# Patient Record
Sex: Female | Born: 1960
Health system: Southern US, Community
[De-identification: ages and names within clinical notes are randomized; demographics above are authoritative.]

## PROBLEM LIST (undated history)

## (undated) DIAGNOSIS — F419 Anxiety disorder, unspecified: Secondary | ICD-10-CM

## (undated) DIAGNOSIS — N170 Acute kidney failure with tubular necrosis: Secondary | ICD-10-CM

## (undated) DIAGNOSIS — I10 Essential (primary) hypertension: Secondary | ICD-10-CM

## (undated) DIAGNOSIS — E7800 Pure hypercholesterolemia, unspecified: Secondary | ICD-10-CM

## (undated) DIAGNOSIS — R55 Syncope and collapse: Secondary | ICD-10-CM

## (undated) DIAGNOSIS — M797 Fibromyalgia: Secondary | ICD-10-CM

## (undated) DIAGNOSIS — M549 Dorsalgia, unspecified: Secondary | ICD-10-CM

## (undated) HISTORY — PX: HAND SURGERY: SHX662

## (undated) HISTORY — DX: Essential (primary) hypertension: I10

## (undated) HISTORY — PX: OOPHORECTOMY: SHX86

## (undated) HISTORY — DX: Acute kidney failure with tubular necrosis: N17.0

## (undated) HISTORY — DX: Fibromyalgia: M79.7

## (undated) HISTORY — DX: Syncope and collapse: R55

## (undated) HISTORY — DX: Pure hypercholesterolemia, unspecified: E78.00

## (undated) HISTORY — DX: Dorsalgia, unspecified: M54.9

---

## 1989-02-24 HISTORY — PX: TUBAL LIGATION: SHX77

## 1999-07-11 ENCOUNTER — Encounter: Payer: Self-pay | Admitting: Emergency Medicine

## 1999-07-11 ENCOUNTER — Emergency Department (HOSPITAL_COMMUNITY): Admission: EM | Admit: 1999-07-11 | Discharge: 1999-07-11 | Payer: Self-pay | Admitting: Emergency Medicine

## 1999-12-12 ENCOUNTER — Other Ambulatory Visit: Admission: RE | Admit: 1999-12-12 | Discharge: 1999-12-12 | Payer: Self-pay | Admitting: Unknown Physician Specialty

## 2003-04-07 ENCOUNTER — Encounter: Admission: RE | Admit: 2003-04-07 | Discharge: 2003-04-07 | Payer: Self-pay | Admitting: Internal Medicine

## 2003-05-22 ENCOUNTER — Emergency Department (HOSPITAL_COMMUNITY): Admission: EM | Admit: 2003-05-22 | Discharge: 2003-05-22 | Payer: Self-pay | Admitting: Family Medicine

## 2003-05-23 ENCOUNTER — Encounter: Admission: RE | Admit: 2003-05-23 | Discharge: 2003-05-23 | Payer: Self-pay | Admitting: Internal Medicine

## 2003-10-16 ENCOUNTER — Emergency Department (HOSPITAL_COMMUNITY): Admission: EM | Admit: 2003-10-16 | Discharge: 2003-10-16 | Payer: Self-pay | Admitting: Emergency Medicine

## 2003-11-18 ENCOUNTER — Emergency Department (HOSPITAL_COMMUNITY): Admission: EM | Admit: 2003-11-18 | Discharge: 2003-11-18 | Payer: Self-pay | Admitting: Emergency Medicine

## 2004-04-10 ENCOUNTER — Other Ambulatory Visit: Admission: RE | Admit: 2004-04-10 | Discharge: 2004-04-10 | Payer: Self-pay | Admitting: Internal Medicine

## 2004-05-26 ENCOUNTER — Emergency Department (HOSPITAL_COMMUNITY): Admission: EM | Admit: 2004-05-26 | Discharge: 2004-05-26 | Payer: Self-pay | Admitting: Family Medicine

## 2004-07-26 ENCOUNTER — Emergency Department (HOSPITAL_COMMUNITY): Admission: EM | Admit: 2004-07-26 | Discharge: 2004-07-26 | Payer: Self-pay | Admitting: Emergency Medicine

## 2004-08-23 ENCOUNTER — Encounter: Admission: RE | Admit: 2004-08-23 | Discharge: 2004-08-23 | Payer: Self-pay | Admitting: Internal Medicine

## 2005-02-25 ENCOUNTER — Emergency Department (HOSPITAL_COMMUNITY): Admission: EM | Admit: 2005-02-25 | Discharge: 2005-02-25 | Payer: Self-pay | Admitting: Family Medicine

## 2005-04-09 ENCOUNTER — Emergency Department (HOSPITAL_COMMUNITY): Admission: EM | Admit: 2005-04-09 | Discharge: 2005-04-09 | Payer: Self-pay | Admitting: Emergency Medicine

## 2005-08-21 ENCOUNTER — Encounter: Admission: RE | Admit: 2005-08-21 | Discharge: 2005-08-21 | Payer: Self-pay | Admitting: Internal Medicine

## 2005-09-04 ENCOUNTER — Encounter: Admission: RE | Admit: 2005-09-04 | Discharge: 2005-09-04 | Payer: Self-pay | Admitting: Internal Medicine

## 2005-09-21 ENCOUNTER — Emergency Department (HOSPITAL_COMMUNITY): Admission: EM | Admit: 2005-09-21 | Discharge: 2005-09-21 | Payer: Self-pay | Admitting: Emergency Medicine

## 2005-11-22 ENCOUNTER — Ambulatory Visit (HOSPITAL_COMMUNITY): Admission: RE | Admit: 2005-11-22 | Discharge: 2005-11-22 | Payer: Self-pay | Admitting: Neurology

## 2005-11-26 ENCOUNTER — Other Ambulatory Visit: Admission: RE | Admit: 2005-11-26 | Discharge: 2005-11-26 | Payer: Self-pay | Admitting: Gynecology

## 2005-12-06 ENCOUNTER — Ambulatory Visit (HOSPITAL_COMMUNITY): Admission: RE | Admit: 2005-12-06 | Discharge: 2005-12-06 | Payer: Self-pay | Admitting: Neurology

## 2005-12-25 HISTORY — PX: HYSTEROSCOPY: SHX211

## 2006-01-19 ENCOUNTER — Encounter (INDEPENDENT_AMBULATORY_CARE_PROVIDER_SITE_OTHER): Payer: Self-pay | Admitting: Specialist

## 2006-01-19 ENCOUNTER — Ambulatory Visit (HOSPITAL_BASED_OUTPATIENT_CLINIC_OR_DEPARTMENT_OTHER): Admission: RE | Admit: 2006-01-19 | Discharge: 2006-01-19 | Payer: Self-pay | Admitting: Gynecology

## 2006-04-10 ENCOUNTER — Emergency Department (HOSPITAL_COMMUNITY): Admission: EM | Admit: 2006-04-10 | Discharge: 2006-04-10 | Payer: Self-pay | Admitting: Emergency Medicine

## 2006-05-01 ENCOUNTER — Encounter (INDEPENDENT_AMBULATORY_CARE_PROVIDER_SITE_OTHER): Payer: Self-pay | Admitting: Specialist

## 2006-05-01 ENCOUNTER — Ambulatory Visit (HOSPITAL_COMMUNITY): Admission: RE | Admit: 2006-05-01 | Discharge: 2006-05-01 | Payer: Self-pay | Admitting: Gastroenterology

## 2006-09-07 ENCOUNTER — Encounter: Admission: RE | Admit: 2006-09-07 | Discharge: 2006-09-07 | Payer: Self-pay | Admitting: Internal Medicine

## 2006-11-08 ENCOUNTER — Emergency Department (HOSPITAL_COMMUNITY): Admission: EM | Admit: 2006-11-08 | Discharge: 2006-11-08 | Payer: Self-pay | Admitting: Emergency Medicine

## 2006-12-04 ENCOUNTER — Other Ambulatory Visit: Admission: RE | Admit: 2006-12-04 | Discharge: 2006-12-04 | Payer: Self-pay | Admitting: Gynecology

## 2007-02-22 ENCOUNTER — Ambulatory Visit (HOSPITAL_COMMUNITY): Admission: RE | Admit: 2007-02-22 | Discharge: 2007-02-23 | Payer: Self-pay | Admitting: Gynecology

## 2007-02-22 ENCOUNTER — Encounter: Payer: Self-pay | Admitting: Gynecology

## 2007-06-30 ENCOUNTER — Encounter: Admission: RE | Admit: 2007-06-30 | Discharge: 2007-06-30 | Payer: Self-pay | Admitting: Internal Medicine

## 2007-11-09 ENCOUNTER — Encounter: Admission: RE | Admit: 2007-11-09 | Discharge: 2007-11-09 | Payer: Self-pay | Admitting: Gynecology

## 2007-12-31 ENCOUNTER — Other Ambulatory Visit: Admission: RE | Admit: 2007-12-31 | Discharge: 2007-12-31 | Payer: Self-pay | Admitting: Gynecology

## 2007-12-31 ENCOUNTER — Ambulatory Visit: Payer: Self-pay | Admitting: Gynecology

## 2007-12-31 ENCOUNTER — Encounter: Payer: Self-pay | Admitting: Gynecology

## 2008-01-25 HISTORY — PX: VAGINAL HYSTERECTOMY: SUR661

## 2008-02-08 ENCOUNTER — Ambulatory Visit: Payer: Self-pay | Admitting: Gynecology

## 2008-05-04 ENCOUNTER — Encounter: Admission: RE | Admit: 2008-05-04 | Discharge: 2008-05-04 | Payer: Self-pay | Admitting: Internal Medicine

## 2008-05-25 ENCOUNTER — Ambulatory Visit (HOSPITAL_COMMUNITY): Admission: RE | Admit: 2008-05-25 | Discharge: 2008-05-25 | Payer: Self-pay | Admitting: Internal Medicine

## 2008-05-25 ENCOUNTER — Ambulatory Visit: Payer: Self-pay | Admitting: Sports Medicine

## 2008-05-25 DIAGNOSIS — M84376A Stress fracture, unspecified foot, initial encounter for fracture: Secondary | ICD-10-CM

## 2008-05-25 DIAGNOSIS — M7710 Lateral epicondylitis, unspecified elbow: Secondary | ICD-10-CM | POA: Insufficient documentation

## 2008-05-25 HISTORY — DX: Stress fracture, unspecified foot, initial encounter for fracture: M84.376A

## 2008-06-16 ENCOUNTER — Ambulatory Visit (HOSPITAL_COMMUNITY): Admission: RE | Admit: 2008-06-16 | Discharge: 2008-06-16 | Payer: Self-pay | Admitting: Internal Medicine

## 2008-07-13 ENCOUNTER — Ambulatory Visit: Payer: Self-pay | Admitting: Sports Medicine

## 2008-08-03 ENCOUNTER — Ambulatory Visit: Payer: Self-pay | Admitting: Sports Medicine

## 2008-08-31 ENCOUNTER — Ambulatory Visit: Payer: Self-pay | Admitting: Sports Medicine

## 2008-10-19 ENCOUNTER — Ambulatory Visit: Payer: Self-pay | Admitting: Sports Medicine

## 2008-11-10 ENCOUNTER — Encounter: Admission: RE | Admit: 2008-11-10 | Discharge: 2008-11-10 | Payer: Self-pay | Admitting: Gynecology

## 2009-01-03 ENCOUNTER — Ambulatory Visit: Payer: Self-pay | Admitting: Gynecology

## 2009-01-03 ENCOUNTER — Encounter: Payer: Self-pay | Admitting: Gynecology

## 2009-01-03 ENCOUNTER — Other Ambulatory Visit: Admission: RE | Admit: 2009-01-03 | Discharge: 2009-01-03 | Payer: Self-pay | Admitting: Gynecology

## 2009-01-04 ENCOUNTER — Ambulatory Visit: Payer: Self-pay | Admitting: Sports Medicine

## 2009-03-06 ENCOUNTER — Emergency Department (HOSPITAL_COMMUNITY): Admission: EM | Admit: 2009-03-06 | Discharge: 2009-03-06 | Payer: Self-pay | Admitting: Emergency Medicine

## 2009-06-25 ENCOUNTER — Encounter: Admission: RE | Admit: 2009-06-25 | Discharge: 2009-07-03 | Payer: Self-pay | Admitting: Internal Medicine

## 2009-11-05 ENCOUNTER — Encounter: Admission: RE | Admit: 2009-11-05 | Discharge: 2009-11-23 | Payer: Self-pay | Admitting: Internal Medicine

## 2009-11-12 ENCOUNTER — Encounter: Admission: RE | Admit: 2009-11-12 | Discharge: 2009-11-12 | Payer: Self-pay | Admitting: Gynecology

## 2010-01-14 ENCOUNTER — Ambulatory Visit: Payer: Self-pay | Admitting: Gynecology

## 2010-01-14 ENCOUNTER — Other Ambulatory Visit: Admission: RE | Admit: 2010-01-14 | Discharge: 2010-01-14 | Payer: Self-pay | Admitting: Gynecology

## 2010-01-16 ENCOUNTER — Ambulatory Visit (HOSPITAL_COMMUNITY): Admission: RE | Admit: 2010-01-16 | Discharge: 2010-01-16 | Payer: Self-pay | Admitting: Gastroenterology

## 2010-03-17 ENCOUNTER — Encounter: Payer: Self-pay | Admitting: Internal Medicine

## 2010-06-04 ENCOUNTER — Inpatient Hospital Stay (INDEPENDENT_AMBULATORY_CARE_PROVIDER_SITE_OTHER)
Admission: RE | Admit: 2010-06-04 | Discharge: 2010-06-04 | Disposition: A | Discharge status: Home / Self Care | Payer: 59 | Source: Ambulatory Visit | Attending: Family Medicine | Admitting: Family Medicine

## 2010-06-04 ENCOUNTER — Ambulatory Visit (INDEPENDENT_AMBULATORY_CARE_PROVIDER_SITE_OTHER): Payer: 59

## 2010-06-04 DIAGNOSIS — M549 Dorsalgia, unspecified: Secondary | ICD-10-CM

## 2010-06-04 DIAGNOSIS — IMO0001 Reserved for inherently not codable concepts without codable children: Secondary | ICD-10-CM

## 2010-06-04 DIAGNOSIS — M799 Soft tissue disorder, unspecified: Secondary | ICD-10-CM

## 2010-07-09 NOTE — Op Note (Signed)
[name], [name]              ACCOUNT NO.:  [id number]   MEDICAL RECORD NO.:  [id number]          PATIENT TYPE:  AMB   LOCATION:  SDC                           FACILITY:  [facility]   PHYSICIAN:  Timothy P. Fontaine, M.D.DATE OF BIRTH:  10/16/1960   DATE OF PROCEDURE:  02/22/2007  DATE OF DISCHARGE:                               OPERATIVE REPORT   PREOPERATIVE DIAGNOSES:  Menorrhagia, dysmenorrhea, leiomyoma and  endometrial polyps.   POSTOPERATIVE DIAGNOSES:  Menorrhagia, dysmenorrhea, leiomyoma and  endometrial polyps.   PROCEDURE:  Laparoscopic-assisted vaginal hysterectomy, bilateral  salpingo-oophorectomy.   SURGEON:  Timothy P. Fontaine, M.D.   ASSISTANTGaetano Hawthorne. Lily Peer, M.D.   ANESTHESIA:  General.   ESTIMATED BLOOD LOSS:  Less than 100 mL.   COMPLICATIONS:  None.   SPECIMEN:  Uterus, bilateral fallopian tubes, bilateral ovaries.   FINDINGS:  EUA external BUS vagina normal.  The cervix grossly normal.  Bimanual uterus enlarged, midline and mobile.  Adnexa without gross  masses.  Laparoscopic uterus enlarged, boggy.  Right and left fallopian  tubes normal length caliber fimbriated ends.  Right and left ovaries  grossly normal, free and mobile.  The pelvis without evidence of  endometriosis or pelvic adhesive disease.  Upper abdominal exam is  grossly normal.  The appendix was noted to be normal, free and mobile.  Liver is smooth.  The gallbladder not visualized.   PROCEDURE:  The patient was taken to the operating room, underwent  general anesthesia and was placed in the low dorsal lithotomy position.  The patient received abdominal perineal vaginal preparation with  Betadine solution per nursing personnel.  An indwelling Foley catheter  was placed in sterile technique.  EUA was performed.  The Hulka  tenaculum was placed through the cervix and the patient was draped in  the usual fashion.  A transverse infraumbilical incision was made.  The  10-mm OptiView  direct entry laparoscope was then placed under direct  visualization without difficulty and the abdomen was subsequently  insufflated without difficulty.  Right and left 5-mm suprapubic ports  were then placed under direct visualization, after transillumination for  the vessels without difficulty.  Examination of pelvic organs, upper  abdominal exam was carried out with findings noted above.  Using the  harmonic scalpel, the right infundibulopelvic ligament and vessels were  identified.  The pelvic sidewall anatomy reviewed.  The ureter was found  to be far from the operative field and the infundibulopelvic ligament  and vessels were then transected and after two clipping passes with the  harmonic scalpel.  The right adnexa was then freed from its attachments  with the harmonic scalpel, without difficulty to the level of the round  ligament.  The round ligament was then transected and the anterior  vesicouterine peritoneal fold was sharply developed to the midline.  A  similar procedure was carried out on the other side.  Attention was then  turned to the vaginal portion of the case.  The patient was placed in  the high dorsal lithotomy position.  The Hulka tenaculum was removed.  The cervix visualized with a  weighted speculum.  Cervix grasped with  tenaculum and the cervical mucosa was circumferentially injected using  lidocaine epinephrine mixture.  A total of 10 mL.  The cervical mucosa  was then circumferentially sharply incised and paracervical planes  sharply developed.  The posterior cul-de-sac was sharply entered.  A  long weighted speculum was placed.  The right and left uterosacral  ligaments were then identified, clamped, cut and ligated using 0 Vicryl  suture and tagged for future reference.  Uterus was progressively freed  from its attachments through development of the vesicouterine plane and  clamping, cutting and ligating of the paracervical parametrial tissues  using 0  Vicryl suture.  Right and left the uterine vessels were noted  during this process and were ligated using 0 Vicryl suture.  At this  point, the uterus was then delivered through the vagina and any  remaining parametrial tissues were clamped, cut and subsequently ligated  using 0 Vicryl suture and the uterus was freed from the patient, along  with the fallopian tube and ovary bilaterally.  The long weighted  speculum was replaced with a short weighted speculum.  The posterior  vaginal cuff grasped with an Allis clamp.  The intestines packed with a  tagged tail sponge.  The pelvis was irrigated.  Adequate hemostasis was  grossly visualized.  The posterior vaginal mucosa was then run from  uterosacral ligament to uterosacral ligament, using 0 Vicryl suture in a  running interlocking stitch.  The tail sponge was removed and the vagina  was closed anterior-posterior using 0 Vicryl suture in figure-of-eight  interrupted stitch.  The vagina was irrigated.  Hemostasis visualized.  The urine noted to be clear yellow free-flowing.  Attention was then  redirected to the laparoscopic portion.  The abdomen was reinsufflated.  The pelvis was copiously irrigated.  Several small oozing points, at the  peritoneal surfaces, at the cuff were bipolar cauterized.  Surgicel was  placed at the cuff to assure continued hemostasis.  The pressure was  slowly allowed to escape.  Adequate hemostasis was visualized at a low-  pressure situation.  Both suprapubic 5-mm ports were then removed.  Again, hemostasis was visualized and the infraumbilical port was then  backed out under direct visualization, showing adequate hemostasis and  no evidence of hernia formation.  A 0 Vicryl interrupted subcutaneous  fascial stitch was placed infraumbilically.  All skin incisions were  injected using 0.25% Marcaine and all skin incisions closed using 4-0  Vicryl in an interrupted cuticular stitch.  Sterile dressings were  applied.   The patient was placed in a supine position and awakened  without difficulty and taken to the recovery room in good condition,  having tolerated the procedure well.      Timothy P. Fontaine, M.D.  Electronically Signed     TPF/MEDQ  D:  02/22/2007  T:  02/22/2007  Job:  [id number]

## 2010-07-09 NOTE — H&P (Signed)
[name], [name]              ACCOUNT NO.:  [id number]   MEDICAL RECORD NO.:  [id number]          PATIENT TYPE:  AMB   LOCATION:  SDC                           FACILITY:  [facility]   PHYSICIAN:  Timothy P. Fontaine, M.D.DATE OF BIRTH:  02/11/1961   DATE OF ADMISSION:  DATE OF DISCHARGE:                              HISTORY & PHYSICAL   REASON FOR ADMISSION:  The patient is being admitted to [facility]  February 22, 2007 at 7:30 a.m. for surgery.   CHIEF COMPLAINT:  Increasing menorrhagia, dysmenorrhea and recurrent  endometrial polyps.   HISTORY OF PRESENT ILLNESS:  A 50 year old G6, P4, AB 2 female with a  history of increasing menorrhagia and dysmenorrhea.  She underwent  sonohystogram which confirmed leiomyoma as well as 2 endometrial polyps.  She is admitted at this time for LAVH/BSO.  Patient is being followed  for hypertension, hypercholesterolemia, fibromyalgia.  She has undergone  a preoperative evaluation and clearance by her primary Dr. Allyne Gee and  her Dr. Jacinto Halim at  [facility].   PAST MEDICAL HISTORY:  1. Hypercholesterolemia.  2. Hypertension.  3. Fibromyalgia.   PAST SURGICAL HISTORY:  1. Hysteroscopy.  2. D&C.  3. Tubal sterilization.   CURRENT MEDICATIONS:  1. Cymbalta.  2. Crestor.  3. Lisinopril.  Dosages per her medication reconciliation sheet.   ALLERGIES:  NO MEDICATIONS.   SOCIAL HISTORY:  Significant for 1 pack per day cigarette smoking x 20  years.   FAMILY HISTORY:  Noncontributory   REVIEW OF SYSTEMS:  Noncontributory.   PHYSICAL EXAMINATION:  VITAL SIGNS:  Stable.  Afebrile.  HEENT:  Normal.  LUNGS:  Clear.  CARDIAC:  Regular rate.  No rubs, murmurs or gallops.  ABDOMINAL:  Benign.  PELVIC:  External BUS, vagina normal.  Cervix normal.  Uterus normal  size, midline mobile, nontender.  Adnexa without masses or tenderness.   ASSESSMENT:  A 50 year old G6, P4, AB 2 female increasing menorrhagia,  dysmenorrhea,  recurrent endometrial polyps for LAVH/BSO.  The risks,  benefits, indications and alternatives for the surgery were reviewed  with the patient.  More conservative options were reviewed and she wants  to proceed with hysterectomy.  Ovarian conservation issue was discussed.  The patient understands by removing both ovaries will remove estrogen  hormone production.  The symptoms and potential for hypoestrogenism long-  term risks were reviewed with her.  The patient understands she may  become significantly symptomatic. The issues of ERT were discussed with  her.  The risks and benefits of WHI study, increased risk of stroke,  heart attack, DVT, as well as possible breast cancer issues associated  with ERT were all reviewed, understood and accepted, and she would start  ERT if symptoms warrant.  Options for keeping both ovaries were  discussed.  The risks in keeping both ovaries to include benign ovarian  disease in the future as well as ovarian cancer weighed against the  continued hormone production was discussed and again she wants to have  both ovaries removed.  Absolute irreversible sterility associated with  hysterectomy was also discussed, understood and accepted.  Sexuality  associated with hysterectomy was reviewed and the risk for persistent  orgasmic dysfunction or persistent dyspareunia following the procedure  was discussed, understood and accepted.   The intraoperative course was reviewed with her.  We will plan on an  LAVH and the instrumentation to include trocar placement, multiple port  sites, insufflation was reviewed.  She understands that any time during  the procedure that we may abandon the laparoscopic approach if  complications arise or if felt unsafe to proceed and that we may switch  to an abdominal hysterectomy.  She understands and accepts this.  The  risks of hysterectomy acutely were reviewed to include the general risks  of surgery including deep venous  thrombosis, pulmonary embolus, stroke,  heart attack in the perioperative period as well as the risks of the  surgery to include infection, prolonged antibiotics, abscess formation  requiring re-operation, abscess drainage, wound complications both  abscess formation requiring opening and draining of incisions, closure  by secondary intention as well as long-term incisional risks such as  hernia formation.  The risk of hemorrhage necessitating transfusion and  risks of transfusion were reviewed to include transfusion reaction,  hepatitis, HIV, mad cow disease and other unknown entities.  The risks  of inadvertent injury to internal organs including bowel, bladder,  ureters, vessels and nerves were reviewed with the patient either  immediately recognized or delay recognized necessitating major  exploratory reparative surgeries, future reparative surgeries, bowel  resection, ostomy formation was all discussed, understood and accepted.  The patient's questions were answered to her satisfaction.  She is ready  to proceed with surgery.      Timothy P. Fontaine, M.D.  Electronically Signed     TPF/MEDQ  D:  02/12/2007  T:  02/13/2007  Job:  [id number]

## 2010-07-09 NOTE — Discharge Summary (Signed)
[name], [name]              ACCOUNT NO.:  [id number]   MEDICAL RECORD NO.:  [id number]          PATIENT TYPE:  OIB   LOCATION:  9317                          FACILITY:  [facility]   PHYSICIAN:  Timothy P. Fontaine, M.D.DATE OF BIRTH:  07-03-1960   DATE OF ADMISSION:  02/22/2007  DATE OF DISCHARGE:  02/23/2007                               DISCHARGE SUMMARY   DISCHARGE DIAGNOSES:  Menorrhagia, dysmenorrhea, leiomyoma, endometrial  polyps.   PROCEDURE:  LAVH BSO February 22, 2007, Dr. Colin Broach.  Pathology  pending.   HOSPITAL COURSE:  A 50 year old female underwent uncomplicated LAVH BSO  February 22, 2007.  Findings at the time of surgery was an enlarged  uterus with normal-appearing adnexa.  The patient's postoperative course  was uncomplicated.  She was discharged on postoperative day #1,  ambulating well, tolerating a regular diet with a postoperative  hemoglobin of 11.2.  The patient received precautions, instructions and  follow-up and will be seen in the office 2 weeks following discharge.  She received a prescription for Tylox #25 one to two p.o. q.4-6h p.r.n.  pain.      Timothy P. Fontaine, M.D.  Electronically Signed     TPF/MEDQ  D:  02/23/2007  T:  02/23/2007  Job:  [id number]

## 2010-07-12 NOTE — Op Note (Signed)
[name], [name]              ACCOUNT NO.:  [id number]   MEDICAL RECORD NO.:  [id number]          PATIENT TYPE:  AMB   LOCATION:  NESC                         FACILITY:  [facility]   PHYSICIAN:  Timothy P. Fontaine, M.D.DATE OF BIRTH:  04/10/1960   DATE OF PROCEDURE:  01/19/2006  DATE OF DISCHARGE:                                 OPERATIVE REPORT   PREOPERATIVE DIAGNOSIS:  Menorrhagia with endometrial polyps.   POSTOPERATIVE DIAGNOSIS:  Menorrhagia with endometrial polyps.   PROCEDURE:  1. Hysteroscopy.  2. Dilatation and curettage.  3. Paracervical block, 1% lidocaine.   SURGEON:  Timothy P. Fontaine, MD.   ANESTHETIC:  General with 1% lidocaine paracervical block.   SPECIMEN:  Endometrial curetting.   ESTIMATED BLOOD LOSS:  Minimal.   SORBITOL DISCREPANCY:  Approximately 150 mL.   COMPLICATIONS:  None.   FINDINGS:  EUA:  External, BUS, vagina normal.  Cervix normal.  Uterus  normal size, midline and mobile.  Adnexa without masses.  Hysteroscopic:  Undulating anterior endometrial thickening, no distinct polyps.  Fundus,  anterior-posterior surfaces, lower uterine segment, endocervical canal,  right and left tubal ostia all visualized.   PROCEDURE:  The patient was taken to the operating room, underwent general  anesthesia, was placed in the low dorsal lithotomy position, received a  perineal and vaginal preparation with Betadine solution.  Bladder emptied  with in-and-out Foley catheterization.  EUA performed.  The patient draped  in the usual fashion.  Cervix was visualized with a speculum, anterior lip  grasped with single-tooth tenaculum.  Paracervical block using 1% lidocaine  was placed, a total of 8 mL.  The cervix was gently gradually dilated to  admit the operative hysteroscope and hysteroscopy was performed with  findings noted above.  A sharp curettage was then performed.  The specimen  sent  to pathology.  Re-hysteroscopy showed good distension, no evidence  of  perforation, and an empty endometrial cavity.  The instruments were removed.  Hemostasis visualized.  The patient placed in supine position, awakened  without difficulty, and taken to the recovery room in good condition, having  tolerated the procedure well.      Timothy P. Fontaine, M.D.  Electronically Signed     TPF/MEDQ  D:  01/19/2006  T:  01/19/2006  Job:  [id number]
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
## 2010-07-12 NOTE — H&P (Signed)
[name], [name]              ACCOUNT NO.:  [id number]   MEDICAL RECORD NO.:  [id number]          PATIENT TYPE:  AMB   LOCATION:  NESC                         FACILITY:  [facility]   PHYSICIAN:  Timothy P. Fontaine, M.D.DATE OF BIRTH:  22-Nov-1960   DATE OF ADMISSION:  01/19/2006  DATE OF DISCHARGE:                                HISTORY & PHYSICAL   CHIEF COMPLAINT:  Menorrhagia.   HISTORY OF PRESENT ILLNESS:  A 50 year old G6, P4 female with a history of  increasing menorrhagia status post tubal sterilization.  The patient  underwent sonohystogram which showed anterior endometrial polyp and is  admitted at this time for hysteroscopic evaluation and resection of her  polyps, D&C.   PAST MEDICAL HISTORY:  Uncomplicated.   PAST SURGICAL HISTORY:  Tubal sterilization.   REVIEW OF SYSTEMS:  Noncontributory.   FAMILY HISTORY:  Noncontributory.   SOCIAL HISTORY:  Noncontributory.   PHYSICAL EXAMINATION:  VITAL SIGNS:  Afebrile.  Vital signs stable.  HEENT: Normal.  LUNGS:  Clear.  CARDIAC:  Regular rate, no murmurs, rubs, or gallops.  ABDOMEN:  Benign.  PELVIC:  External BUS and vagina normal.  Cervix normal.  Uterus normal  size, midline mobile, nontender.  Adnexa without mass or tenderness.   ASSESSMENT:  A 50 year old G6, P4 female status post tubal sterilization,  increasing menorrhagia, periods lasting 7 days, underwent sonohystogram  which showed several small myomas intramural as well as a irregular  endometrial cavity with a large anterior polyp measuring 14 x 14 x 11 mm.  She is admitted at this time for hysteroscopy D&C with removal of the  polyps.  I reviewed with the patient what is involved with the procedure,  expected intraoperative/postoperative courses, use of the hysteroscope and  the resectoscope.  I reviewed the risks of infection, possible subsequent  need for antibiotics.  The risks of hemorrhage necessitating transfusion and  risks of transfusion.  I  reviewed the risks of uterine perforation, internal  organ damage including bowel, bladder, ureters, vessels and nerves  necessitating major exploratory reparative surgeries and future reparative  surgeries including ostomy formation.  Either intermediate recognition or  delay recognition postop was discussed, understood and accepted.  The issues  with distended medium, absorption, metabolic complications, coma and  seizures was all discussed.  The  patient's questions were answered to her satisfaction.  She understands that  there are no guarantees as far as her menorrhagia relief as well as her  dysmenorrhea relief and she understands and accepts this.  The patient's  questions were answered and she is ready to proceed with surgery.      Timothy P. Fontaine, M.D.  Electronically Signed     TPF/MEDQ  D:  01/13/2006  T:  01/13/2006  Job:  [id number]

## 2010-07-12 NOTE — Op Note (Signed)
NAMEKENESHA, [name]              ACCOUNT NO.:  [id number]   MEDICAL RECORD NO.:  [id number]          PATIENT TYPE:  AMB   LOCATION:  ENDO                         FACILITY:  [facility]   PHYSICIAN:  Anselmo Rod, M.D.  DATE OF BIRTH:  12/06/1960   DATE OF PROCEDURE:  05/01/2006  DATE OF DISCHARGE:                               OPERATIVE REPORT   PROCEDURE PERFORMED:  Esophagogastroduodenoscopy with multiple cold  biopsies.   ENDOSCOPIST:  Anselmo Rod, MD   INSTRUMENT USED:  Pentax video panendoscope.   INDICATIONS FOR PROCEDURE:  [age]-year-old African American female  undergoing EGD for epigastric pain and difficulty swallowing, nausea and  longstanding history of reflux, rule out esophagitis, peptic ulcer  disease, Barrett's esophagus, etc.   PREPROCEDURE PREPARATION:  Informed consent was procured from the  patient.  The patient was fasted for 8 hours prior to the procedure.  Risks and benefits of the procedure were discussed with the patient in  great detail.   PREPROCEDURE PHYSICAL:  VITAL SIGNS:  The patient had stable vital  signs.  NECK:  Supple.  CHEST:  Clear to auscultation.  CARDIAC:  S1 and S2 regular.  ABDOMEN:  Soft with normal bowel sounds.  Epigastric tenderness on  palpation, no guarding or rebound, or rigidity.   DESCRIPTION OF PROCEDURE:  The patient was placed in the left lateral  decubitus position and sedated with 100 mcg of fentanyl and 7 mg of  Versed given intravenously in slow incremental doses. Once the patient  was adequately sedated and maintained on low-flow oxygen and continuous  cardiac monitoring, the Pentax video panendoscope was advanced through  the mouthpiece, over the tongue and into the esophagus under direct  vision.  The entire esophagus was widely patent with no evidence of  ring, stricture, esophagitis, etc. A small patch of pinkish mucosa was  noted above the Z-line that may represent a Barrett's esophagus; 1  biopsy was  taken from this area.  Retroflexion in the high cardia  revealed no abnormalities.  There was evidence of moderate diffuse  gastritis.  Antral biopsies were done to rule out presence of H. pylori  by Pathology.  The proximal small bowel appeared normal.  There was no  outlet obstruction.  The patient tolerated the procedure well without  immediate complications.   IMPRESSION:  1. Small patch of pinkish mucosa above the Z-line, biopsied to rule      out Barrett's esophagus.  2. Moderate diffuse gastritis, biopsies done for Helicobacter pylori.  3. Normal proximal small bowel.   RECOMMENDATIONS:  1. Await pathology results.  2. Continue PPIs.  3. Treat with antibiotics if H. pylori present on biopsies.  4. Outpatient followup in the next 2 weeks for further      recommendations.      Anselmo Rod, M.D.  Electronically Signed     JNM/MEDQ  D:  05/01/2006  T:  05/02/2006  Job:  [id number]   cc:   Candyce Churn. Allyne Gee, M.D.

## 2010-11-11 ENCOUNTER — Other Ambulatory Visit: Payer: Self-pay | Admitting: Gynecology

## 2010-11-11 DIAGNOSIS — Z1231 Encounter for screening mammogram for malignant neoplasm of breast: Secondary | ICD-10-CM

## 2010-11-15 ENCOUNTER — Ambulatory Visit
Admission: RE | Admit: 2010-11-15 | Discharge: 2010-11-15 | Disposition: A | Discharge status: Home / Self Care | Payer: 59 | Source: Ambulatory Visit | Attending: Gynecology | Admitting: Gynecology

## 2010-11-15 ENCOUNTER — Ambulatory Visit: Payer: 59

## 2010-11-15 DIAGNOSIS — Z1231 Encounter for screening mammogram for malignant neoplasm of breast: Secondary | ICD-10-CM

## 2010-11-29 LAB — COMPREHENSIVE METABOLIC PANEL
AST: 25
Albumin: 4.1
Calcium: 9.5
Creatinine, Ser: 0.59
GFR calc Af Amer: >60
Glucose, Bld: 126 — ABNORMAL HIGH
Sodium: 137
Total Bilirubin: 0.7
Total Protein: 7.3

## 2010-11-29 LAB — CBC
MCHC: 33.5
MCV: 86.6
MCV: 87.1
RBC: 3.87
RBC: 4.86
RDW: 13.2
WBC: 12.8 — ABNORMAL HIGH
WBC: 5.6

## 2010-11-29 LAB — PREGNANCY, URINE: Preg Test, Ur: NEGATIVE

## 2011-01-15 ENCOUNTER — Encounter: Payer: Self-pay | Admitting: *Deleted

## 2011-01-15 DIAGNOSIS — M797 Fibromyalgia: Secondary | ICD-10-CM | POA: Insufficient documentation

## 2011-01-24 ENCOUNTER — Encounter: Payer: Self-pay | Admitting: Gynecology

## 2011-01-24 ENCOUNTER — Ambulatory Visit (INDEPENDENT_AMBULATORY_CARE_PROVIDER_SITE_OTHER): Payer: 59 | Admitting: Gynecology

## 2011-01-24 VITALS — BP 112/74 | Ht 61.0 in | Wt 164.0 lb

## 2011-01-24 DIAGNOSIS — B373 Candidiasis of vulva and vagina: Secondary | ICD-10-CM

## 2011-01-24 DIAGNOSIS — Z01419 Encounter for gynecological examination (general) (routine) without abnormal findings: Secondary | ICD-10-CM

## 2011-01-24 MED ORDER — FLUCONAZOLE 150 MG PO TABS: 150.0000 mg | ORAL_TABLET | Freq: Once | ORAL | Status: AC

## 2011-01-24 NOTE — Patient Instructions (Signed)
Follow up for colonoscopy as recommended. Follow up for annual GYN exam in 1 year

## 2011-01-24 NOTE — Progress Notes (Signed)
Addended byCammie Mcgee T on: 01/24/2011 03:02 PM   Modules accepted: Orders

## 2011-01-24 NOTE — Progress Notes (Signed)
[name] February 23, 1961 [id number]        50 y.o.  for annual exam.  Doing well. She thinks is a yeast infection with some itching. She is status post LAVH BSO in the past having some hot flashes which are tolerable to her.  Past medical history,surgical history, medications, allergies, family history and social history were all reviewed and documented in the EPIC chart. ROS:  Was performed and pertinent positives and negatives are included in the history.  Exam: chaperone present Filed Vitals:   01/24/11 1356  BP: 112/74   General appearance  Normal Skin grossly normal Head/Neck normal with no cervical or supraclavicular adenopathy thyroid normal Lungs  clear Cardiac RR, without RMG Abdominal  soft, nontender, without masses, organomegaly or hernia Breasts  examined lying and sitting without masses, retractions, discharge or axillary adenopathy. Pelvic  Ext/BUS/vagina  normal with white discharge  Adnexa  Without masses or tenderness    Anus and perineum  normal   Rectovaginal  normal sphincter tone without palpated masses or tenderness.    Assessment/Plan:  50 y.o. female for annual exam.    1. White discharge. KOH prep is positive for yeast. We'll treat with Diflucan 150x1 dose I gave her a total of #4 pills to have available as she has recurrences. She is being treated for diabetes. 2. Pap smear. She is no history of abnormal Pap smears in the past with several normal reports in her chart the last in 2011. I reviewed current screening guidelines and she is status post hysterectomy options of stopping altogether or doing less frequent screening was reviewed, she prefers less frequent screening and we'll plan on an every 3 year screening. Pap was not done this year.  3. Breast health. SBE monthly reviewed.  She had mammography in September and will continue with annual mammography. 4. Bone density. She had a DEXA that was normal in 2009 and we'll plan on a five-year repeat that.  Increase calcium vitamin D reviewed. 5. Colonoscopy. She had a colonoscopy last year apparently a polyp since this to have them done this year and a gastric call Dr. Loreta Ave who did her colonoscopy to arrange a follow up. 6. Smoking. I encouraged smoking she is going with the nicotine patch now and we discussed various strategies and she is going to attempt to stop smoking this coming year. 7. Influenza vaccine. She's received her vaccine through [facility] although it is not documented in Epic. 8. Health maintenance. No blood work was done today as is all done through her primary who follows her for her medical issues. Assuming she continues well from a gynecologic standpoint she will see [place] in a year sooner as needed.    Dara Lords MD, 2:28 PM 01/24/2011

## 2011-04-10 ENCOUNTER — Encounter: Payer: Self-pay | Admitting: Dietician

## 2011-04-10 ENCOUNTER — Encounter: Payer: 59 | Attending: Internal Medicine | Admitting: Dietician

## 2011-04-10 DIAGNOSIS — Z713 Dietary counseling and surveillance: Secondary | ICD-10-CM | POA: Insufficient documentation

## 2011-04-10 DIAGNOSIS — E119 Type 2 diabetes mellitus without complications: Secondary | ICD-10-CM | POA: Insufficient documentation

## 2011-04-10 NOTE — Progress Notes (Signed)
  Medical Nutrition Therapy:  Appt start time: 1600 end time:  1700.  Assessment:  Primary concerns today: Getting back on track with diabetes.self-management.  Relates  Stress to the loss of a loved one and day to day issues.  Notes that she often gets in the pattern of eating the same foods, doesn't have the energy to prepare foods on a regular basis and then eats the same thing.  Getting into this pattern promotes boredom.  Has seen an increase in A1C and has gained some weight.  Currently checking blood glucose levels 2-3 times per week.  Rarely does a fasting level.  MEDICATIONS: Diabetes medications Actos/Metformin 5/500 mg in the evening and Victoza 1.2 mcg daily per injection.  DIETARY INTAKE:  24-hr recall:  B (6:00-6:30 AM): Coffee, (decaf) with Splenda or Agava nectar.(using but not so sure).  No food until 8:45 Biscuit ([employer] bacon/egg/cheese or Sausage/egg)  OR oatmeal (package 2 regular and a flavored mixed)  Water.  Eats breakfast 5 days per week.  Weekends grabs a bite when can while busy.  Snk (mid- AM) :fruit (apple, cantalope, water  L (1:00-2:00 PM): Leftovers or fast food.  [place] and cheese sandwich (white bread, mayo), chips and a slim Jim.  1/2 of regular coke.  Snk (mid PM): =/- 1-2 cookies or pork skins. D (7:00-7:30 PM): BBQ ribs 2 ribs , potato salad 1/2 cup, beans (pinto) 1/2 cup and turnip greens  1/2 cup.  Koolaid singles.  Snk (bedtime PM): none Beverages: coffee, water, soda, koolaid  Recent physical activity: Walking tape 2 times per week.  Aiming for three times per week for 30 minutes. Might benefit with a water exercise.     Estimated energy needs:Ht:61 in  WT: 161.9 lb  BMI:30.7%  Adjusted Weight:123 lb (56 kg)  1400 calories for some weight loss 155-160 g carbohydrates 105-110 g protein 37-39 g fat  Progress Towards Goal(s):  No progress.   Nutritional Diagnosis:  [place]-2.1 Inpaired nutrition utilization As related to glucose.  As evidenced by  diagnosis of type 2 diabetes and A1C of 6.4%..    Intervention:  Nutrition Look to use the 45 gm of carb per meal.  Plan to have a protein source at each meal and snack.  With the use of fats, try to stay in the monounsaturated fats rather than the polys or saturated fats.  Use the exchanges plus your labels for counting the carbs.  The calorieking.com web site is another that is helpful.  Consider saving money and trying to do the lower carb selections prepared at home and toted to work.  Work at getting the concentrated soda out of the die.  When using a sugar substitute, if you use the Agave Necator, use with meals only and count the carb that is in your serving..  Handouts given during visit include:  Novo Nordisk Carbohydrate Carb Counting Guide  Yellow card with diet prescription  Snack list  Mock menu for the 45 gm carb per meal  Monitoring/Evaluation:  Dietary intake, exercise, blood glucose monitoring, and body weight in the next 3-4 months.  I would like to do an body fat mass calculation to track with the weight loss.

## 2011-04-10 NOTE — Patient Instructions (Addendum)
   Consider water for exercise for fibromyalgia.  [facility] [place] [employer] be a Theatre stage manager for a pool.  Look for [facility] discount at the [facility].   Try the 45 gm of carb per meal and 15 gm for snacks.  Use the snack list.  Change up don't get bored.

## 2011-07-02 ENCOUNTER — Ambulatory Visit (HOSPITAL_COMMUNITY)
Admission: RE | Admit: 2011-07-02 | Discharge: 2011-07-02 | Disposition: A | Discharge status: Home / Self Care | Payer: 59 | Source: Ambulatory Visit | Attending: Family Medicine | Admitting: Family Medicine

## 2011-07-02 ENCOUNTER — Other Ambulatory Visit: Payer: Self-pay | Admitting: Family Medicine

## 2011-07-02 DIAGNOSIS — R0602 Shortness of breath: Secondary | ICD-10-CM

## 2011-07-02 DIAGNOSIS — R062 Wheezing: Secondary | ICD-10-CM | POA: Insufficient documentation

## 2011-07-02 DIAGNOSIS — R059 Cough, unspecified: Secondary | ICD-10-CM | POA: Insufficient documentation

## 2011-07-02 DIAGNOSIS — R05 Cough: Secondary | ICD-10-CM | POA: Insufficient documentation

## 2011-07-02 DIAGNOSIS — Z87891 Personal history of nicotine dependence: Secondary | ICD-10-CM | POA: Insufficient documentation

## 2011-08-25 ENCOUNTER — Encounter: Payer: Self-pay | Admitting: Family Medicine

## 2011-08-25 ENCOUNTER — Ambulatory Visit (INDEPENDENT_AMBULATORY_CARE_PROVIDER_SITE_OTHER): Payer: 59 | Admitting: Family Medicine

## 2011-08-25 VITALS — BP 116/81 | HR 84 | Ht 60.0 in | Wt 164.0 lb

## 2011-08-25 DIAGNOSIS — M25519 Pain in unspecified shoulder: Secondary | ICD-10-CM

## 2011-08-25 MED ORDER — TRAMADOL HCL 50 MG PO TABS
50.0000 mg | ORAL_TABLET | Freq: Three times a day (TID) | ORAL | Status: DC | PRN
Start: 2011-08-25 — End: 2011-09-01

## 2011-08-26 DIAGNOSIS — M25519 Pain in unspecified shoulder: Secondary | ICD-10-CM | POA: Insufficient documentation

## 2011-08-27 NOTE — Progress Notes (Signed)
  Subjective:    Patient ID: [name], female    DOB: [birth year], 51 y.o.   MRN: [id number]  HPI  Right shoulder pain for 3 weeks. Date of injury approximately August 10, 2011. Was playing with her grandson and reached up suddenly to catch a ball of her right hand when she felt a sharp tearing-type pain. She had to stop playing with her grandson. Since then she has had continued right shoulder pain occasionally radiates up into her neck and down into her right arm. It is worse if she tries to move her arm above shoulder level. Also bothers her quite a bit at night. She's been trying ibuprofen and that does not seem to help much.  PERTINENT  PMH / PSH: Diabetes mellitus Hyperlipidemia  Review of Systems Denies numbness or tingling in her right arm or hand. She has had shooting pain as described in history of present illness. Denies fever, sweats, chills. Denies headache.    Objective:   Physical Exam  Vital signs reviewed. GENERAL: Well developed, well nourished, no acute distress SHOULDER: Right. Full range of motion but pain with elevation above shoulder level in lateral abduction and forward flexion. Internal rotation decreased on the right compared with the left. On the left she can get to T10 with her hand behind her back and on the right she can only get to L1. The biceps tendon is nontender. The shoulders are symmetrical. Scapular motion is symmetrical. A little difficult to evaluate strength that she has some much pain with lateral abduction, supraspinatus testing, forward flexion. Distally she is neurovascularly intact in the hand and arm on the right. SKIN: No ecchymoses, no rash.  ULTRASOUND:  Small articular surface tear of the supraspinatus. It is not a full thickness tear. The rest of the rotator cuff muscles appear intact. The bicep tendon is located in the bicipital groove which is somewhat shallow. There is no effusion or fluid seen around the bicep  tendon.   INJECTION: Patient was given informed consent, signed copy in the chart. Appropriate time out was taken. Area prepped and draped in usual sterile fashion. 1 cc of methylprednisolone 40 mg/ml plus  4 cc of 1% lidocaine without epinephrine was injected into the right shoulder using a(n) [posterior approach. The patient tolerated the procedure well. There were no complications. Post procedure instructions were given.     Assessment & Plan:  Supraspinatus tear csi today and aggressive HEP. F/u 3 weeks--would consider NTG patch if she is not seeing some improvement

## 2011-09-01 ENCOUNTER — Telehealth: Payer: Self-pay | Admitting: *Deleted

## 2011-09-01 MED ORDER — MELOXICAM 15 MG PO TABS: 7.5000 mg | ORAL_TABLET | Freq: Two times a day (BID) | ORAL | Status: AC

## 2011-09-01 NOTE — Telephone Encounter (Signed)
Tramadol gives her headache I have no labs so will give 1 m mobic and then she will need labs

## 2011-09-22 ENCOUNTER — Ambulatory Visit: Payer: 59 | Admitting: Family Medicine

## 2011-10-13 ENCOUNTER — Ambulatory Visit: Payer: 59 | Admitting: Family Medicine

## 2012-01-12 ENCOUNTER — Other Ambulatory Visit: Payer: Self-pay | Admitting: Gynecology

## 2012-01-12 DIAGNOSIS — Z1231 Encounter for screening mammogram for malignant neoplasm of breast: Secondary | ICD-10-CM

## 2012-01-15 ENCOUNTER — Ambulatory Visit
Admission: RE | Admit: 2012-01-15 | Discharge: 2012-01-15 | Disposition: A | Discharge status: Home / Self Care | Payer: 59 | Source: Ambulatory Visit | Attending: Gynecology | Admitting: Gynecology

## 2012-01-15 DIAGNOSIS — Z1231 Encounter for screening mammogram for malignant neoplasm of breast: Secondary | ICD-10-CM

## 2012-02-23 ENCOUNTER — Encounter: Payer: Self-pay | Admitting: Gynecology

## 2012-02-23 ENCOUNTER — Ambulatory Visit (INDEPENDENT_AMBULATORY_CARE_PROVIDER_SITE_OTHER): Payer: 59 | Admitting: Gynecology

## 2012-02-23 VITALS — BP 136/84 | Ht 60.0 in | Wt 158.0 lb

## 2012-02-23 DIAGNOSIS — Z01419 Encounter for gynecological examination (general) (routine) without abnormal findings: Secondary | ICD-10-CM

## 2012-02-23 DIAGNOSIS — Z13220 Encounter for screening for lipoid disorders: Secondary | ICD-10-CM

## 2012-02-23 LAB — CBC WITH DIFFERENTIAL/PLATELET
Basophils Absolute: 0 K/uL (ref 0.0–0.1)
Basophils Relative: 0 % (ref 0–1)
Eosinophils Absolute: 0 K/uL (ref 0.0–0.7)
Eosinophils Relative: 0 % (ref 0–5)
HCT: 42.5 % (ref 36.0–46.0)
Hemoglobin: 14.5 g/dL (ref 12.0–15.0)
Lymphocytes Relative: 21 % (ref 12–46)
Lymphs Abs: 1.5 K/uL (ref 0.7–4.0)
MCH: 29.6 pg (ref 26.0–34.0)
MCHC: 34.1 g/dL (ref 30.0–36.0)
MCV: 86.7 fL (ref 78.0–100.0)
Monocytes Absolute: 0.3 K/uL (ref 0.1–1.0)
Monocytes Relative: 4 % (ref 3–12)
Neutro Abs: 5.1 K/uL (ref 1.7–7.7)
Neutrophils Relative %: 75 % (ref 43–77)
Platelets: 376 K/uL (ref 150–400)
RBC: 4.9 MIL/uL (ref 3.87–5.11)
RDW: 14 % (ref 11.5–15.5)
WBC: 6.9 K/uL (ref 4.0–10.5)

## 2012-02-23 LAB — LIPID PANEL
HDL: 64 mg/dL (ref >39)
LDL Cholesterol: 71 mg/dL (ref 0–99)
Triglycerides: 223 mg/dL — ABNORMAL HIGH (ref <150)
VLDL: 45 mg/dL — ABNORMAL HIGH (ref 0–40)

## 2012-02-23 LAB — COMPREHENSIVE METABOLIC PANEL
Alkaline Phosphatase: 90 U/L (ref 39–117)
BUN: 6 mg/dL (ref 6–23)
Creat: 0.68 mg/dL (ref 0.50–1.10)
Glucose, Bld: 130 mg/dL — ABNORMAL HIGH (ref 70–99)
Total Bilirubin: 0.4 mg/dL (ref 0.3–1.2)

## 2012-02-23 LAB — TSH: TSH: 0.607 u[IU]/mL (ref 0.350–4.500)

## 2012-02-23 MED ORDER — FLUCONAZOLE 150 MG PO TABS: 150.0000 mg | ORAL_TABLET | Freq: Once | ORAL | Status: DC

## 2012-02-23 NOTE — Progress Notes (Signed)
[name] May 19, 1960 [id number]        50 y.o.  [id number] for annual exam.    Past medical history,surgical history, medications, allergies, family history and social history were all reviewed and documented in the EPIC chart. ROS:  Was performed and pertinent positives and negatives are included in the history.  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 Pelvic  Ext/BUS/vagina  normal   Adnexa  Without masses or tenderness    Anus and perineum  normal   Rectovaginal  normal sphincter tone without palpated masses or tenderness.    Assessment/Plan:  51 y.o. [id number] female for annual exam.   1. History of TVH BSO. Having some hot flashes and sweats. We've discussed in the past and rediscussed options today and she is comfortable with following and does not want to discuss HRT. 2. Recurrent yeast vaginitis, currently asymptomatic. Diflucan 150 mg number for provided for the year to use when necessary. 3. Mammography November 2013. Continue with annual mammography. 4. Pap smear 2011. No Pap smear done today. Status post hysterectomy for benign indications. Stop screening versus less frequent interval reviewed. Patient wants to continue to be screened and will plan next year at a 3 year interval. 5. DEXA 2009 normal. Plan repeat a 5 year interval. Increase calcium vitamin D reviewed. 6. Colonoscopy 2011. Follow up with their recommended interval. 7. Stop smoking discussed with strategies reviewed. 8. Marginal blood pressure 136/84. Patient will follow and if remains elevated will  follow up with her primary physician. 9. Health maintenance. Patient has to have her routine blood work and today she is scheduled to see her primary physician in several weeks and wants to have this available. CBC comprehensive metabolic panel lipid profile TSH vitamin D urinalysis ordered. Follow up one year, sooner as needed.    Dara Lords MD, 11:35 AM 02/23/2012

## 2012-02-23 NOTE — Patient Instructions (Signed)
Consider Stop Smoking.  Help is available at [facility]'s smoking cessation program @ www.[facility].com or 336-832-0838. OR 1-800-QUIT-NOW (1-800-784-8669) for free smoking cessation counseling.  Smokefree.gov (http://www.smokefree.gov) provides free, accurate, evidence-based information and professional assistance to help support the immediate and long-term needs of people trying to quit smoking.    Smoking Hazards Smoking cigarettes is extremely bad for your health. Tobacco smoke has over 200 known poisons in it. There are over 60 chemicals in tobacco smoke that cause cancer. Some of the chemicals found in cigarette smoke include:  Cyanide.  Benzene.  Formaldehyde.  Methanol (wood alcohol).  Acetylene (fuel used in welding torches).  Ammonia.  Cigarette smoke also contains the poisonous gases nitrogen oxide and carbon monoxide.  Cigarette smokers have an increased risk of many serious medical problems, including: Lung cancer.  Lung disease (such as pneumonia, bronchitis, and emphysema).  Heart attack and chest pain due to the heart not getting enough oxygen (angina).  Heart disease and peripheral blood vessel disease.  Hypertension.  Stroke.  Oral cancer (cancer of the lip, mouth, or voice box).  Bladder cancer.  Pancreatic cancer.  Cervical cancer.  Pregnancy complications, including premature birth.  Low birthweight babies.  Early menopause.  Lower estrogen level for women.  Infertility.  Facial wrinkles.  Blindness.  Increased risk of broken bones (fractures).  Senile dementia.  Stillbirths and smaller newborn babies, birth defects, and genetic damage to sperm.  Stomach ulcers and internal bleeding.  Children of smokers have an increased risk of the following, because of secondhand smoke exposure:  Sudden infant death syndrome (SIDS).  Respiratory infections.  Lung cancer.  Heart disease.  Ear infections.  Smoking causes approximately: 90% of all lung cancer  deaths in men.  80% of all lung cancer deaths in women.  90% of deaths from chronic obstructive lung disease.  Compared with nonsmokers, smoking increases the risk of: Coronary heart disease by 2 to 4 times.  Stroke by 2 to 4 times.  Men developing lung cancer by 23 times.  Women developing lung cancer by 13 times.  Dying from chronic obstructive lung diseases by 12 times.  Someone who smokes 2 packs a day loses about 8 years of his or her life. Even smoking lightly shortens your life expectancy by several years. You can greatly reduce the risk of medical problems for you and your family by stopping now. Smoking is the most preventable cause of death and disease in our society. Within days of quitting smoking, your circulation returns to normal, you decrease the risk of having a heart attack, and your lung capacity improves. There may be some increased phlegm in the first few days after quitting, and it may take months for your lungs to clear up completely. Quitting for 10 years cuts your lung cancer risk to almost that of a nonsmoker. WHY IS SMOKING ADDICTIVE? Nicotine is the chemical agent in tobacco that is capable of causing addiction or dependence.  When you smoke and inhale, nicotine is absorbed rapidly into the bloodstream through your lungs. Nicotine absorbed through the lungs is capable of creating a powerful addiction. Both inhaled and non-inhaled nicotine may be addictive.  Addiction studies of cigarettes and spit tobacco show that addiction to nicotine occurs mainly during the teen years, when young people begin using tobacco products.  WHAT ARE THE BENEFITS OF QUITTING?  There are many health benefits to quitting smoking.  Likelihood of developing cancer and heart disease decreases. Health improvements are seen almost immediately.    Blood pressure, pulse rate, and breathing patterns start returning to normal soon after quitting.  People who quit may see an improvement in their overall  quality of life.  Some people choose to quit all at once. Other options include nicotine replacement products, such as patches, gum, and nasal sprays. Do not use these products without first checking with your caregiver. QUITTING SMOKING It is not easy to quit smoking. Nicotine is addicting, and longtime habits are hard to change. To start, you can write down all your reasons for quitting, tell your family and friends you want to quit, and ask for their help. Throw your cigarettes away, chew gum or cinnamon sticks, keep your hands busy, and drink extra water or juice. Go for walks and practice deep breathing to relax. Think of all the money you are saving: around $1,000 a year, for the average pack-a-day smoker. Nicotine patches and gum have been shown to improve success at efforts to stop smoking. Zyban (bupropion) is an anti-depressant drug that can be prescribed to reduce nicotine withdrawal symptoms and to suppress the urge to smoke. Smoking is an addiction with both physical and psychological effects. Joining a stop-smoking support group can help you cope with the emotional issues. For more information and advice on programs to stop smoking, call your doctor, your local hospital, or these organizations: American [employer] Association - 1-800-LUNGUSA   Smoking Cessation  This document explains the best ways for you to quit smoking and new treatments to help. It lists new medicines that can double or triple your chances of quitting and quitting for good. It also considers ways to avoid relapses and concerns you may have about quitting, including weight gain. NICOTINE: A POWERFUL ADDICTION If you have tried to quit smoking, you know how hard it can be. It is hard because nicotine is a very addictive drug. For some people, it can be as addictive as heroin or cocaine. Usually, people make 2 or 3 tries, or more, before finally being able to quit. Each time you try to quit, you can learn about what helps and  what hurts. Quitting takes hard work and a lot of effort, but you can quit smoking. QUITTING SMOKING IS ONE OF THE MOST IMPORTANT THINGS YOU WILL EVER DO.  You will live longer, feel better, and live better.   The impact on your body of quitting smoking is felt almost immediately:   Within 20 minutes, blood pressure decreases. Pulse returns to its normal level.   After 8 hours, carbon monoxide levels in the blood return to normal. Oxygen level increases.   After 24 hours, chance of heart attack starts to decrease. Breath, hair, and body stop smelling like smoke.   After 48 hours, damaged nerve endings begin to recover. Sense of taste and smell improve.   After 72 hours, the body is virtually free of nicotine. Bronchial tubes relax and breathing becomes easier.   After 2 to 12 weeks, lungs can hold more air. Exercise becomes easier and circulation improves.   Quitting will reduce your risk of having a heart attack, stroke, cancer, or lung disease:   After 1 year, the risk of coronary heart disease is cut in half.   After 5 years, the risk of stroke falls to the same as a nonsmoker.   After 10 years, the risk of lung cancer is cut in half and the risk of other cancers decreases significantly.   After 15 years, the risk of coronary heart disease drops, usually   to the level of a nonsmoker.   If you are pregnant, quitting smoking will improve your chances of having a healthy baby.   The people you live with, especially your children, will be healthier.   You will have extra money to spend on things other than cigarettes.  FIVE KEYS TO QUITTING Studies have shown that these 5 steps will help you quit smoking and quit for good. You have the best chances of quitting if you use them together: 1. Get ready.  2. Get support and encouragement.  3. Learn new skills and behaviors.  4. Get medicine to reduce your nicotine addiction and use it correctly.  5. Be prepared for relapse or  difficult situations. Be determined to continue trying to quit, even if you do not succeed at first.  1. GET READY  Set a quit date.   Change your environment.   Get rid of ALL cigarettes, ashtrays, matches, and lighters in your home, car, and place of work.   Do not let people smoke in your home.   Review your past attempts to quit. Think about what worked and what did not.   Once you quit, do not smoke. NOT EVEN A PUFF!  2. GET SUPPORT AND ENCOURAGEMENT Studies have shown that you have a better chance of being successful if you have help. You can get support in many ways.  Tell your family, friends, and coworkers that you are going to quit and need their support. Ask them not to smoke around you.   Talk to your caregivers (doctor, dentist, nurse, pharmacist, psychologist, and/or smoking counselor).   Get individual, group, or telephone counseling and support. The more counseling you have, the better your chances are of quitting. Programs are available at local hospitals and health centers. Call your local health department for information about programs in your area.   Spiritual beliefs and practices may help some smokers quit.   Quit meters are small computer programs online or downloadable that keep track of quit statistics, such as amount of "quit-time," cigarettes not smoked, and money saved.   Many smokers find one or more of the many self-help books available useful in helping them quit and stay off tobacco.  3. LEARN NEW SKILLS AND BEHAVIORS  Try to distract yourself from urges to smoke. Talk to someone, go for a walk, or occupy your time with a task.   When you first try to quit, change your routine. Take a different route to work. Drink tea instead of coffee. Eat breakfast in a different place.   Do something to reduce your stress. Take a hot bath, exercise, or read a book.   Plan something enjoyable to do every day. Reward yourself for not smoking.   Explore  interactive web-based programs that specialize in helping you quit.  4. GET MEDICINE AND USE IT CORRECTLY Medicines can help you stop smoking and decrease the urge to smoke. Combining medicine with the above behavioral methods and support can quadruple your chances of successfully quitting smoking. The [place] Food and Drug Administration (FDA) has approved 7 medicines to help you quit smoking. These medicines fall into 3 categories.  Nicotine replacement therapy (delivers nicotine to your body without the negative effects and risks of smoking):   Nicotine gum: Available over-the-counter.   Nicotine lozenges: Available over-the-counter.   Nicotine inhaler: Available by prescription.   Nicotine nasal spray: Available by prescription.   Nicotine skin patches (transdermal): Available by prescription and over-the-counter.   Antidepressant medicine (  helps people abstain from smoking, but how this works is unknown):   Bupropion sustained-release (SR) tablets: Available by prescription.   Nicotinic receptor partial agonist (simulates the effect of nicotine in your brain):   Varenicline tartrate tablets: Available by prescription.   Ask your caregiver for advice about which medicines to use and how to use them. Carefully read the information on the package.   Everyone who is trying to quit may benefit from using a medicine. If you are pregnant or trying to become pregnant, nursing an infant, you are under age 18, or you smoke fewer than 10 cigarettes per day, talk to your caregiver before taking any nicotine replacement medicines.   You should stop using a nicotine replacement product and call your caregiver if you experience nausea, dizziness, weakness, vomiting, fast or irregular heartbeat, mouth problems with the lozenge or gum, or redness or swelling of the skin around the patch that does not go away.   Do not use any other product containing nicotine while using a nicotine replacement  product.   Talk to your caregiver before using these products if you have diabetes, heart disease, asthma, stomach ulcers, you had a recent heart attack, you have high blood pressure that is not controlled with medicine, a history of irregular heartbeat, or you have been prescribed medicine to help you quit smoking.  5. BE PREPARED FOR RELAPSE OR DIFFICULT SITUATIONS  Most relapses occur within the first 3 months after quitting. Do not be discouraged if you start smoking again. Remember, most people try several times before they finally quit.   You may have symptoms of withdrawal because your body is used to nicotine. You may crave cigarettes, be irritable, feel very hungry, cough often, get headaches, or have difficulty concentrating.   The withdrawal symptoms are only temporary. They are strongest when you first quit, but they will go away within 10 to 14 days.  Here are some difficult situations to watch for:  Alcohol. Avoid drinking alcohol. Drinking lowers your chances of successfully quitting.   Caffeine. Try to reduce the amount of caffeine you consume. It also lowers your chances of successfully quitting.   Other smokers. Being around smoking can make you want to smoke. Avoid smokers.   Weight gain. Many smokers will gain weight when they quit, usually less than 10 pounds. Eat a healthy diet and stay active. Do not let weight gain distract you from your main goal, quitting smoking. Some medicines that help you quit smoking may also help delay weight gain. You can always lose the weight gained after you quit.   Bad mood or depression. There are a lot of ways to improve your mood other than smoking.  If you are having problems with any of these situations, talk to your caregiver. SPECIAL SITUATIONS AND CONDITIONS Studies suggest that everyone can quit smoking. Your situation or condition can give you a special reason to quit.  Pregnant women/new mothers: By quitting, you protect your  baby's health and your own.   Hospitalized patients: By quitting, you reduce health problems and help healing.   Heart attack patients: By quitting, you reduce your risk of a second heart attack.   Lung, head, and neck cancer patients: By quitting, you reduce your chance of a second cancer.   Parents of children and adolescents: By quitting, you protect your children from illnesses caused by secondhand smoke.  QUESTIONS TO THINK ABOUT Think about the following questions before you try to stop smoking. You may   want to talk about your answers with your caregiver.  Why do you want to quit?   If you tried to quit in the past, what helped and what did not?   What will be the most difficult situations for you after you quit? How will you plan to handle them?   Who can help you through the tough times? Your family? Friends? Caregiver?   What pleasures do you get from smoking? What ways can you still get pleasure if you quit?  Here are some questions to ask your caregiver:  How can you help me to be successful at quitting?   What medicine do you think would be best for me and how should I take it?   What should I do if I need more help?   What is smoking withdrawal like? How can I get information on withdrawal?  Quitting takes hard work and a lot of effort, but you can quit smoking.  

## 2012-02-24 ENCOUNTER — Encounter: Payer: Self-pay | Admitting: Gynecology

## 2012-02-24 LAB — URINALYSIS W MICROSCOPIC + REFLEX CULTURE
Bacteria, UA: NONE SEEN
Bilirubin Urine: NEGATIVE
Casts: NONE SEEN
Hgb urine dipstick: NEGATIVE
Ketones, ur: NEGATIVE mg/dL
Nitrite: NEGATIVE
Urobilinogen, UA: 0.2 mg/dL (ref 0.0–1.0)

## 2012-03-16 ENCOUNTER — Ambulatory Visit
Admission: RE | Admit: 2012-03-16 | Discharge: 2012-03-16 | Disposition: A | Discharge status: Home / Self Care | Payer: 59 | Source: Ambulatory Visit | Attending: Internal Medicine | Admitting: Internal Medicine

## 2012-03-16 ENCOUNTER — Other Ambulatory Visit: Payer: Self-pay | Admitting: Internal Medicine

## 2012-03-16 DIAGNOSIS — R05 Cough: Secondary | ICD-10-CM

## 2012-03-16 DIAGNOSIS — F17200 Nicotine dependence, unspecified, uncomplicated: Secondary | ICD-10-CM

## 2012-03-16 DIAGNOSIS — R0602 Shortness of breath: Secondary | ICD-10-CM

## 2012-08-05 ENCOUNTER — Emergency Department (HOSPITAL_COMMUNITY)
Admission: EM | Admit: 2012-08-05 | Discharge: 2012-08-05 | Disposition: A | Discharge status: Home / Self Care | Payer: 59 | Source: Home / Self Care | Attending: Emergency Medicine | Admitting: Emergency Medicine

## 2012-08-05 ENCOUNTER — Emergency Department (INDEPENDENT_AMBULATORY_CARE_PROVIDER_SITE_OTHER): Payer: 59

## 2012-08-05 ENCOUNTER — Encounter (HOSPITAL_COMMUNITY): Payer: Self-pay | Admitting: Emergency Medicine

## 2012-08-05 DIAGNOSIS — J209 Acute bronchitis, unspecified: Secondary | ICD-10-CM

## 2012-08-05 MED ORDER — ALBUTEROL SULFATE HFA 108 (90 BASE) MCG/ACT IN AERS: 1.0000 | INHALATION_SPRAY | Freq: Four times a day (QID) | RESPIRATORY_TRACT | Status: DC | PRN

## 2012-08-05 MED ORDER — BENZONATATE 200 MG PO CAPS: 200.0000 mg | ORAL_CAPSULE | Freq: Three times a day (TID) | ORAL | Status: DC | PRN

## 2012-08-05 MED ORDER — IBUPROFEN 800 MG PO TABS
800.0000 mg | ORAL_TABLET | Freq: Once | ORAL | Status: AC
  Administered 2012-08-05: 800 mg via ORAL

## 2012-08-05 MED ORDER — HYDROCODONE-ACETAMINOPHEN 5-325 MG PO TABS: ORAL_TABLET | ORAL | Status: DC

## 2012-08-05 MED ORDER — IBUPROFEN 800 MG PO TABS
ORAL_TABLET | ORAL | Status: AC
  Filled 2012-08-05: qty 1

## 2012-08-05 MED ORDER — BECLOMETHASONE DIPROPIONATE 80 MCG/ACT IN AERS: 2.0000 | INHALATION_SPRAY | Freq: Two times a day (BID) | RESPIRATORY_TRACT | Status: DC

## 2012-08-05 MED ORDER — AMOXICILLIN-POT CLAVULANATE 875-125 MG PO TABS: 1.0000 | ORAL_TABLET | Freq: Two times a day (BID) | ORAL | Status: DC

## 2012-08-05 NOTE — ED Provider Notes (Signed)
Chief Complaint:   Chief Complaint  Patient presents with  . Chest Pain    denies sob. hx bronchitis. night sweats and nausea.   . Back Pain    History of Present Illness:   [name] is a 52 year old female smoker who presents with a two-day history of cough productive of thick yellow sputum, headache, right ear pain, pain in the upper back with coughing and movement, left chest pain with coughing, nausea, nasal congestion, yellowish rhinorrhea, and postnasal drainage. Denies any fever, chills, vomiting, abdominal pain, wheezing, or shortness of breath. She denies any sick contacts. She has not tried any medications at home for symptomatic relief.  Review of Systems:  Other than noted above, the patient denies any of the following symptoms: Systemic:  No fevers, chills, sweats, weight loss or gain, fatigue, or tiredness. Eye:  No redness or discharge. ENT:  No ear pain, drainage, headache, nasal congestion, drainage, sinus pressure, difficulty swallowing, or sore throat. Neck:  No neck pain or swollen glands. Lungs:  No cough, sputum production, hemoptysis, wheezing, chest tightness, shortness of breath or chest pain. GI:  No abdominal pain, nausea, vomiting or diarrhea.  PMFSH:  Past medical history, family history, social history, meds, and allergies were reviewed. She has diabetes, hypertension, and hyperlipidemia. She takes Cymbalta, Victoza, Flexeril, Benicar, Crestor, Actos, and metformin.  Physical Exam:   Vital signs:  BP 134/73  Pulse 80  Temp(Src) 98.2 F (36.8 C) (Oral)  Resp 16  SpO2 100% General:  Alert and oriented.  In no distress.  Skin warm and dry. Eye:  No conjunctival injection or drainage. Lids were normal. ENT:  TMs and canals were normal, without erythema or inflammation.  Nasal mucosa was clear and uncongested, without drainage.  Mucous membranes were moist.  Pharynx was clear with no exudate or drainage.  There were no oral ulcerations or lesions. Neck:   Supple, no adenopathy, tenderness or mass. Lungs:  No respiratory distress.  Lungs were clear to auscultation, without wheezes, rales or rhonchi.  Breath sounds were clear and equal bilaterally.  Heart:  Regular rhythm, without gallops, murmers or rubs. Skin:  Clear, warm, and dry, without rash or lesions.  Radiology:  Dg Chest 2 View  08/05/2012   *RADIOLOGY REPORT*  Clinical Data:  Productive cough and left-sided chest pain.  CHEST - 2 VIEW  Comparison: 03/16/2012  Findings: The heart size and mediastinal contours are within normal limits.  Both lungs are clear.  The visualized skeletal structures are unremarkable.  IMPRESSION: No active disease.   Original Report Authenticated By: Irish Lack, M.D.    Course in Urgent Care Center:   Given ibuprofen 800 mg for pain.  Assessment:  The encounter diagnosis was Acute bronchitis.  Since she is a cigarette smoker, may have some degree of COPD and this will therefore be treated as an acute exacerbation of COPD.  Plan:   1.  The following meds were prescribed:   Discharge Medication List as of 08/05/2012  8:46 PM    START taking these medications   Details  albuterol (PROVENTIL HFA;VENTOLIN HFA) 108 (90 BASE) MCG/ACT inhaler Inhale 1-2 puffs into the lungs every 6 (six) hours as needed for wheezing., Starting 08/05/2012, Until Discontinued, Normal    amoxicillin-clavulanate (AUGMENTIN) 875-125 MG per tablet Take 1 tablet by mouth 2 (two) times daily., Starting 08/05/2012, Until Discontinued, Normal    beclomethasone (QVAR) 80 MCG/ACT inhaler Inhale 2 puffs into the lungs 2 (two) times daily., Starting 08/05/2012, Until  Discontinued, Normal    benzonatate (TESSALON) 200 MG capsule Take 1 capsule (200 mg total) by mouth 3 (three) times daily as needed for cough., Starting 08/05/2012, Until Discontinued, Normal    HYDROcodone-acetaminophen (NORCO/VICODIN) 5-325 MG per tablet 1 to 2 tabs every 4 to 6 hours as needed for pain., Print       2.   The patient was instructed in symptomatic care and handouts were given. 3.  The patient was told to return if becoming worse in any way, if no better in 3 or 4 days, and given some red flag symptoms such as fever, increasing pain, or difficulty breathing that would indicate earlier return. 4.  Follow up here as needed.      Reuben Likes, MD 08/05/12 2103

## 2012-08-05 NOTE — ED Notes (Signed)
Pt c/o chest and back pain since yesterday. Denies sob and pain in left arm. Pt denies fever but states that she is having night sweats. Mild nausea.  Denies vomiting and diarrhea. Pt has tried otc meds with mild relief in symptoms.

## 2012-08-05 NOTE — ED Notes (Signed)
Nonproductive persistent cough.

## 2012-08-10 ENCOUNTER — Encounter (HOSPITAL_COMMUNITY): Payer: Self-pay | Admitting: Emergency Medicine

## 2012-08-10 ENCOUNTER — Emergency Department (INDEPENDENT_AMBULATORY_CARE_PROVIDER_SITE_OTHER)
Admission: EM | Admit: 2012-08-10 | Discharge: 2012-08-10 | Disposition: A | Discharge status: Home / Self Care | Payer: 59 | Source: Home / Self Care

## 2012-08-10 DIAGNOSIS — Z789 Other specified health status: Secondary | ICD-10-CM

## 2012-08-10 DIAGNOSIS — B373 Candidiasis of vulva and vagina: Secondary | ICD-10-CM

## 2012-08-10 DIAGNOSIS — J40 Bronchitis, not specified as acute or chronic: Secondary | ICD-10-CM

## 2012-08-10 DIAGNOSIS — R062 Wheezing: Secondary | ICD-10-CM

## 2012-08-10 DIAGNOSIS — F17200 Nicotine dependence, unspecified, uncomplicated: Secondary | ICD-10-CM

## 2012-08-10 DIAGNOSIS — Z720 Tobacco use: Secondary | ICD-10-CM

## 2012-08-10 MED ORDER — AZITHROMYCIN 250 MG PO TABS: 250.0000 mg | ORAL_TABLET | Freq: Every day | ORAL | Status: DC

## 2012-08-10 MED ORDER — FLUCONAZOLE 150 MG PO TABS: ORAL_TABLET | ORAL | Status: DC

## 2012-08-10 NOTE — ED Notes (Signed)
Pt is c/o of a nonproductive persistent cough since 6/12. Pt was prescribed amoxicillin antibiotic. States that it is causing severe stomach pain and also caused a yeast infection.  Pt denies any other symptoms.

## 2012-08-10 NOTE — ED Provider Notes (Signed)
History     CSN: [id number]  Arrival date & time 08/10/12  1753   None     Chief Complaint  Patient presents with  . Cough    pt seen on 6/12 for same problem. allergic reaction to antibiotics/and yeast infection.     (Consider location/radiation/quality/duration/timing/severity/associated sxs/prior treatment) HPI Comments: As above, the patient states she is feeling better however unable to complete her antibiotic. States she is breathing better, using her albuterol inhaler and having fewer wheezes. Since taking 2 days of Augmentin she has had abdominal pain and then developed symptoms of the yeast infection. She called to ask about the symptoms and was advised to return.   Past Medical History  Diagnosis Date  . Hypertension   . Diabetes mellitus   . High cholesterol   . Fibromyalgia     Past Surgical History  Procedure Laterality Date  . Hysteroscopy  11/07    D&C  . Tubal ligation  1991  . Vaginal hysterectomy  01/2008    [facility] BSO    Family History  Problem Relation Age of Onset  . Hypertension Mother   . Heart disease Mother   . Diabetes Father   . Heart disease Maternal Grandmother   . Heart disease Maternal Grandfather   . Cancer Maternal Grandfather     LUNG     History  Substance Use Topics  . Smoking status: Current Every Day Smoker -- 0.30 packs/day    Types: Cigarettes  . Smokeless tobacco: Never Used  . Alcohol Use: Yes     Comment: rare    OB History   Grav Para Term Preterm Abortions TAB SAB Ect Mult Living   6 4 4  2  2   4       Review of Systems  Constitutional: Negative.   HENT: Negative.   Respiratory:       Rare cough, occasional mild shortness of breath. The symptoms today.  Cardiovascular: Negative.   Genitourinary: Negative.   Neurological: Negative.     Allergies  Review of patient's allergies indicates no known allergies.  Home Medications   Current Outpatient Rx  Name  Route  Sig  Dispense  Refill  . aspirin 81  MG chewable tablet   Oral   Chew 81 mg by mouth daily.         . benzonatate (TESSALON) 200 MG capsule   Oral   Take 1 capsule (200 mg total) by mouth 3 (three) times daily as needed for cough.   30 capsule   0   . DULoxetine (CYMBALTA) 60 MG capsule   Oral   Take 60 mg by mouth daily.           . metFORMIN (GLUCOPHAGE) 500 MG tablet   Oral   Take 500 mg by mouth daily.          Marland Kitchen olmesartan (BENICAR) 5 MG tablet   Oral   Take 5 mg by mouth daily.           . pioglitazone-metformin (ACTOPLUS MET) 15-500 MG per tablet   Oral   Take 1 tablet by mouth daily.         . rosuvastatin (CRESTOR) 10 MG tablet   Oral   Take 10 mg by mouth daily.           Marland Kitchen zolpidem (AMBIEN) 10 MG tablet   Oral   Take 10 mg by mouth at bedtime as needed. Takes 1/2 tab for sleep as  needed         . albuterol (PROVENTIL HFA;VENTOLIN HFA) 108 (90 BASE) MCG/ACT inhaler   Inhalation   Inhale 1-2 puffs into the lungs every 6 (six) hours as needed for wheezing.   1 Inhaler   0   . azithromycin (ZITHROMAX) 250 MG tablet   Oral   Take 1 tablet (250 mg total) by mouth daily. 2 tabs po on day 1, 1 tab po on days 2-5   6 tablet   0   . beclomethasone (QVAR) 80 MCG/ACT inhaler   Inhalation   Inhale 2 puffs into the lungs 2 (two) times daily.   1 Inhaler   0   . Calcium Carbonate-Vitamin D (CALCIUM + D PO)   Oral   Take by mouth.           . cyclobenzaprine (FLEXERIL) 10 MG tablet   Oral   Take 10 mg by mouth 3 (three) times daily as needed. Takes 1/2 of tablet as needed for muscular discomfort.         . fluconazole (DIFLUCAN) 150 MG tablet   Oral   Take 1 tablet (150 mg total) by mouth once. As needed for yeast   4 tablet   0   . fluconazole (DIFLUCAN) 150 MG tablet      1 tab po x 1. May repeat in 72 hours if no improvement   2 tablet   0   . HYDROcodone-acetaminophen (NORCO/VICODIN) 5-325 MG per tablet      1 to 2 tabs every 4 to 6 hours as needed for pain.    20 tablet   0   . Liraglutide (VICTOZA [place])   Subcutaneous   Inject into the skin.           . meloxicam (MOBIC) 15 MG tablet   Oral   Take 0.5 tablets (7.5 mg total) by mouth 2 (two) times daily.   60 tablet   0     BP 129/86  Pulse 79  Temp(Src) 97.9 F (36.6 C) (Oral)  Resp 16  SpO2 98%  Physical Exam  Nursing note and vitals reviewed. Constitutional: She is oriented to person, place, and time. She appears well-developed and well-nourished. No distress.  HENT:  Mouth/Throat: Oropharynx is clear and moist.  Eyes: EOM are normal.  Neck: Normal range of motion. Neck supple.  Cardiovascular: Normal rate and regular rhythm.   Pulmonary/Chest: Effort normal. She has wheezes.  Distant, diffuse expiratory wheezes. Good air movement.  Musculoskeletal: She exhibits no edema.  Lymphadenopathy:    She has no cervical adenopathy.  Neurological: She is alert and oriented to person, place, and time. She exhibits normal muscle tone.  Skin: Skin is warm and dry.  Psychiatric: She has a normal mood and affect.    ED Course  Procedures (including critical care time)  Labs Reviewed - No data to display No results found.   1. Antibiotic drug intolerance   2. Bronchitis   3. Wheezing   4. Candida vaginitis   5. Tobacco abuse disorder       MDM  Patient stop her antibiotics 3 days ago. We will start Z-Pak. Diflucan one tablet now may repeat in 72 hours if symptoms persist. Drink plenty of fluids. Continue to use her inhaler as needed. Followup with your primary care doctor next week if symptoms persist or get worse. For any worsening of breathing , Cough, fever or shortness of breath and return or go to the emergent  dept. In general feels much better, looks better. D/C in stable and improved condition since 4 days ago.        Hayden Rasmussen, NP 08/10/12 1917  Hayden Rasmussen, NP 08/10/12 1919

## 2012-08-10 NOTE — ED Provider Notes (Signed)
Medical screening examination/treatment/procedure(s) were performed by resident physician or non-physician practitioner and as supervising physician I was immediately available for consultation/collaboration.   Barkley Bruns MD.   Linna Hoff, MD 08/10/12 2005

## 2012-10-28 ENCOUNTER — Other Ambulatory Visit: Payer: Self-pay

## 2012-10-28 DIAGNOSIS — Z1231 Encounter for screening mammogram for malignant neoplasm of breast: Secondary | ICD-10-CM

## 2012-11-29 ENCOUNTER — Ambulatory Visit: Payer: 59

## 2013-01-06 ENCOUNTER — Other Ambulatory Visit: Payer: Self-pay | Admitting: Gastroenterology

## 2013-01-06 DIAGNOSIS — R110 Nausea: Secondary | ICD-10-CM

## 2013-01-17 ENCOUNTER — Ambulatory Visit: Payer: 59

## 2013-01-28 ENCOUNTER — Emergency Department (INDEPENDENT_AMBULATORY_CARE_PROVIDER_SITE_OTHER)
Admission: EM | Admit: 2013-01-28 | Discharge: 2013-01-28 | Disposition: A | Discharge status: Home / Self Care | Payer: 59 | Source: Home / Self Care

## 2013-01-28 ENCOUNTER — Encounter (HOSPITAL_COMMUNITY): Payer: Self-pay | Admitting: Emergency Medicine

## 2013-01-28 ENCOUNTER — Ambulatory Visit (HOSPITAL_COMMUNITY)
Admission: RE | Admit: 2013-01-28 | Discharge: 2013-01-28 | Disposition: A | Discharge status: Home / Self Care | Payer: 59 | Source: Ambulatory Visit | Attending: Gastroenterology | Admitting: Gastroenterology

## 2013-01-28 DIAGNOSIS — R05 Cough: Secondary | ICD-10-CM

## 2013-01-28 DIAGNOSIS — F17200 Nicotine dependence, unspecified, uncomplicated: Secondary | ICD-10-CM

## 2013-01-28 DIAGNOSIS — R110 Nausea: Secondary | ICD-10-CM

## 2013-01-28 DIAGNOSIS — R0982 Postnasal drip: Secondary | ICD-10-CM

## 2013-01-28 DIAGNOSIS — J398 Other specified diseases of upper respiratory tract: Secondary | ICD-10-CM

## 2013-01-28 DIAGNOSIS — J9809 Other diseases of bronchus, not elsewhere classified: Secondary | ICD-10-CM

## 2013-01-28 NOTE — ED Notes (Signed)
C/o productive cough on/off with thick creamy sputum.  Chest tightness.  Denies fever and sob.  No otc meds taken for symptoms.  On set two weeks ago.

## 2013-01-28 NOTE — ED Provider Notes (Signed)
Medical screening examination/treatment/procedure(s) were performed by resident physician or non-physician practitioner and as supervising physician I was immediately available for consultation/collaboration.   Kassy Mcenroe DOUGLAS MD.   Kalyna Paolella D Umi Mainor, MD 01/28/13 1707 

## 2013-01-28 NOTE — ED Provider Notes (Signed)
CSN: [id number]     Arrival date & time 01/28/13  0906 History   First MD Initiated Contact with Patient 01/28/13 [phone]     Chief Complaint  Patient presents with  . Cough   (Consider location/radiation/quality/duration/timing/severity/associated sxs/prior Treatment) HPI Comments: 52 y o F with cough for a week. In AM awakens with PND in throat causing a cough.  She is a daily smoker  No fever or dyspnea. She has not used her Albuterol in over a week    Past Medical History  Diagnosis Date  . Hypertension   . Diabetes mellitus   . High cholesterol   . Fibromyalgia    Past Surgical History  Procedure Laterality Date  . Hysteroscopy  11/07    D&C  . Tubal ligation  1991  . Vaginal hysterectomy  01/2008    [facility] BSO   Family History  Problem Relation Age of Onset  . Hypertension Mother   . Heart disease Mother   . Diabetes Father   . Heart disease Maternal Grandmother   . Heart disease Maternal Grandfather   . Cancer Maternal Grandfather     LUNG    History  Substance Use Topics  . Smoking status: Current Every Day Smoker -- 0.30 packs/day    Types: Cigarettes  . Smokeless tobacco: Never Used  . Alcohol Use: Yes     Comment: rare   OB History   Grav Para Term Preterm Abortions TAB SAB Ect Mult Living   6 4 4  2  2   4      Review of Systems  Constitutional: Negative for fever, chills, activity change, appetite change and fatigue.  HENT: Positive for congestion, postnasal drip and voice change. Negative for facial swelling, sinus pressure, sore throat and trouble swallowing.   Eyes: Negative.   Respiratory: Positive for cough and chest tightness. Negative for shortness of breath.   Cardiovascular: Negative.   Gastrointestinal: Negative.   Musculoskeletal: Negative for neck pain and neck stiffness.  Skin: Negative for pallor and rash.  Neurological: Negative.     Allergies  Review of patient's allergies indicates no known allergies.  Home Medications    Current Outpatient Rx  Name  Route  Sig  Dispense  Refill  . aspirin 81 MG chewable tablet   Oral   Chew 81 mg by mouth daily.         . cyclobenzaprine (FLEXERIL) 10 MG tablet   Oral   Take 10 mg by mouth 3 (three) times daily as needed. Takes 1/2 of tablet as needed for muscular discomfort.         . DULoxetine (CYMBALTA) 60 MG capsule   Oral   Take 60 mg by mouth daily.           . Liraglutide (VICTOZA [place])   Subcutaneous   Inject into the skin.           . metFORMIN (GLUCOPHAGE) 500 MG tablet   Oral   Take 500 mg by mouth daily.          Marland Kitchen olmesartan (BENICAR) 5 MG tablet   Oral   Take 5 mg by mouth daily.           . pioglitazone-metformin (ACTOPLUS MET) 15-500 MG per tablet   Oral   Take 1 tablet by mouth daily.         . rosuvastatin (CRESTOR) 10 MG tablet   Oral   Take 10 mg by mouth daily.           Marland Kitchen  albuterol (PROVENTIL HFA;VENTOLIN HFA) 108 (90 BASE) MCG/ACT inhaler   Inhalation   Inhale 1-2 puffs into the lungs every 6 (six) hours as needed for wheezing.   1 Inhaler   0   . azithromycin (ZITHROMAX) 250 MG tablet   Oral   Take 1 tablet (250 mg total) by mouth daily. 2 tabs po on day 1, 1 tab po on days 2-5   6 tablet   0   . beclomethasone (QVAR) 80 MCG/ACT inhaler   Inhalation   Inhale 2 puffs into the lungs 2 (two) times daily.   1 Inhaler   0   . benzonatate (TESSALON) 200 MG capsule   Oral   Take 1 capsule (200 mg total) by mouth 3 (three) times daily as needed for cough.   30 capsule   0   . Calcium Carbonate-Vitamin D (CALCIUM + D PO)   Oral   Take by mouth.           . fluconazole (DIFLUCAN) 150 MG tablet   Oral   Take 1 tablet (150 mg total) by mouth once. As needed for yeast   4 tablet   0   . fluconazole (DIFLUCAN) 150 MG tablet      1 tab po x 1. May repeat in 72 hours if no improvement   2 tablet   0   . HYDROcodone-acetaminophen (NORCO/VICODIN) 5-325 MG per tablet      1 to 2 tabs every 4 to 6  hours as needed for pain.   20 tablet   0   . zolpidem (AMBIEN) 10 MG tablet   Oral   Take 10 mg by mouth at bedtime as needed. Takes 1/2 tab for sleep as needed          BP 117/80  Pulse 82  Temp(Src) 97.7 F (36.5 C) (Oral)  Resp 16  SpO2 100% Physical Exam  Nursing note and vitals reviewed. Constitutional: She is oriented to person, place, and time. She appears well-developed and well-nourished. No distress.  HENT:  Mouth/Throat: No oropharyngeal exudate.  Bilateral TM's nl OP minor erythema and clear PND  Eyes: Conjunctivae and EOM are normal.  Neck: Normal range of motion. Neck supple.  Cardiovascular: Normal rate, regular rhythm and normal heart sounds.   Pulmonary/Chest: Effort normal. No respiratory distress. She has wheezes.  Taking a deep breath elicits coughing + for expiratory wheeze and slight prolonged exp phase Good air movement at rest.  Musculoskeletal: Normal range of motion. She exhibits no edema.  Lymphadenopathy:    She has no cervical adenopathy.  Neurological: She is alert and oriented to person, place, and time.  Skin: Skin is warm and dry. No rash noted.  Psychiatric: She has a normal mood and affect.    ED Course  Procedures (including critical care time) Labs Review Labs Reviewed - No data to display Imaging Review US Abdomen Complete  01/28/2013   CLINICAL DATA:  Nausea  EXAM: ULTRASOUND ABDOMEN COMPLETE  COMPARISON:  None.  FINDINGS: Gallbladder:  The gallbladder is distended with no evidence of stones, wall thickening, or pericholecystic fluid. There is no positive sonographic Murphy's sign.  Common bile duct:  Diameter: 4.5 mm.  Liver:  The echotexture of the liver is appears mildly increased. This likely reflects fatty infiltration. Areas of focal fatty sparing are suspected  IVC:  No abnormality visualized.  Pancreas:  Bowel gas obscured much of the pancreas.  Spleen:  Next  Right Kidney:  Length: 10.1 cm.  Echogenicity within normal  limits. No mass or hydronephrosis visualized.  Left Kidney:  Length: 10.4 cm. Echogenicity within normal limits. No mass or hydronephrosis visualized.  Abdominal aorta:  The abdominal aorta exhibits a normal tapering caliber with maximal measured diameter proximally of just under 2.5 cm.  Other findings:  No ascites is demonstrated.  IMPRESSION: 1. The gallbladder is normal in appearance with no evidence of stones or acute cholecystitis. 2. The liver exhibits fatty infiltrated change but no focal mass or ductal dilation. 3. The kidneys, spleen, and abdominal aorta appear normal. 4. Bowel gas obscured the pancreas.   Electronically Signed   By: Shanea Karney  [place]   On: 01/28/2013 09:52      MDM   1. Cough   2. Recurrent bronchospasm   3. PND (post-nasal drip)   4. Tobacco use disorder      Start using your albuterol again Allegra for drainage Increase fluids Stop smoking.  Hayden Rasmussen, NP 01/28/13 1006

## 2013-02-14 ENCOUNTER — Encounter (HOSPITAL_COMMUNITY): Payer: 59

## 2013-02-16 ENCOUNTER — Ambulatory Visit (HOSPITAL_COMMUNITY): Payer: 59

## 2013-02-16 ENCOUNTER — Ambulatory Visit (HOSPITAL_COMMUNITY)
Admission: RE | Admit: 2013-02-16 | Discharge: 2013-02-16 | Disposition: A | Discharge status: Home / Self Care | Payer: 59 | Source: Ambulatory Visit | Attending: Gastroenterology | Admitting: Gastroenterology

## 2013-02-16 DIAGNOSIS — R110 Nausea: Secondary | ICD-10-CM | POA: Insufficient documentation

## 2013-02-16 MED ORDER — TECHNETIUM TC 99M MEBROFENIN IV KIT
5.0000 | PACK | Freq: Once | INTRAVENOUS | Status: AC | PRN
Start: 2013-02-16 — End: 2013-02-16
  Administered 2013-02-16: 5 via INTRAVENOUS

## 2013-03-02 ENCOUNTER — Ambulatory Visit
Admission: RE | Admit: 2013-03-02 | Discharge: 2013-03-02 | Disposition: A | Discharge status: Home / Self Care | Payer: 59 | Source: Ambulatory Visit

## 2013-03-02 DIAGNOSIS — Z1231 Encounter for screening mammogram for malignant neoplasm of breast: Secondary | ICD-10-CM

## 2013-03-07 ENCOUNTER — Encounter: Payer: Self-pay | Admitting: Gynecology

## 2013-03-07 ENCOUNTER — Other Ambulatory Visit (HOSPITAL_COMMUNITY)
Admission: RE | Admit: 2013-03-07 | Discharge: 2013-03-07 | Disposition: A | Discharge status: Home / Self Care | Payer: 59 | Source: Ambulatory Visit | Attending: Gynecology | Admitting: Gynecology

## 2013-03-07 ENCOUNTER — Ambulatory Visit (INDEPENDENT_AMBULATORY_CARE_PROVIDER_SITE_OTHER): Payer: 59 | Admitting: Gynecology

## 2013-03-07 VITALS — BP 116/76 | Ht 60.0 in | Wt 147.0 lb

## 2013-03-07 DIAGNOSIS — N952 Postmenopausal atrophic vaginitis: Secondary | ICD-10-CM

## 2013-03-07 DIAGNOSIS — Z01419 Encounter for gynecological examination (general) (routine) without abnormal findings: Secondary | ICD-10-CM

## 2013-03-07 NOTE — Patient Instructions (Signed)
Followup in one year, sooner as needed.  Consider Stop Smoking.  Help is available at [facility] cessation program @ [email or web address] or 858 [phone]. OR 1-800-QUIT-NOW (727)102-9790(1-734-305-3815) for free smoking cessation counseling.  Smokefree.gov (http://www.[place].com/http://www.smokefree.gov) provides free, accurate, evidence-based information and professional assistance to help support the immediate and long-term needs of people trying to quit smoking.    Smoking Hazards Smoking cigarettes is extremely bad for your health. Tobacco smoke has over 200 known poisons in it. There are over 60 chemicals in tobacco smoke that cause cancer. Some of the chemicals found in cigarette smoke include:  Cyanide.  Benzene.  Formaldehyde.  Methanol (wood alcohol).  Acetylene (fuel used in welding torches).  Ammonia.  Cigarette smoke also contains the poisonous gases nitrogen oxide and carbon monoxide.  Cigarette smokers have an increased risk of many serious medical problems, including: Lung cancer.  Lung disease (such as pneumonia, bronchitis, and emphysema).  Heart attack and chest pain due to the heart not getting enough oxygen (angina).  Heart disease and peripheral blood vessel disease.  Hypertension.  Stroke.  Oral cancer (cancer of the lip, mouth, or voice box).  Bladder cancer.  Pancreatic cancer.  Cervical cancer.  Pregnancy complications, including premature birth.  Low birthweight babies.  Early menopause.  Lower estrogen level for women.  Infertility.  Facial wrinkles.  Blindness.  Increased risk of broken bones (fractures).  Senile dementia.  Stillbirths and smaller newborn babies, birth defects, and genetic damage to sperm.  Stomach ulcers and internal bleeding.  Children of smokers have an increased risk of the following, because of secondhand smoke exposure:  Sudden infant death syndrome (SIDS).  Respiratory infections.  Lung cancer.  Heart disease.  Ear infections.  Smoking  causes approximately: 90% of all lung cancer deaths in men.  80% of all lung cancer deaths in women.  90% of deaths from chronic obstructive lung disease.  Compared with nonsmokers, smoking increases the risk of: Coronary heart disease by 2 to 4 times.  Stroke by 2 to 4 times.  Men developing lung cancer by 23 times.  Women developing lung cancer by 13 times.  Dying from chronic obstructive lung diseases by 12 times.  Someone who smokes 2 packs a day loses about 8 years of his or her life. Even smoking lightly shortens your life expectancy by several years. You can greatly reduce the risk of medical problems for you and your family by stopping now. Smoking is the most preventable cause of death and disease in our society. Within days of quitting smoking, your circulation returns to normal, you decrease the risk of having a heart attack, and your lung capacity improves. There may be some increased phlegm in the first few days after quitting, and it may take months for your lungs to clear up completely. Quitting for 10 years cuts your lung cancer risk to almost that of a nonsmoker. WHY IS SMOKING ADDICTIVE? Nicotine is the chemical agent in tobacco that is capable of causing addiction or dependence.  When you smoke and inhale, nicotine is absorbed rapidly into the bloodstream through your lungs. Nicotine absorbed through the lungs is capable of creating a powerful addiction. Both inhaled and non-inhaled nicotine may be addictive.  Addiction studies of cigarettes and spit tobacco show that addiction to nicotine occurs mainly during the teen years, when young people begin using tobacco products.  WHAT ARE THE BENEFITS OF QUITTING?  There are many health benefits to quitting smoking.  Likelihood of developing cancer and heart disease  decreases. Health improvements are seen almost immediately.  Blood pressure, pulse rate, and breathing patterns start returning to normal soon after quitting.  People who  quit may see an improvement in their overall quality of life.  Some people choose to quit all at once. Other options include nicotine replacement products, such as patches, gum, and nasal sprays. Do not use these products without first checking with your caregiver. QUITTING SMOKING It is not easy to quit smoking. Nicotine is addicting, and longtime habits are hard to change. To start, you can write down all your reasons for quitting, tell your family and friends you want to quit, and ask for their help. Throw your cigarettes away, chew gum or cinnamon sticks, keep your hands busy, and drink extra water or juice. Go for walks and practice deep breathing to relax. Think of all the money you are saving: around $1,000 a year, for the average pack-a-day smoker. Nicotine patches and gum have been shown to improve success at efforts to stop smoking. Zyban (bupropion) is an anti-depressant drug that can be prescribed to reduce nicotine withdrawal symptoms and to suppress the urge to smoke. Smoking is an addiction with both physical and psychological effects. Joining a stop-smoking support group can help you cope with the emotional issues. For more information and advice on programs to stop smoking, call your doctor, your local hospital, or these organizations: American Lung Association - 1-800-LUNGUSA   Smoking Cessation  This document explains the best ways for you to quit smoking and new treatments to help. It lists new medicines that can double or triple your chances of quitting and quitting for good. It also considers ways to avoid relapses and concerns you may have about quitting, including weight gain. NICOTINE: A POWERFUL ADDICTION If you have tried to quit smoking, you know how hard it can be. It is hard because nicotine is a very addictive drug. For some people, it can be as addictive as heroin or cocaine. Usually, people make 2 or 3 tries, or more, before finally being able to quit. Each time you try to  quit, you can learn about what helps and what hurts. Quitting takes hard work and a lot of effort, but you can quit smoking. QUITTING SMOKING IS ONE OF THE MOST IMPORTANT THINGS YOU WILL EVER DO.  You will live longer, feel better, and live better.   The impact on your body of quitting smoking is felt almost immediately:   Within 20 minutes, blood pressure decreases. Pulse returns to its normal level.   After 8 hours, carbon monoxide levels in the blood return to normal. Oxygen level increases.   After 24 hours, chance of heart attack starts to decrease. Breath, hair, and body stop smelling like smoke.   After 48 hours, damaged nerve endings begin to recover. Sense of taste and smell improve.   After 72 hours, the body is virtually free of nicotine. Bronchial tubes relax and breathing becomes easier.   After 2 to 12 weeks, lungs can hold more air. Exercise becomes easier and circulation improves.   Quitting will reduce your risk of having a heart attack, stroke, cancer, or lung disease:   After 1 year, the risk of coronary heart disease is cut in half.   After 5 years, the risk of stroke falls to the same as a nonsmoker.   After 10 years, the risk of lung cancer is cut in half and the risk of other cancers decreases significantly.   After 15 years,  the risk of coronary heart disease drops, usually to the level of a nonsmoker.   If you are pregnant, quitting smoking will improve your chances of having a healthy baby.   The people you live with, especially your children, will be healthier.   You will have extra money to spend on things other than cigarettes.  FIVE KEYS TO QUITTING Studies have shown that these 5 steps will help you quit smoking and quit for good. You have the best chances of quitting if you use them together: 1. Get ready.  2. Get support and encouragement.  3. Learn new skills and behaviors.  4. Get medicine to reduce your nicotine addiction and use it  correctly.  5. Be prepared for relapse or difficult situations. Be determined to continue trying to quit, even if you do not succeed at first.  1. GET READY  Set a quit date.   Change your environment.   Get rid of ALL cigarettes, ashtrays, matches, and lighters in your home, car, and place of work.   Do not let people smoke in your home.   Review your past attempts to quit. Think about what worked and what did not.   Once you quit, do not smoke. NOT EVEN A PUFF!  2. GET SUPPORT AND ENCOURAGEMENT Studies have shown that you have a better chance of being successful if you have help. You can get support in many ways.  Tell your family, friends, and coworkers that you are going to quit and need their support. Ask them not to smoke around you.   Talk to your caregivers (doctor, dentist, nurse, pharmacist, psychologist, and/or smoking counselor).   Get individual, group, or telephone counseling and support. The more counseling you have, the better your chances are of quitting. Programs are available at local hospitals and health centers. Call your local health department for information about programs in your area.   Spiritual beliefs and practices may help some smokers quit.   Quit meters are small computer programs online or downloadable that keep track of quit statistics, such as amount of "quit-time," cigarettes not smoked, and money saved.   Many smokers find one or more of the many self-help books available useful in helping them quit and stay off tobacco.  3. LEARN NEW SKILLS AND BEHAVIORS  Try to distract yourself from urges to smoke. Talk to someone, go for a walk, or occupy your time with a task.   When you first try to quit, change your routine. Take a different route to work. Drink tea instead of coffee. Eat breakfast in a different place.   Do something to reduce your stress. Take a hot bath, exercise, or read a book.   Plan something enjoyable to do every day. Reward  yourself for not smoking.   Explore interactive web-based programs that specialize in helping you quit.  4. GET MEDICINE AND USE IT CORRECTLY Medicines can help you stop smoking and decrease the urge to smoke. Combining medicine with the above behavioral methods and support can quadruple your chances of successfully quitting smoking. The [place] Food and Drug Administration (FDA) has approved 7 medicines to help you quit smoking. These medicines fall into 3 categories.  Nicotine replacement therapy (delivers nicotine to your body without the negative effects and risks of smoking):   Nicotine gum: Available over-the-counter.   Nicotine lozenges: Available over-the-counter.   Nicotine inhaler: Available by prescription.   Nicotine nasal spray: Available by prescription.   Nicotine skin patches (transdermal): Available   by prescription and over-the-counter.   Antidepressant medicine (helps people abstain from smoking, but how this works is unknown):   Bupropion sustained-release (SR) tablets: Available by prescription.   Nicotinic receptor partial agonist (simulates the effect of nicotine in your brain):   Varenicline tartrate tablets: Available by prescription.   Ask your caregiver for advice about which medicines to use and how to use them. Carefully read the information on the package.   Everyone who is trying to quit may benefit from using a medicine. If you are pregnant or trying to become pregnant, nursing an infant, you are under age 18, or you smoke fewer than 10 cigarettes per day, talk to your caregiver before taking any nicotine replacement medicines.   You should stop using a nicotine replacement product and call your caregiver if you experience nausea, dizziness, weakness, vomiting, fast or irregular heartbeat, mouth problems with the lozenge or gum, or redness or swelling of the skin around the patch that does not go away.   Do not use any other product containing nicotine  while using a nicotine replacement product.   Talk to your caregiver before using these products if you have diabetes, heart disease, asthma, stomach ulcers, you had a recent heart attack, you have high blood pressure that is not controlled with medicine, a history of irregular heartbeat, or you have been prescribed medicine to help you quit smoking.  5. BE PREPARED FOR RELAPSE OR DIFFICULT SITUATIONS  Most relapses occur within the first 3 months after quitting. Do not be discouraged if you start smoking again. Remember, most people try several times before they finally quit.   You may have symptoms of withdrawal because your body is used to nicotine. You may crave cigarettes, be irritable, feel very hungry, cough often, get headaches, or have difficulty concentrating.   The withdrawal symptoms are only temporary. They are strongest when you first quit, but they will go away within 10 to 14 days.  Here are some difficult situations to watch for:  Alcohol. Avoid drinking alcohol. Drinking lowers your chances of successfully quitting.   Caffeine. Try to reduce the amount of caffeine you consume. It also lowers your chances of successfully quitting.   Other smokers. Being around smoking can make you want to smoke. Avoid smokers.   Weight gain. Many smokers will gain weight when they quit, usually less than 10 pounds. Eat a healthy diet and stay active. Do not let weight gain distract you from your main goal, quitting smoking. Some medicines that help you quit smoking may also help delay weight gain. You can always lose the weight gained after you quit.   Bad mood or depression. There are a lot of ways to improve your mood other than smoking.  If you are having problems with any of these situations, talk to your caregiver. SPECIAL SITUATIONS AND CONDITIONS Studies suggest that everyone can quit smoking. Your situation or condition can give you a special reason to quit.  Pregnant women/new  mothers: By quitting, you protect your baby's health and your own.   Hospitalized patients: By quitting, you reduce health problems and help healing.   Heart attack patients: By quitting, you reduce your risk of a second heart attack.   Lung, head, and neck cancer patients: By quitting, you reduce your chance of a second cancer.   Parents of children and adolescents: By quitting, you protect your children from illnesses caused by secondhand smoke.  QUESTIONS TO THINK ABOUT Think about the following questions   before you try to stop smoking. You may want to talk about your answers with your caregiver.  Why do you want to quit?   If you tried to quit in the past, what helped and what did not?   What will be the most difficult situations for you after you quit? How will you plan to handle them?   Who can help you through the tough times? Your family? Friends? Caregiver?   What pleasures do you get from smoking? What ways can you still get pleasure if you quit?  Here are some questions to ask your caregiver:  How can you help me to be successful at quitting?   What medicine do you think would be best for me and how should I take it?   What should I do if I need more help?   What is smoking withdrawal like? How can I get information on withdrawal?  Quitting takes hard work and a lot of effort, but you can quit smoking.   

## 2013-03-07 NOTE — Progress Notes (Signed)
[name] 09/08/1960 [id number]        53 y.o.  [id number] for annual exam.  Several issues noted below.  Past medical history,surgical history, problem list, medications, allergies, family history and social history were all reviewed and documented in the EPIC chart.  ROS:  Performed and pertinent positives and negatives are included in the history, assessment and plan .  Exam: Kim assistant Filed Vitals:   03/07/13 1602  BP: 116/76  Height: 5' (1.524 m)  Weight: 147 lb (66.679 kg)   General appearance  Normal Skin grossly normal Head/Neck normal with no cervical or supraclavicular adenopathy thyroid normal Lungs  clear Cardiac RR, without RMG Abdominal  soft, nontender, without masses, organomegaly or hernia Breasts  examined lying and sitting without masses, retractions, discharge or axillary adenopathy. Pelvic  Ext/BUS/vagina  Normal with mild atrophic changes. Pap of cuff done.  Adnexa  Without masses or tenderness    Anus and perineum  Normal   Rectovaginal  Normal sphincter tone without palpated masses or tenderness.    Assessment/Plan:  53 y.o. [id number] female for annual exam.   1. Status post LAVH BSO 2009 for benign indications. Without significant hot flushes, night sweats or dyspareunia. Some intermittent vaginal dryness but overall doing well. Does not want interventional treatment. Will continue to monitor. 2. Pap smear 2011. No history of significant abnormal Pap smears. Patient requests to continue screening despite having hysterectomy for benign indications. Pap of cuff done today. We'll plan every 3 year screening at present. 3. Mammography 02/2013. Continue with annual mammography. SBE monthly reviewed. 4. Colonoscopy 01/2013. Repeat at their recommended interval. 5. DEXA 2009 normal. Recommend repeat at age 53. Increase calcium and vitamin D reviewed. 6. Stop smoking again discussed and encouraged. 7. Health maintenance. No blood work done as this is all  done through her primary physician's office. Followup one year, sooner as needed.   Note: This document was prepared with digital dictation and possible smart phrase technology. Any transcriptional errors that result from this process are unintentional.   Dara LordsFONTAINE,Marlissa Emerick P MD, 4:30 PM 03/07/2013

## 2013-09-07 ENCOUNTER — Encounter (HOSPITAL_COMMUNITY): Payer: Self-pay | Admitting: Emergency Medicine

## 2013-09-07 ENCOUNTER — Emergency Department (INDEPENDENT_AMBULATORY_CARE_PROVIDER_SITE_OTHER)
Admission: EM | Admit: 2013-09-07 | Discharge: 2013-09-07 | Disposition: A | Discharge status: Home / Self Care | Payer: 59 | Source: Home / Self Care | Attending: Family Medicine | Admitting: Family Medicine

## 2013-09-07 DIAGNOSIS — J40 Bronchitis, not specified as acute or chronic: Secondary | ICD-10-CM

## 2013-09-07 MED ORDER — IPRATROPIUM-ALBUTEROL 0.5-2.5 (3) MG/3ML IN SOLN
RESPIRATORY_TRACT | Status: AC
  Filled 2013-09-07: qty 3

## 2013-09-07 MED ORDER — GUAIFENESIN-CODEINE 100-10 MG/5ML PO SOLN: 5.0000 mL | Freq: Every evening | ORAL | Status: DC | PRN

## 2013-09-07 MED ORDER — AZITHROMYCIN 250 MG PO TABS: 250.0000 mg | ORAL_TABLET | Freq: Every day | ORAL | Status: DC

## 2013-09-07 MED ORDER — PREDNISONE 10 MG PO TABS: 30.0000 mg | ORAL_TABLET | Freq: Every day | ORAL | Status: DC

## 2013-09-07 MED ORDER — IPRATROPIUM-ALBUTEROL 0.5-2.5 (3) MG/3ML IN SOLN
3.0000 mL | Freq: Once | RESPIRATORY_TRACT | Status: AC
  Administered 2013-09-07: 3 mL via RESPIRATORY_TRACT

## 2013-09-07 NOTE — Discharge Instructions (Signed)
Thank you for coming in today. Continue albuterol as needed.  Starter QVAR Take the azithromycin and prednisone daily for 5 days.  Use the cough medicine as needed.  Call or go to the emergency room if you get worse, have trouble breathing, have chest pains, or palpitations.    Bronchitis Bronchitis is inflammation of the airways that extend from the windpipe into the lungs (bronchi). The inflammation often causes mucus to develop, which leads to a cough. If the inflammation becomes severe, it may cause shortness of breath. CAUSES  Bronchitis may be caused by:   Viral infections.   Bacteria.   Cigarette smoke.   Allergens, pollutants, and other irritants.  SIGNS AND SYMPTOMS  The most common symptom of bronchitis is a frequent cough that produces mucus. Other symptoms include:  Fever.   Body aches.   Chest congestion.   Chills.   Shortness of breath.   Sore throat.  DIAGNOSIS  Bronchitis is usually diagnosed through a medical history and physical exam. Tests, such as chest X-rays, are sometimes done to rule out other conditions.  TREATMENT  You may need to avoid contact with whatever caused the problem (smoking, for example). Medicines are sometimes needed. These may include:  Antibiotics. These may be prescribed if the condition is caused by bacteria.  Cough suppressants. These may be prescribed for relief of cough symptoms.   Inhaled medicines. These may be prescribed to help open your airways and make it easier for you to breathe.   Steroid medicines. These may be prescribed for those with recurrent (chronic) bronchitis. HOME CARE INSTRUCTIONS  Get plenty of rest.   Drink enough fluids to keep your urine clear or pale yellow (unless you have a medical condition that requires fluid restriction). Increasing fluids may help thin your secretions and will prevent dehydration.   Only take over-the-counter or prescription medicines as directed by your  health care provider.  Only take antibiotics as directed. Make sure you finish them even if you start to feel better.  Avoid secondhand smoke, irritating chemicals, and strong fumes. These will make bronchitis worse. If you are a smoker, quit smoking. Consider using nicotine gum or skin patches to help control withdrawal symptoms. Quitting smoking will help your lungs heal faster.   Put a cool-mist humidifier in your bedroom at night to moisten the air. This may help loosen mucus. Change the water in the humidifier daily. You can also run the hot water in your shower and sit in the bathroom with the door closed for 5-10 minutes.   Follow up with your health care provider as directed.   Wash your hands frequently to avoid catching bronchitis again or spreading an infection to others.  SEEK MEDICAL CARE IF: Your symptoms do not improve after 1 week of treatment.  SEEK IMMEDIATE MEDICAL CARE IF:  Your fever increases.  You have chills.   You have chest pain.   You have worsening shortness of breath.   You have bloody sputum.  You faint.  You have lightheadedness.  You have a severe headache.   You vomit repeatedly. MAKE SURE YOU:   Understand these instructions.  Will watch your condition.  Will get help right away if you are not doing well or get worse. Document Released: 02/10/2005 Document Revised: 12/01/2012 Document Reviewed: 10/05/2012 [facility] Patient Information 2015 [place] [place]. This information is not intended to replace advice given to you by your health care provider. Make sure you discuss any questions you have with your  health care provider. ° °

## 2013-09-07 NOTE — ED Provider Notes (Signed)
[name] is a 53 y.o. female who presents to Urgent Care today for cough congestion headache sinus pain and pressure. Symptoms present for 2 weeks. Patient also notes wheezing. Albuterol helps some. No nausea vomiting or diarrhea. Patient feels well otherwise.   Past Medical History  Diagnosis Date  . Hypertension   . Diabetes mellitus   . High cholesterol   . Fibromyalgia    History  Substance Use Topics  . Smoking status: Current Every Day Smoker -- 0.30 packs/day    Types: Cigarettes  . Smokeless tobacco: Never Used  . Alcohol Use: Yes     Comment: rare   ROS as above Medications: No current facility-administered medications for this encounter.   Current Outpatient Prescriptions  Medication Sig Dispense Refill  . aspirin 81 MG chewable tablet Chew 81 mg by mouth daily.      . Calcium Carbonate-Vitamin D (CALCIUM + D PO) Take by mouth.        . cyclobenzaprine (FLEXERIL) 10 MG tablet Take 10 mg by mouth 3 (three) times daily as needed. Takes 1/2 of tablet as needed for muscular discomfort.      . DULoxetine (CYMBALTA) 60 MG capsule Take 60 mg by mouth daily.        . Liraglutide (VICTOZA Export) Inject into the skin.        . metFORMIN (GLUCOPHAGE) 500 MG tablet Take 500 mg by mouth daily.       Marland Kitchen olmesartan (BENICAR) 5 MG tablet Take 5 mg by mouth daily.        . rosuvastatin (CRESTOR) 10 MG tablet Take 10 mg by mouth daily.        . [DISCONTINUED] albuterol (PROVENTIL HFA;VENTOLIN HFA) 108 (90 BASE) MCG/ACT inhaler Inhale 1-2 puffs into the lungs every 6 (six) hours as needed for wheezing.  1 Inhaler  0  . [DISCONTINUED] pioglitazone-metformin (ACTOPLUS MET) 15-500 MG per tablet Take 1 tablet by mouth daily.      Marland Kitchen azithromycin (ZITHROMAX) 250 MG tablet Take 1 tablet (250 mg total) by mouth daily. Take first 2 tablets together, then 1 every day until finished.  6 tablet  0  . beclomethasone (QVAR) 80 MCG/ACT inhaler Inhale 2 puffs into the lungs 2 (two) times daily.  1  Inhaler  0  . guaiFENesin-codeine 100-10 MG/5ML syrup Take 5 mLs by mouth at bedtime as needed for cough.  120 mL  0  . predniSONE (DELTASONE) 10 MG tablet Take 3 tablets (30 mg total) by mouth daily.  15 tablet  0  . [DISCONTINUED] zolpidem (AMBIEN) 10 MG tablet Take 10 mg by mouth at bedtime as needed. Takes 1/2 tab for sleep as needed        Exam:  BP 109/61  Pulse 104  Temp(Src) 99.2 F (37.3 C) (Oral)  Resp 18  SpO2 97% Gen: Well NAD HEENT: EOMI,  MMM nontender maxillary sinuses bilaterally. Tympanic membranes are normal appearing bilaterally. Normal nasal turbinates. Posterior pharynx with cobblestoning. Lungs: Normal work of breathing. Rhonchi and wheezing bilaterally Heart: RRR no MRG Abd: NABS, Soft. NT, ND Exts: Brisk capillary refill, warm and well perfused.   Patient was given a DuoNeb nebulizer treatment and felt better and had normalization of lung exam.  No results found for this or any previous visit (from the past 24 hour(s)). No results found.  Assessment and Plan: 53 y.o. female with bronchitis. Plan to treat with prednisone albuterol azithromycin and codeine containing cough medication.  Discussed warning signs or  symptoms. Please see discharge instructions. Patient expresses understanding.    Gregor Hams, MD 09/07/13 [phone]

## 2013-09-07 NOTE — ED Notes (Signed)
C/o headache, nasal and chest congestion, poor appetite, cough and dizziness onset 2 weeks ago.  States pain is behind her eyes.   She thinks she had fever initially because she had sore throat and night sweats.

## 2013-11-16 ENCOUNTER — Ambulatory Visit (INDEPENDENT_AMBULATORY_CARE_PROVIDER_SITE_OTHER): Payer: 59

## 2013-11-16 VITALS — BP 95/58 | HR 84 | Resp 16 | Ht 60.0 in | Wt 141.0 lb

## 2013-11-16 DIAGNOSIS — M79671 Pain in right foot: Secondary | ICD-10-CM

## 2013-11-16 DIAGNOSIS — M722 Plantar fascial fibromatosis: Secondary | ICD-10-CM

## 2013-11-16 DIAGNOSIS — M79609 Pain in unspecified limb: Secondary | ICD-10-CM

## 2013-11-16 MED ORDER — MELOXICAM 15 MG PO TABS: 15.0000 mg | ORAL_TABLET | Freq: Every day | ORAL | Status: DC

## 2013-11-16 NOTE — Progress Notes (Signed)
Subjective:    Patient ID: Phylliss Bob, female    DOB: 04/01/1960, 53 y.o.   MRN: [id number]  HPI Comments: "I have a knot"  Patient c/o aching plantar arch for 1-2 months. There is a soft knot. Hasn't changed in size. Pain worse at night. Tried Tylenol.     Review of Systems  Constitutional: Positive for chills, diaphoresis, fatigue and unexpected weight change.  HENT: Positive for sinus pressure.   Eyes: Positive for redness.  Respiratory: Positive for cough and wheezing.   Gastrointestinal: Positive for nausea and abdominal distention.  Musculoskeletal: Positive for back pain and myalgias.  Skin:       Change in nails   Allergic/Immunologic: Positive for environmental allergies and food allergies.  Neurological: Positive for headaches.  All other systems reviewed and are negative.      Objective:   Physical Exam 53 year old American female well-developed well-nourished oriented x3 presents for couple month history of pain in 2 areas one dorsal first metatarsal cuneiform base on the right foot more so than left both feet have dorsal spurring at Lisfranc joint. The second concern is pain and a nodule medial band of the plantar fascia just proximal of sesamoids of the right there is tenderness along the entire medial band of the fascia palpation. Left foot is asymptomatic.  Vascular status is intact with pedal pulses palpable DP and PT +2/4 bilateral capillary refill time 3 seconds all digits epicritic and proprioceptive sensations intact and symmetric on Semmes Weinstein testing there is normal plantar response DTRs not listed neurologically skin color pigment normal hair growth absent nails unremarkable orthopedic biomechanical exam rectus foot type ankle mid tarsus subtalar joint motions normal there is palpation over the Lisfranc's dorsal first met cuneiform joint right and painful soft tissue nodule medial band of the fascia noted on right foot x-rays reveal rectus foot  type no fractures no osseous abnormalities no cyst or tumors or well-developed inferior retrocalcaneal spurring mild fascial thickening noted and there is dorsal spurring of the first met cuneiform articulation site right more so than left noted. No open wounds the ulcers no secondary infection is noted no history of injury or trauma to       Assessment & Plan:  Assessment plantar fasciitis/heel spur syndrome possible fibromatosis nodular lesion of the medial band of the plantar fascia right. Patient also has capsulitis and some arthropathy and peritubular spurring of Lisfranc's joint first met cuneiform articulation. Plan at this time patient placed on a regimen of MOBIC for meloxicam 15 mg once daily recommend warm compress ice pack to both areas dorsal plantar fascial strapping applied to the foot patient has been wearing this flats or flimsy shoes otherwise sometimes athletic or walking shoes are utilized however recommended a good stable shoes consider crocs for around the house no barefoot no flimsy shoes or flip-flops fascial strapping applied maintain for 5 days as instructed reappointed 2 weeks for reevaluation may be candidate for orthoses in the future. If no improvement we'll discuss and consider steroid injection for the fibromatosis as well as the dorsal exostoses may require surgical intervention at some point future.  Harriet Masson DPM

## 2013-11-16 NOTE — Patient Instructions (Addendum)
Plantar Fasciitis Plantar fasciitis is a common condition that causes foot pain. It is soreness (inflammation) of the band of tough fibrous tissue on the bottom of the foot that runs from the heel bone (calcaneus) to the ball of the foot. The cause of this soreness may be from excessive standing, poor fitting shoes, running on hard surfaces, being overweight, having an abnormal walk, or overuse (this is common in runners) of the painful foot or feet. It is also common in aerobic exercise dancers and ballet dancers. SYMPTOMS  Most people with plantar fasciitis complain of:  Severe pain in the morning on the bottom of their foot especially when taking the first steps out of bed. This pain recedes after a few minutes of walking.  Severe pain is experienced also during walking following a long period of inactivity.  Pain is worse when walking barefoot or up stairs DIAGNOSIS   Your caregiver will diagnose this condition by examining and feeling your foot.  Special tests such as X-rays of your foot, are usually not needed. PREVENTION   Consult a sports medicine professional before beginning a new exercise program.  Walking programs offer a good workout. With walking there is a lower chance of overuse injuries common to runners. There is less impact and less jarring of the joints.  Begin all new exercise programs slowly. If problems or pain develop, decrease the amount of time or distance until you are at a comfortable level.  Wear good shoes and replace them regularly.  Stretch your foot and the heel cords at the back of the ankle (Achilles tendon) both before and after exercise.  Run or exercise on even surfaces that are not hard. For example, asphalt is better than pavement.  Do not run barefoot on hard surfaces.  If using a treadmill, vary the incline.  Do not continue to workout if you have foot or joint problems. Seek professional help if they do not improve. HOME CARE INSTRUCTIONS     Avoid activities that cause you pain until you recover.  Use ice or cold packs on the problem or painful areas after working out.  Only take over-the-counter or prescription medicines for pain, discomfort, or fever as directed by your caregiver.  Soft shoe inserts or athletic shoes with air or gel sole cushions may be helpful.  If problems continue or become more severe, consult a sports medicine caregiver or your own health care provider. Cortisone is a potent anti-inflammatory medication that may be injected into the painful area. You can discuss this treatment with your caregiver. MAKE SURE YOU:   Understand these instructions.  Will watch your condition.  Will get help right away if you are not doing well or get worse. Document Released: 11/05/2000 Document Revised: 05/05/2011 Document Reviewed: 01/05/2008 [employer] Patient Information 2015 [employer]. This information is not intended to replace advice given to you by your health care provider. Make sure you discuss any questions you have with your health care provider.    ICE INSTRUCTIONS  Apply ice or cold pack to the affected area at least 3 times a day for 10-15 minutes each time.  You should also use ice after prolonged activity or vigorous exercise.  Do not apply ice longer than 20 minutes at one time.  Always keep a cloth between your skin and the ice pack to prevent burns.  Being consistent and following these instructions will help control your symptoms.  We suggest you purchase a gel ice pack because they are   reusable and do bit leak.  Some of them are designed to wrap around the area.  Use the method that works best for you.  Here are some other suggestions for icing.   Use a frozen bag of peas or corn-inexpensive and molds well to your body, usually stays frozen for 10 to 20 minutes.  Wet a towel with cold water and squeeze out the excess until it's damp.  Place in a bag in the freezer for 20 minutes. Then remove  and use.    Alternate applying warm compress and ice pack 10 minutes the feet 10 minutes of ice repeat 2 or 3 times every evening, hot cold hot cold.Marland KitchenMarland Kitchen

## 2013-12-06 ENCOUNTER — Ambulatory Visit: Payer: 59

## 2013-12-20 ENCOUNTER — Ambulatory Visit: Payer: 59

## 2014-03-22 ENCOUNTER — Other Ambulatory Visit: Payer: Self-pay

## 2014-03-22 DIAGNOSIS — Z1231 Encounter for screening mammogram for malignant neoplasm of breast: Secondary | ICD-10-CM

## 2014-03-31 ENCOUNTER — Ambulatory Visit (INDEPENDENT_AMBULATORY_CARE_PROVIDER_SITE_OTHER): Payer: 59 | Admitting: Gynecology

## 2014-03-31 ENCOUNTER — Ambulatory Visit
Admission: RE | Admit: 2014-03-31 | Discharge: 2014-03-31 | Disposition: A | Discharge status: Home / Self Care | Payer: 59 | Source: Ambulatory Visit

## 2014-03-31 ENCOUNTER — Encounter: Payer: Self-pay | Admitting: Gynecology

## 2014-03-31 VITALS — BP 110/70 | Ht 60.0 in | Wt 136.0 lb

## 2014-03-31 DIAGNOSIS — Z1231 Encounter for screening mammogram for malignant neoplasm of breast: Secondary | ICD-10-CM

## 2014-03-31 DIAGNOSIS — Z01419 Encounter for gynecological examination (general) (routine) without abnormal findings: Secondary | ICD-10-CM

## 2014-03-31 MED ORDER — FLUCONAZOLE 150 MG PO TABS: 150.0000 mg | ORAL_TABLET | Freq: Once | ORAL | Status: DC

## 2014-03-31 NOTE — Patient Instructions (Signed)
Make sure you discuss your weight loss with your primary physician.   Consider Stop Smoking.  Help is available at [facility] smoking cessation program @ www.[place].com or (937)126-0343. OR 1-800-QUIT-NOW (414)240-2306) for free smoking cessation counseling.  Smokefree.gov (Inrails.tn) provides free, accurate, evidence-based information and professional assistance to help support the immediate and long-term needs of people trying to quit smoking.    Smoking Hazards Smoking cigarettes is extremely bad for your health. Tobacco smoke has over 200 known poisons in it. There are over 60 chemicals in tobacco smoke that cause cancer. Some of the chemicals found in cigarette smoke include:  Cyanide.  Benzene.  Formaldehyde.  Methanol (wood alcohol).  Acetylene (fuel used in welding torches).  Ammonia.  Cigarette smoke also contains the poisonous gases nitrogen oxide and carbon monoxide.  Cigarette smokers have an increased risk of many serious medical problems, including: Lung cancer.  Lung disease (such as pneumonia, bronchitis, and emphysema).  Heart attack and chest pain due to the heart not getting enough oxygen (angina).  Heart disease and peripheral blood vessel disease.  Hypertension.  Stroke.  Oral cancer (cancer of the lip, mouth, or voice box).  Bladder cancer.  Pancreatic cancer.  Cervical cancer.  Pregnancy complications, including premature birth.  Low birthweight babies.  Early menopause.  Lower estrogen level for women.  Infertility.  Facial wrinkles.  Blindness.  Increased risk of broken bones (fractures).  Senile dementia.  Stillbirths and smaller newborn babies, birth defects, and genetic damage to sperm.  Stomach ulcers and internal bleeding.  Children of smokers have an increased risk of the following, because of secondhand smoke exposure:  Sudden infant death syndrome (SIDS).  Respiratory infections.  Lung cancer.  Heart disease.   Ear infections.  Smoking causes approximately: 90% of all lung cancer deaths in men.  80% of all lung cancer deaths in women.  90% of deaths from chronic obstructive lung disease.  Compared with nonsmokers, smoking increases the risk of: Coronary heart disease by 2 to 4 times.  Stroke by 2 to 4 times.  Men developing lung cancer by 23 times.  Women developing lung cancer by 13 times.  Dying from chronic obstructive lung diseases by 12 times.  Someone who smokes 2 packs a day loses about 8 years of his or her life. Even smoking lightly shortens your life expectancy by several years. You can greatly reduce the risk of medical problems for you and your family by stopping now. Smoking is the most preventable cause of death and disease in our society. Within days of quitting smoking, your circulation returns to normal, you decrease the risk of having a heart attack, and your lung capacity improves. There may be some increased phlegm in the first few days after quitting, and it may take months for your lungs to clear up completely. Quitting for 10 years cuts your lung cancer risk to almost that of a nonsmoker. WHY IS SMOKING ADDICTIVE? Nicotine is the chemical agent in tobacco that is capable of causing addiction or dependence.  When you smoke and inhale, nicotine is absorbed rapidly into the bloodstream through your lungs. Nicotine absorbed through the lungs is capable of creating a powerful addiction. Both inhaled and non-inhaled nicotine may be addictive.  Addiction studies of cigarettes and spit tobacco show that addiction to nicotine occurs mainly during the teen years, when young people begin using tobacco products.  WHAT ARE THE BENEFITS OF QUITTING?  There are many health benefits to quitting smoking.  Likelihood of  developing cancer and heart disease decreases. Health improvements are seen almost immediately.  Blood pressure, pulse rate, and breathing patterns start returning to normal soon  after quitting.  People who quit may see an improvement in their overall quality of life.  Some people choose to quit all at once. Other options include nicotine replacement products, such as patches, gum, and nasal sprays. Do not use these products without first checking with your caregiver. QUITTING SMOKING It is not easy to quit smoking. Nicotine is addicting, and longtime habits are hard to change. To start, you can write down all your reasons for quitting, tell your family and friends you want to quit, and ask for their help. Throw your cigarettes away, chew gum or cinnamon sticks, keep your hands busy, and drink extra water or juice. Go for walks and practice deep breathing to relax. Think of all the money you are saving: around $1,000 a year, for the average pack-a-day smoker. Nicotine patches and gum have been shown to improve success at efforts to stop smoking. Zyban (bupropion) is an anti-depressant drug that can be prescribed to reduce nicotine withdrawal symptoms and to suppress the urge to smoke. Smoking is an addiction with both physical and psychological effects. Joining a stop-smoking support group can help you cope with the emotional issues. For more information and advice on programs to stop smoking, call your doctor, your local hospital, or these organizations: American Lung Association - 1-800-LUNGUSA   Smoking Cessation  This document explains the best ways for you to quit smoking and new treatments to help. It lists new medicines that can double or triple your chances of quitting and quitting for good. It also considers ways to avoid relapses and concerns you may have about quitting, including weight gain. NICOTINE: A POWERFUL ADDICTION If you have tried to quit smoking, you know how hard it can be. It is hard because nicotine is a very addictive drug. For some people, it can be as addictive as heroin or cocaine. Usually, people make 2 or 3 tries, or more, before finally being able  to quit. Each time you try to quit, you can learn about what helps and what hurts. Quitting takes hard work and a lot of effort, but you can quit smoking. QUITTING SMOKING IS ONE OF THE MOST IMPORTANT THINGS YOU WILL EVER DO.  You will live longer, feel better, and live better.   The impact on your body of quitting smoking is felt almost immediately:   Within 20 minutes, blood pressure decreases. Pulse returns to its normal level.   After 8 hours, carbon monoxide levels in the blood return to normal. Oxygen level increases.   After 24 hours, chance of heart attack starts to decrease. Breath, hair, and body stop smelling like smoke.   After 48 hours, damaged nerve endings begin to recover. Sense of taste and smell improve.   After 72 hours, the body is virtually free of nicotine. Bronchial tubes relax and breathing becomes easier.   After 2 to 12 weeks, lungs can hold more air. Exercise becomes easier and circulation improves.   Quitting will reduce your risk of having a heart attack, stroke, cancer, or lung disease:   After 1 year, the risk of coronary heart disease is cut in half.   After 5 years, the risk of stroke falls to the same as a nonsmoker.   After 10 years, the risk of lung cancer is cut in half and the risk of other cancers decreases significantly.  After 15 years, the risk of coronary heart disease drops, usually to the level of a nonsmoker.   If you are pregnant, quitting smoking will improve your chances of having a healthy baby.   The people you live with, especially your children, will be healthier.   You will have extra money to spend on things other than cigarettes.  FIVE KEYS TO QUITTING Studies have shown that these 5 steps will help you quit smoking and quit for good. You have the best chances of quitting if you use them together: 1. Get ready.  2. Get support and encouragement.  3. Learn new skills and behaviors.  4. Get medicine to reduce your nicotine  addiction and use it correctly.  5. Be prepared for relapse or difficult situations. Be determined to continue trying to quit, even if you do not succeed at first.  1. GET READY  Set a quit date.   Change your environment.   Get rid of ALL cigarettes, ashtrays, matches, and lighters in your home, car, and place of work.   Do not let people smoke in your home.   Review your past attempts to quit. Think about what worked and what did not.   Once you quit, do not smoke. NOT EVEN A PUFF!  2. GET SUPPORT AND ENCOURAGEMENT Studies have shown that you have a better chance of being successful if you have help. You can get support in many ways.  Tell your family, friends, and coworkers that you are going to quit and need their support. Ask them not to smoke around you.   Talk to your caregivers (doctor, dentist, nurse, pharmacist, psychologist, and/or smoking counselor).   Get individual, group, or telephone counseling and support. The more counseling you have, the better your chances are of quitting. Programs are available at [employer] and health centers. Call your local health department for information about programs in your area.   Spiritual beliefs and practices may help some smokers quit.   Quit meters are Insurance underwriter that keep track of quit statistics, such as amount of "quit-time," cigarettes not smoked, and money saved.   Many smokers find one or more of the many self-help books available useful in helping them quit and stay off tobacco.  3. LEARN NEW SKILLS AND BEHAVIORS  Try to distract yourself from urges to smoke. Talk to someone, go for a walk, or occupy your time with a task.   When you first try to quit, change your routine. Take a different route to work. Drink tea instead of coffee. Eat breakfast in a different place.   Do something to reduce your stress. Take a hot bath, exercise, or read a book.   Plan something enjoyable to do  every day. Reward yourself for not smoking.   Explore interactive web-based programs that specialize in helping you quit.  4. GET MEDICINE AND USE IT CORRECTLY Medicines can help you stop smoking and decrease the urge to smoke. Combining medicine with the above behavioral methods and support can quadruple your chances of successfully quitting smoking. The [place] Food and Drug Administration (FDA) has approved 7 medicines to help you quit smoking. These medicines fall into 3 categories.  Nicotine replacement therapy (delivers nicotine to your body without the negative effects and risks of smoking):   Nicotine gum: Available over-the-counter.   Nicotine lozenges: Available over-the-counter.   Nicotine inhaler: Available by prescription.   Nicotine nasal spray: Available by prescription.   Nicotine skin  patches (transdermal): Available by prescription and over-the-counter.   Antidepressant medicine (helps people abstain from smoking, but how this works is unknown):   Bupropion sustained-release (SR) tablets: Available by prescription.   Nicotinic receptor partial agonist (simulates the effect of nicotine in your brain):   Varenicline tartrate tablets: Available by prescription.   Ask your caregiver for advice about which medicines to use and how to use them. Carefully read the information on the package.   Everyone who is trying to quit may benefit from using a medicine. If you are pregnant or trying to become pregnant, nursing an infant, you are under age 37, or you smoke fewer than 10 cigarettes per day, talk to your caregiver before taking any nicotine replacement medicines.   You should stop using a nicotine replacement product and call your caregiver if you experience nausea, dizziness, weakness, vomiting, fast or irregular heartbeat, mouth problems with the lozenge or gum, or redness or swelling of the skin around the patch that does not go away.   Do not use any other product  containing nicotine while using a nicotine replacement product.   Talk to your caregiver before using these products if you have diabetes, heart disease, asthma, stomach ulcers, you had a recent heart attack, you have high blood pressure that is not controlled with medicine, a history of irregular heartbeat, or you have been prescribed medicine to help you quit smoking.  5. BE PREPARED FOR RELAPSE OR DIFFICULT SITUATIONS  Most relapses occur within the first 3 months after quitting. Do not be discouraged if you start smoking again. Remember, most people try several times before they finally quit.   You may have symptoms of withdrawal because your body is used to nicotine. You may crave cigarettes, be irritable, feel very hungry, cough often, get headaches, or have difficulty concentrating.   The withdrawal symptoms are only temporary. They are strongest when you first quit, but they will go away within 10 to 14 days.  Here are some difficult situations to watch for:  Alcohol. Avoid drinking alcohol. Drinking lowers your chances of successfully quitting.   Caffeine. Try to reduce the amount of caffeine you consume. It also lowers your chances of successfully quitting.   Other smokers. Being around smoking can make you want to smoke. Avoid smokers.   Weight gain. Many smokers will gain weight when they quit, usually less than 10 pounds. Eat a healthy diet and stay active. Do not let weight gain distract you from your main goal, quitting smoking. Some medicines that help you quit smoking may also help delay weight gain. You can always lose the weight gained after you quit.   Bad mood or depression. There are a lot of ways to improve your mood other than smoking.  If you are having problems with any of these situations, talk to your caregiver. SPECIAL SITUATIONS AND CONDITIONS Studies suggest that everyone can quit smoking. Your situation or condition can give you a special reason to  quit.  Pregnant women/new mothers: By quitting, you protect your baby's health and your own.   Hospitalized patients: By quitting, you reduce health problems and help healing.   Heart attack patients: By quitting, you reduce your risk of a second heart attack.   Lung, head, and neck cancer patients: By quitting, you reduce your chance of a second cancer.   Parents of children and adolescents: By quitting, you protect your children from illnesses caused by secondhand smoke.  QUESTIONS TO THINK ABOUT Think about  the following questions before you try to stop smoking. You may want to talk about your answers with your caregiver.  Why do you want to quit?   If you tried to quit in the past, what helped and what did not?   What will be the most difficult situations for you after you quit? How will you plan to handle them?   Who can help you through the tough times? Your family? Friends? Caregiver?   What pleasures do you get from smoking? What ways can you still get pleasure if you quit?  Here are some questions to ask your caregiver:  How can you help me to be successful at quitting?   What medicine do you think would be best for me and how should I take it?   What should I do if I need more help?   What is smoking withdrawal like? How can I get information on withdrawal?  Quitting takes hard work and a lot of effort, but you can quit smoking.  You may obtain a copy of any labs that were done today by logging onto MyChart as outlined in the instructions provided with your AVS (after visit summary). The office will not call with normal lab results but certainly if there are any significant abnormalities then we will contact you.   Health Maintenance, Female A healthy lifestyle and preventative care can promote health and wellness.  Maintain regular health, dental, and eye exams.  Eat a healthy diet. Foods like vegetables, fruits, whole grains, low-fat dairy products, and lean  protein foods contain the nutrients you need without too many calories. Decrease your intake of foods high in solid fats, added sugars, and salt. Get information about a proper diet from your caregiver, if necessary.  Regular physical exercise is one of the most important things you can do for your health. Most adults should get at least 150 minutes of moderate-intensity exercise (any activity that increases your heart rate and causes you to sweat) each week. In addition, most adults need muscle-strengthening exercises on 2 or more days a week.   Maintain a healthy weight. The body mass index (BMI) is a screening tool to identify possible weight problems. It provides an estimate of body fat based on height and weight. Your caregiver can help determine your BMI, and can help you achieve or maintain a healthy weight. For adults 20 years and older:  A BMI below 18.5 is considered underweight.  A BMI of 18.5 to 24.9 is normal.  A BMI of 25 to 29.9 is considered overweight.  A BMI of 30 and above is considered obese.  Maintain normal blood lipids and cholesterol by exercising and minimizing your intake of saturated fat. Eat a balanced diet with plenty of fruits and vegetables. Blood tests for lipids and cholesterol should begin at age 53 and be repeated every 5 years. If your lipid or cholesterol levels are high, you are over 50, or you are a high risk for heart disease, you may need your cholesterol levels checked more frequently.Ongoing high lipid and cholesterol levels should be treated with medicines if diet and exercise are not effective.  If you smoke, find out from your caregiver how to quit. If you do not use tobacco, do not start.  Lung cancer screening is recommended for adults aged 45 80 years who are at high risk for developing lung cancer because of a history of smoking. Yearly low-dose computed tomography (CT) is recommended for people who have at  least a 30-pack-year history of smoking  and are a current smoker or have quit within the past 15 years. A pack year of smoking is smoking an average of 1 pack of cigarettes a day for 1 year (for example: 1 pack a day for 30 years or 2 packs a day for 15 years). Yearly screening should continue until the smoker has stopped smoking for at least 15 years. Yearly screening should also be stopped for people who develop a health problem that would prevent them from having lung cancer treatment.  If you are pregnant, do not drink alcohol. If you are breastfeeding, be very cautious about drinking alcohol. If you are not pregnant and choose to drink alcohol, do not exceed 1 drink per day. One drink is considered to be 12 ounces (355 mL) of beer, 5 ounces (148 mL) of wine, or 1.5 ounces (44 mL) of liquor.  Avoid use of street drugs. Do not share needles with anyone. Ask for help if you need support or instructions about stopping the use of drugs.  High blood pressure causes heart disease and increases the risk of stroke. Blood pressure should be checked at least every 1 to 2 years. Ongoing high blood pressure should be treated with medicines, if weight loss and exercise are not effective.  If you are 67 to 56 years old, ask your caregiver if you should take aspirin to prevent strokes.  Diabetes screening involves taking a blood sample to check your fasting blood sugar level. This should be done once every 3 years, after age 67, if you are within normal weight and without risk factors for diabetes. Testing should be considered at a younger age or be carried out more frequently if you are overweight and have at least 1 risk factor for diabetes.  Breast cancer screening is essential preventative care for women. You should practice "breast self-awareness." This means understanding the normal appearance and feel of your breasts and may include breast self-examination. Any changes detected, no matter how small, should be reported to a caregiver. Women in  their 66s and 30s should have a clinical breast exam (CBE) by a caregiver as part of a regular health exam every 1 to 3 years. After age 24, women should have a CBE every year. Starting at age 61, women should consider having a mammogram (breast X-ray) every year. Women who have a family history of breast cancer should talk to their caregiver about genetic screening. Women at a high risk of breast cancer should talk to their caregiver about having an MRI and a mammogram every year.  Breast cancer gene (BRCA)-related cancer risk assessment is recommended for women who have family members with BRCA-related cancers. BRCA-related cancers include breast, ovarian, tubal, and peritoneal cancers. Having family members with these cancers may be associated with an increased risk for harmful changes (mutations) in the breast cancer genes BRCA1 and BRCA2. Results of the assessment will determine the need for genetic counseling and BRCA1 and BRCA2 testing.  The Pap test is a screening test for cervical cancer. Women should have a Pap test starting at age 46. Between ages 52 and 67, Pap tests should be repeated every 2 years. Beginning at age 36, you should have a Pap test every 3 years as long as the past 3 Pap tests have been normal. If you had a hysterectomy for a problem that was not cancer or a condition that could lead to cancer, then you no longer need Pap tests. If you  are between ages 50 and 67, and you have had normal Pap tests going back 10 years, you no longer need Pap tests. If you have had past treatment for cervical cancer or a condition that could lead to cancer, you need Pap tests and screening for cancer for at least 20 years after your treatment. If Pap tests have been discontinued, risk factors (such as a new sexual partner) need to be reassessed to determine if screening should be resumed. Some women have medical problems that increase the chance of getting cervical cancer. In these cases, your caregiver  may recommend more frequent screening and Pap tests.  The human papillomavirus (HPV) test is an additional test that may be used for cervical cancer screening. The HPV test looks for the virus that can cause the cell changes on the cervix. The cells collected during the Pap test can be tested for HPV. The HPV test could be used to screen women aged 64 years and older, and should be used in women of any age who have unclear Pap test results. After the age of 85, women should have HPV testing at the same frequency as a Pap test.  Colorectal cancer can be detected and often prevented. Most routine colorectal cancer screening begins at the age of 66 and continues through age 48. However, your caregiver may recommend screening at an earlier age if you have risk factors for colon cancer. On a yearly basis, your caregiver may provide home test kits to check for hidden blood in the stool. Use of a small camera at the end of a tube, to directly examine the colon (sigmoidoscopy or colonoscopy), can detect the earliest forms of colorectal cancer. Talk to your caregiver about this at age 7, when routine screening begins. Direct examination of the colon should be repeated every 5 to 10 years through age 20, unless early forms of pre-cancerous polyps or small growths are found.  Hepatitis C blood testing is recommended for all people born from [birth year] through 1966 and any individual with known risks for hepatitis C.  Practice safe sex. Use condoms and avoid high-risk sexual practices to reduce the spread of sexually transmitted infections (STIs). Sexually active women aged 50 and younger should be checked for Chlamydia, which is a common sexually transmitted infection. Older women with new or multiple partners should also be tested for Chlamydia. Testing for other STIs is recommended if you are sexually active and at increased risk.  Osteoporosis is a disease in which the bones lose minerals and strength with aging. This  can result in serious bone fractures. The risk of osteoporosis can be identified using a bone density scan. Women ages 50 and over and women at risk for fractures or osteoporosis should discuss screening with their caregivers. Ask your caregiver whether you should be taking a calcium supplement or vitamin D to reduce the rate of osteoporosis.  Menopause can be associated with physical symptoms and risks. Hormone replacement therapy is available to decrease symptoms and risks. You should talk to your caregiver about whether hormone replacement therapy is right for you.  Use sunscreen. Apply sunscreen liberally and repeatedly throughout the day. You should seek shade when your shadow is shorter than you. Protect yourself by wearing long sleeves, pants, a wide-brimmed hat, and sunglasses year round, whenever you are outdoors.  Notify your caregiver of new moles or changes in moles, especially if there is a change in shape or color. Also notify your caregiver if a mole is larger  than the size of a pencil eraser.  Stay current with your immunizations. Document Released: 08/26/2010 Document Revised: 06/07/2012 Document Reviewed: 08/26/2010 [facility] Patient Information 2014 [place].

## 2014-03-31 NOTE — Progress Notes (Signed)
Joyce MediciBonni E Harris 1960-06-03 [id number]        54 y.o.  [id number] for annual exam.  Several issues noted below.  Past medical history,surgical history, problem list, medications, allergies, family history and social history were all reviewed and documented as reviewed in the EPIC chart.  ROS:  Performed with pertinent positives and negatives included in the history, assessment and plan.   Additional significant findings :  none   Exam: [facility] personassistant Filed Vitals:   03/31/14 1051  BP: 110/70  Height: 5' (1.524 m)  Weight: 136 lb (61.689 kg)   General appearance:  Normal affect, orientation and appearance. Skin: Grossly normal HEENT: Without gross lesions.  No cervical or supraclavicular adenopathy. Thyroid normal.  Lungs:  Clear without wheezing, rales or rhonchi Cardiac: RR, without RMG Abdominal:  Soft, nontender, without masses, guarding, rebound, organomegaly or hernia Breasts:  Examined lying and sitting without masses, retractions, discharge or axillary adenopathy. Pelvic:  Ext/BUS/vagina with atrophic changes  Adnexa  Without masses or tenderness    Anus and perineum  Normal   Rectovaginal  Normal sphincter tone without palpated masses or tenderness.    Assessment/Plan:  54 y.o. [id number] female for annual exam.   1. Postmenopausal/atrophic genital changes.  Status post LAVH BSO 2009 for benign indications. Doing well without significant hot flashes night sweats or vaginal dryness.  Continue to monitor. 2. Weight loss. Patient notes approximately 20 pound weight loss over the past year. Although looking at her last year's weight of 147 it's been 11 pounds. No localizing symptoms such as nausea vomiting diarrhea constipation lightheadedness chest pain cough sputum shortness of breath abdominal pain rectal bleeding rapid heartbeat skin and hair changes. Says she just doesn't feel like eating as much. Has appointment with her primary physician next month and I asked the patient to  follow up with her to make sure she discusses this and is evaluated for this and the patient agrees to do so. 3. Mammogram 03/2014. Continue with annual mammography. SBE monthly reviewed. 4. Colonoscopy 2014. Repeat at their recommended interval. 5. Pap smear 02/2013 negative. No Pap smear done today. Options to stop screening altogether she is status post hysterectomy for benign indications versus less frequent screening intervals reviewed. Will readdress on an annual basis. 6. DEXA 2009 normal. Will repeat further into the menopause. Increased calcium vitamin D reviewed. 7. Stop smoking again discussed and encouraged. 8. Health maintenance. No routine blood work done as she is actively followed by her primary physician. Follow up in one year, sooner as needed.     Dara LordsFONTAINE,Marshia Tropea P MD, 11:15 AM 03/31/2014

## 2014-04-01 LAB — URINALYSIS W MICROSCOPIC + REFLEX CULTURE
BILIRUBIN URINE: NEGATIVE
Bacteria, UA: NONE SEEN
Casts: NONE SEEN
Crystals: NONE SEEN
Glucose, UA: NEGATIVE mg/dL
HGB URINE DIPSTICK: NEGATIVE
KETONES UR: NEGATIVE mg/dL
LEUKOCYTES UA: NEGATIVE
Nitrite: NEGATIVE
PH: 5.5 (ref 5.0–8.0)
PROTEIN: NEGATIVE mg/dL
Specific Gravity, Urine: 1.017 (ref 1.005–1.030)
Squamous Epithelial / LPF: NONE SEEN
Urobilinogen, UA: 0.2 mg/dL (ref 0.0–1.0)

## 2015-04-16 ENCOUNTER — Other Ambulatory Visit (HOSPITAL_COMMUNITY): Payer: Self-pay | Admitting: *Deleted

## 2015-04-16 DIAGNOSIS — N6310 Unspecified lump in the right breast, unspecified quadrant: Secondary | ICD-10-CM

## 2015-04-20 ENCOUNTER — Ambulatory Visit
Admission: RE | Admit: 2015-04-20 | Discharge: 2015-04-20 | Disposition: A | Discharge status: Home / Self Care | Payer: No Typology Code available for payment source | Source: Ambulatory Visit | Attending: Obstetrics and Gynecology | Admitting: Obstetrics and Gynecology

## 2015-04-20 ENCOUNTER — Encounter (HOSPITAL_COMMUNITY): Payer: Self-pay

## 2015-04-20 ENCOUNTER — Ambulatory Visit (HOSPITAL_COMMUNITY)
Admission: RE | Admit: 2015-04-20 | Discharge: 2015-04-20 | Disposition: A | Discharge status: Home / Self Care | Payer: 59 | Source: Ambulatory Visit | Attending: Obstetrics and Gynecology | Admitting: Obstetrics and Gynecology

## 2015-04-20 VITALS — BP 128/84 | Temp 98.2°F | Ht 60.0 in | Wt 117.0 lb

## 2015-04-20 DIAGNOSIS — N6310 Unspecified lump in the right breast, unspecified quadrant: Secondary | ICD-10-CM

## 2015-04-20 DIAGNOSIS — Z1239 Encounter for other screening for malignant neoplasm of breast: Secondary | ICD-10-CM

## 2015-04-20 DIAGNOSIS — N644 Mastodynia: Secondary | ICD-10-CM

## 2015-04-20 DIAGNOSIS — N6315 Unspecified lump in the right breast, overlapping quadrants: Secondary | ICD-10-CM

## 2015-04-20 NOTE — Patient Instructions (Addendum)
Educational materials on self breast awareness given. Explained to [employer] that she did not need a Pap smear today due to last Pap smear was 03/07/2013. Let her know that she does not need any further Pap smears due to her history of a hysterectomy for benign reasons. Referred patient to the [facility] for diagnostic mammogram. Appointment scheduled for Friday, April 20, 2015 at 1330. Patient aware of appointment and will be there. Discussed smoking cessation with patient. Referred patient to the [facility] Quitline and gave resources to free classes offered at the [facility].  [name] verbalized understanding.  Trentan Trippe, Kathaleen Maser, RN 1:46 PM

## 2015-04-20 NOTE — Addendum Note (Signed)
Encounter addended by: Priscille Heidelberg, RN on: 04/20/2015  2:35 PM<BR>     Documentation filed: Notes Section

## 2015-04-20 NOTE — Progress Notes (Addendum)
Complaints of right breast lump x 2 weeks that had shooting pain on Monday or Tuesday that only lasted one night. Patient rated the pain at a 5 out of 10.  Pap Smear: Pap smear not completed today. Last Pap smear was 03/07/2013 at [facility] and normal. Per patient has no history of an abnormal Pap smear. Patient has a history of a hysterectomy 02/22/2007 due to fibroids. Patient no longer needs Pap smears due to her history of a hysterecomy for benign reasons due to AGOG and BCCCP guidelines. Last Pap smear result is in EPIC.  Physical exam: Breasts Breasts symmetrical. No skin abnormalities bilateral breasts. No nipple retraction bilateral breasts. No nipple discharge bilateral breasts. No lymphadenopathy. No lumps palpated left breast. Palpated a lump within the right breast at 12 o'clock 4 cm from the nipple. Complaints of right outer breast tenderness on exam. Referred patient to the [facility] for diagnostic mammogram. Appointment scheduled for Friday, April 20, 2015 at 1330.  Pelvic/Bimanual No Pap smear completed today since last Pap smear was 03/07/2013 and patient has a history of a hysterectomy for benign reasons. Pap smear not indicated per BCCCP guidelines.   Smoking History: Patient currently smokes. Discussed smoking cessation with patient. Referred patient to the [facility] Quitline and gave resources to free classes offered at the [facility].  Patient Navigation: Patient education provided. Access to services provided for patient through BCCCP program.   Colorectal Cancer Screening: Patient had a colonoscopy completed in 2015. No complaints today.

## 2015-04-20 NOTE — Addendum Note (Signed)
Encounter addended by: Priscille Heidelberg, RN on: 04/20/2015  2:12 PM<BR>     Documentation filed: Visit Diagnoses

## 2015-04-30 ENCOUNTER — Encounter (HOSPITAL_COMMUNITY): Payer: Self-pay | Admitting: *Deleted

## 2015-09-20 ENCOUNTER — Other Ambulatory Visit: Payer: Self-pay | Admitting: Nurse Practitioner

## 2015-09-20 ENCOUNTER — Ambulatory Visit
Admission: RE | Admit: 2015-09-20 | Discharge: 2015-09-20 | Disposition: A | Discharge status: Home / Self Care | Source: Ambulatory Visit | Attending: Internal Medicine | Admitting: Internal Medicine

## 2015-09-20 DIAGNOSIS — M545 Low back pain: Secondary | ICD-10-CM

## 2015-10-13 ENCOUNTER — Other Ambulatory Visit: Payer: Self-pay | Admitting: Nurse Practitioner

## 2015-10-13 DIAGNOSIS — M545 Low back pain: Secondary | ICD-10-CM

## 2015-10-25 ENCOUNTER — Other Ambulatory Visit

## 2015-10-31 ENCOUNTER — Ambulatory Visit
Admission: RE | Admit: 2015-10-31 | Discharge: 2015-10-31 | Disposition: A | Discharge status: Home / Self Care | Source: Ambulatory Visit | Attending: Nurse Practitioner | Admitting: Nurse Practitioner

## 2015-10-31 DIAGNOSIS — M545 Low back pain: Secondary | ICD-10-CM

## 2016-01-30 ENCOUNTER — Other Ambulatory Visit: Payer: Self-pay | Admitting: Nurse Practitioner

## 2016-01-30 ENCOUNTER — Ambulatory Visit
Admission: RE | Admit: 2016-01-30 | Discharge: 2016-01-30 | Disposition: A | Discharge status: Home / Self Care | Source: Ambulatory Visit | Attending: Nurse Practitioner | Admitting: Nurse Practitioner

## 2016-01-30 DIAGNOSIS — Z87891 Personal history of nicotine dependence: Secondary | ICD-10-CM

## 2016-01-30 DIAGNOSIS — R05 Cough: Secondary | ICD-10-CM

## 2016-01-30 DIAGNOSIS — R059 Cough, unspecified: Secondary | ICD-10-CM

## 2016-03-17 ENCOUNTER — Ambulatory Visit
Admission: RE | Admit: 2016-03-17 | Discharge: 2016-03-17 | Disposition: A | Discharge status: Home / Self Care | Source: Ambulatory Visit | Attending: Nurse Practitioner | Admitting: Nurse Practitioner

## 2016-03-17 ENCOUNTER — Other Ambulatory Visit: Payer: Self-pay | Admitting: Nurse Practitioner

## 2016-03-17 DIAGNOSIS — R053 Chronic cough: Secondary | ICD-10-CM

## 2016-03-17 DIAGNOSIS — R05 Cough: Secondary | ICD-10-CM

## 2016-04-03 ENCOUNTER — Other Ambulatory Visit: Payer: Self-pay | Admitting: Nurse Practitioner

## 2016-04-03 ENCOUNTER — Ambulatory Visit
Admission: RE | Admit: 2016-04-03 | Discharge: 2016-04-03 | Disposition: A | Discharge status: Home / Self Care | Source: Ambulatory Visit | Attending: Nurse Practitioner | Admitting: Nurse Practitioner

## 2016-04-03 DIAGNOSIS — R053 Chronic cough: Secondary | ICD-10-CM

## 2016-04-03 DIAGNOSIS — R05 Cough: Secondary | ICD-10-CM

## 2016-06-24 ENCOUNTER — Other Ambulatory Visit: Payer: Self-pay | Admitting: Obstetrics and Gynecology

## 2016-06-24 DIAGNOSIS — Z1231 Encounter for screening mammogram for malignant neoplasm of breast: Secondary | ICD-10-CM

## 2016-07-15 ENCOUNTER — Ambulatory Visit (HOSPITAL_COMMUNITY)

## 2016-07-15 ENCOUNTER — Encounter

## 2016-07-24 ENCOUNTER — Ambulatory Visit (HOSPITAL_COMMUNITY)

## 2016-07-24 ENCOUNTER — Encounter

## 2016-08-28 ENCOUNTER — Encounter (HOSPITAL_COMMUNITY): Payer: Self-pay

## 2016-08-28 ENCOUNTER — Emergency Department (HOSPITAL_COMMUNITY)
Admission: EM | Admit: 2016-08-28 | Discharge: 2016-08-29 | Disposition: A | Discharge status: Home / Self Care | Attending: Emergency Medicine | Admitting: Emergency Medicine

## 2016-08-28 DIAGNOSIS — Z91010 Allergy to peanuts: Secondary | ICD-10-CM | POA: Diagnosis not present

## 2016-08-28 DIAGNOSIS — F10920 Alcohol use, unspecified with intoxication, uncomplicated: Secondary | ICD-10-CM

## 2016-08-28 DIAGNOSIS — Z79899 Other long term (current) drug therapy: Secondary | ICD-10-CM | POA: Insufficient documentation

## 2016-08-28 DIAGNOSIS — Z7982 Long term (current) use of aspirin: Secondary | ICD-10-CM | POA: Insufficient documentation

## 2016-08-28 DIAGNOSIS — E119 Type 2 diabetes mellitus without complications: Secondary | ICD-10-CM | POA: Insufficient documentation

## 2016-08-28 DIAGNOSIS — Z7951 Long term (current) use of inhaled steroids: Secondary | ICD-10-CM | POA: Diagnosis not present

## 2016-08-28 DIAGNOSIS — F17210 Nicotine dependence, cigarettes, uncomplicated: Secondary | ICD-10-CM | POA: Insufficient documentation

## 2016-08-28 DIAGNOSIS — F10120 Alcohol abuse with intoxication, uncomplicated: Secondary | ICD-10-CM | POA: Insufficient documentation

## 2016-08-28 DIAGNOSIS — I10 Essential (primary) hypertension: Secondary | ICD-10-CM | POA: Insufficient documentation

## 2016-08-28 DIAGNOSIS — F419 Anxiety disorder, unspecified: Secondary | ICD-10-CM | POA: Insufficient documentation

## 2016-08-28 DIAGNOSIS — F410 Panic disorder [episodic paroxysmal anxiety] without agoraphobia: Secondary | ICD-10-CM

## 2016-08-28 MED ORDER — LORAZEPAM 1 MG PO TABS
1.0000 mg | ORAL_TABLET | Freq: Once | ORAL | Status: AC
  Administered 2016-08-29: 1 mg via ORAL
  Filled 2016-08-28: qty 1

## 2016-08-28 NOTE — ED Provider Notes (Signed)
[facility]-EMERGENCY DEPT Provider Note   CSN: [id number] Arrival date & time: 08/28/16  2258 By signing my name below, I, Levon Hedger, attest that this documentation has been prepared under the direction and in the presence of Geoffery Lyons, MD . Electronically Signed: Levon Hedger, Scribe. 08/28/2016. 11:30 PM.   History   Chief Complaint Chief Complaint  Patient presents with  . Anxiety   HPI [name] is a 56 y.o. female with a history of fibromyalgia, HTN/HLD, and DM who presents to the Emergency Department complaining of sudden onset, constant, gradually improving anxiety onset tonight. Pt states her anxiety was triggered tonight, but is unsure what triggered her anxiety. She reports associated shortness of breath, tremors, and palpitations. Per pt, this feels like prior panic attacks, but she has not had them in several years. No OTC treatments tried for these symptoms PTA. She does not have any home anxiety medication. She endorses alcohol consumption tonight, but denies any illicit drug use. She denies any SI or HI. Pt has no other acute complaints or associated symptoms at this time.    The history is provided by the patient. No language interpreter was used.   Past Medical History:  Diagnosis Date  . Back pain   . Diabetes mellitus   . Fibromyalgia   . High cholesterol   . Hypertension     Patient Active Problem List   Diagnosis Date Noted  . Shoulder pain 08/26/2011  . Fibromyalgia   . LATERAL EPICONDYLITIS, BILATERAL 05/25/2008  . STRESS FRACTURE, FOOT 05/25/2008    Past Surgical History:  Procedure Laterality Date  . HYSTEROSCOPY  11/07   D&C  . OOPHORECTOMY     BSO  . TUBAL LIGATION  1991  . VAGINAL HYSTERECTOMY  01/2008   LAVH BSO    OB History    Gravida Para Term Preterm AB Living   6 4 4   2 4    SAB TAB Ectopic Multiple Live Births   2               Home Medications    Prior to Admission medications   Medication Sig Start Date End Date  Taking? Authorizing Provider  aspirin 81 MG chewable tablet Chew 81 mg by mouth daily.   Yes [provider]  beclomethasone (QVAR) 80 MCG/ACT inhaler Inhale 2 puffs into the lungs 2 (two) times daily. 08/05/12  Yes Reuben Likes, MD  Calcium Carbonate-Vitamin D (CALCIUM + D PO) Take 1 tablet by mouth daily. Reported on 04/20/2015   Yes [provider]  cyclobenzaprine (FLEXERIL) 10 MG tablet Take 10 mg by mouth 3 (three) times daily as needed. Takes 1/2 of tablet as needed for muscular discomfort.   Yes [provider]  DULoxetine (CYMBALTA) 60 MG capsule Take 60 mg by mouth daily.     Yes [provider]  olmesartan (BENICAR) 5 MG tablet Take 5 mg by mouth daily.     Yes [provider]  rosuvastatin (CRESTOR) 10 MG tablet Take 10 mg by mouth daily.     Yes [provider]  fluconazole (DIFLUCAN) 150 MG tablet Take 1 tablet (150 mg total) by mouth once. As needed for yeast Patient not taking: Reported on 04/20/2015 03/31/14   Fontaine, Nadyne Coombes, MD  meloxicam (MOBIC) 15 MG tablet Take 1 tablet (15 mg total) by mouth daily. Patient not taking: Reported on 08/28/2016 11/16/13   Alvan Dame, DPM  predniSONE (DELTASONE) 10 MG tablet Take 3 tablets (  30 mg total) by mouth daily. Patient not taking: Reported on 03/31/2014 09/07/13   Rodolph Bong, MD    Family History Family History  Problem Relation Age of Onset  . Hypertension Mother   . Heart disease Mother   . Diabetes Father   . COPD Father   . Heart disease Maternal Grandmother   . Heart disease Maternal Grandfather   . Cancer Maternal Grandfather        LUNG     Social History Social History  Substance Use Topics  . Smoking status: Current Every Day Smoker    Packs/day: 0.30    Types: Cigarettes  . Smokeless tobacco: Never Used  . Alcohol use 0.0 oz/week     Comment: rare     Allergies   Peanut-containing drug products; Pollen extract; and Shellfish allergy   Review of  Systems Review of Systems All systems reviewed and are negative for acute change except as noted in the HPI.  Physical Exam Updated Vital Signs BP 128/88 (BP Location: Left Arm)   Pulse (!) 102   Temp 98.1 F (36.7 C) (Oral)   Resp 20   SpO2 98%   Physical Exam  Constitutional: She is oriented to person, place, and time. She appears well-developed and well-nourished. No distress.  Pt appears anxious and tremulous   HENT:  Head: Normocephalic and atraumatic.  Eyes: EOM are normal. Pupils are equal, round, and reactive to light.  Neck: Normal range of motion.  Cardiovascular: Normal rate, regular rhythm and normal heart sounds.   Pulmonary/Chest: Effort normal and breath sounds normal.  Abdominal: Soft. She exhibits no distension. There is no tenderness.  Musculoskeletal: Normal range of motion.  Neurological: She is alert and oriented to person, place, and time. No cranial nerve deficit. She exhibits normal muscle tone. Coordination normal.  Skin: Skin is warm and dry.  Psychiatric: She has a normal mood and affect. Judgment normal.  Nursing note and vitals reviewed.   ED Treatments / Results  DIAGNOSTIC STUDIES:  Oxygen Saturation is 98% on RA, normal by my interpretation.    COORDINATION OF CARE:  11:36 PM Discussed treatment plan with pt at bedside and pt agreed to plan.   Labs (all labs ordered are listed, but only abnormal results are displayed) Labs Reviewed  COMPREHENSIVE METABOLIC PANEL  ETHANOL  CBC  RAPID URINE DRUG SCREEN, HOSP PERFORMED  URINALYSIS, ROUTINE W REFLEX MICROSCOPIC    EKG  EKG Interpretation None       Radiology No results found.  Procedures Procedures (including critical care time)  Medications Ordered in ED Medications - No data to display   Initial Impression / Assessment and Plan / ED Course  I have reviewed the triage vital signs and the nursing notes.  Pertinent labs & imaging results that were available during my care  of the patient were reviewed by me and considered in my medical decision making (see chart for details).  Patient presents after experiencing what sounds like a panic attack. She has a history of similar episodes. Tonight's episode occurred during a particularly stressful event. She was given Ativan and is feeling much better. She does report drinking alcohol tonight and her blood alcohol was found to be 293. She has been observed for several hours and appears stable. She denies any suicidal or homicidal ideation. She denies any auditory or visual hallucinations. I see no indication for any further workup. She will be provided a small quantity of Ativan which she can take  if she experiences future episodes.  Final Clinical Impressions(s) / ED Diagnoses   Final diagnoses:  None    New Prescriptions New Prescriptions   No medications on file   I personally performed the services described in this documentation, which was scribed in my presence. The recorded information has been reviewed and is accurate.        Geoffery Lyonselo, Sybil Shrader, MD 08/29/16 [phone]

## 2016-08-28 NOTE — ED Notes (Signed)
Pt states that she's been under a lot of stress and states that she's just tired, pt is very tearful in triage

## 2016-08-28 NOTE — ED Triage Notes (Signed)
Pt complains of UTI sx for one day, burning upon urination Pt also complains of chest wall pain from a cough Pt states her anxiety is elevated tonight, no meds

## 2016-08-29 LAB — URINALYSIS, ROUTINE W REFLEX MICROSCOPIC
Bilirubin Urine: NEGATIVE
GLUCOSE, UA: NEGATIVE mg/dL
Ketones, ur: NEGATIVE mg/dL
LEUKOCYTES UA: NEGATIVE
NITRITE: NEGATIVE
PROTEIN: 30 mg/dL — AB
Specific Gravity, Urine: 1.009 (ref 1.005–1.030)
pH: 6 (ref 5.0–8.0)

## 2016-08-29 LAB — COMPREHENSIVE METABOLIC PANEL
ALT: 29 U/L (ref 14–54)
AST: 57 U/L — AB (ref 15–41)
Albumin: 4.2 g/dL (ref 3.5–5.0)
Alkaline Phosphatase: 95 U/L (ref 38–126)
Anion gap: 20 — ABNORMAL HIGH (ref 5–15)
BUN: 14 mg/dL (ref 6–20)
CO2: 19 mmol/L — ABNORMAL LOW (ref 22–32)
CREATININE: 1.01 mg/dL — AB (ref 0.44–1.00)
Calcium: 8.8 mg/dL — ABNORMAL LOW (ref 8.9–10.3)
Chloride: 104 mmol/L (ref 101–111)
GFR calc Af Amer: >60 mL/min (ref >60)
GFR calc non Af Amer: >60 mL/min (ref >60)
Glucose, Bld: 182 mg/dL — ABNORMAL HIGH (ref 65–99)
Potassium: 3.5 mmol/L (ref 3.5–5.1)
SODIUM: 143 mmol/L (ref 135–145)
Total Bilirubin: 0.4 mg/dL (ref 0.3–1.2)
Total Protein: 8 g/dL (ref 6.5–8.1)

## 2016-08-29 LAB — CBC
HEMATOCRIT: 38.3 % (ref 36.0–46.0)
Hemoglobin: 13.2 g/dL (ref 12.0–15.0)
MCH: 31 pg (ref 26.0–34.0)
MCHC: 34.5 g/dL (ref 30.0–36.0)
MCV: 89.9 fL (ref 78.0–100.0)
Platelets: 364 10*3/uL (ref 150–400)
RBC: 4.26 MIL/uL (ref 3.87–5.11)
RDW: 15.8 % — ABNORMAL HIGH (ref 11.5–15.5)
WBC: 7.5 10*3/uL (ref 4.0–10.5)

## 2016-08-29 LAB — RAPID URINE DRUG SCREEN, HOSP PERFORMED
Amphetamines: NOT DETECTED
BARBITURATES: NOT DETECTED
Benzodiazepines: NOT DETECTED
Cocaine: NOT DETECTED
Opiates: NOT DETECTED
Tetrahydrocannabinol: NOT DETECTED

## 2016-08-29 LAB — ETHANOL: Alcohol, Ethyl (B): 293 mg/dL — ABNORMAL HIGH (ref <5)

## 2016-08-29 MED ORDER — LORAZEPAM 1 MG PO TABS: 1.0000 mg | ORAL_TABLET | Freq: Three times a day (TID) | ORAL | 0 refills | Status: DC | PRN

## 2016-08-29 NOTE — Discharge Instructions (Signed)
Ativan as prescribed as needed for anxiety. Do not mix this medication with alcohol as they have additive effects.  Follow-up with your primary Dr. in the next 1-2 weeks, and return to the ER if symptoms significantly worsen or change.

## 2016-10-07 ENCOUNTER — Emergency Department (HOSPITAL_COMMUNITY)
Admission: EM | Admit: 2016-10-07 | Discharge: 2016-10-08 | Disposition: A | Discharge status: Home / Self Care | Attending: Emergency Medicine | Admitting: Emergency Medicine

## 2016-10-07 ENCOUNTER — Encounter (HOSPITAL_COMMUNITY): Payer: Self-pay | Admitting: Emergency Medicine

## 2016-10-07 ENCOUNTER — Emergency Department (HOSPITAL_COMMUNITY)

## 2016-10-07 DIAGNOSIS — G8929 Other chronic pain: Secondary | ICD-10-CM | POA: Diagnosis not present

## 2016-10-07 DIAGNOSIS — M545 Low back pain: Secondary | ICD-10-CM | POA: Insufficient documentation

## 2016-10-07 DIAGNOSIS — E119 Type 2 diabetes mellitus without complications: Secondary | ICD-10-CM | POA: Diagnosis not present

## 2016-10-07 DIAGNOSIS — Z79899 Other long term (current) drug therapy: Secondary | ICD-10-CM | POA: Insufficient documentation

## 2016-10-07 DIAGNOSIS — Z7982 Long term (current) use of aspirin: Secondary | ICD-10-CM | POA: Diagnosis not present

## 2016-10-07 DIAGNOSIS — F17210 Nicotine dependence, cigarettes, uncomplicated: Secondary | ICD-10-CM | POA: Diagnosis not present

## 2016-10-07 DIAGNOSIS — E860 Dehydration: Secondary | ICD-10-CM | POA: Insufficient documentation

## 2016-10-07 HISTORY — DX: Anxiety disorder, unspecified: F41.9

## 2016-10-07 LAB — CBC WITH DIFFERENTIAL/PLATELET
Basophils Absolute: 0 10*3/uL (ref 0.0–0.1)
Basophils Relative: 0 %
Eosinophils Absolute: 0 10*3/uL (ref 0.0–0.7)
Eosinophils Relative: 1 %
HEMATOCRIT: 39.4 % (ref 36.0–46.0)
HEMOGLOBIN: 13.2 g/dL (ref 12.0–15.0)
LYMPHS ABS: 2 10*3/uL (ref 0.7–4.0)
Lymphocytes Relative: 33 %
MCH: 31.8 pg (ref 26.0–34.0)
MCHC: 33.5 g/dL (ref 30.0–36.0)
MCV: 94.9 fL (ref 78.0–100.0)
MONOS PCT: 5 %
Monocytes Absolute: 0.3 10*3/uL (ref 0.1–1.0)
NEUTROS ABS: 3.8 10*3/uL (ref 1.7–7.7)
NEUTROS PCT: 61 %
Platelets: 270 10*3/uL (ref 150–400)
RBC: 4.15 MIL/uL (ref 3.87–5.11)
RDW: 14.8 % (ref 11.5–15.5)
WBC: 6.2 10*3/uL (ref 4.0–10.5)

## 2016-10-07 LAB — I-STAT CHEM 8, ED
BUN: 18 mg/dL (ref 6–20)
CALCIUM ION: 1 mmol/L — AB (ref 1.15–1.40)
CREATININE: 1.8 mg/dL — AB (ref 0.44–1.00)
Chloride: 99 mmol/L — ABNORMAL LOW (ref 101–111)
Glucose, Bld: 116 mg/dL — ABNORMAL HIGH (ref 65–99)
HEMATOCRIT: 42 % (ref 36.0–46.0)
Hemoglobin: 14.3 g/dL (ref 12.0–15.0)
POTASSIUM: 3.9 mmol/L (ref 3.5–5.1)
SODIUM: 136 mmol/L (ref 135–145)
TCO2: 27 mmol/L (ref 0–100)

## 2016-10-07 MED ORDER — METHOCARBAMOL 750 MG PO TABS: 750.0000 mg | ORAL_TABLET | Freq: Four times a day (QID) | ORAL | 0 refills | Status: DC

## 2016-10-07 MED ORDER — SODIUM CHLORIDE 0.9 % IV BOLUS (SEPSIS)
2000.0000 mL | Freq: Once | INTRAVENOUS | Status: AC
  Administered 2016-10-07: 2000 mL via INTRAVENOUS

## 2016-10-07 MED ORDER — SODIUM CHLORIDE 0.9 % IV SOLN: INTRAVENOUS | Status: DC

## 2016-10-07 MED ORDER — MORPHINE SULFATE (PF) 2 MG/ML IV SOLN
4.0000 mg | Freq: Once | INTRAVENOUS | Status: AC
  Administered 2016-10-07: 4 mg via INTRAMUSCULAR
  Filled 2016-10-07: qty 2

## 2016-10-07 MED ORDER — DIAZEPAM 5 MG PO TABS
5.0000 mg | ORAL_TABLET | Freq: Once | ORAL | Status: AC
  Administered 2016-10-07: 5 mg via ORAL
  Filled 2016-10-07: qty 1

## 2016-10-07 NOTE — ED Provider Notes (Signed)
[facility] DEPT Provider Note   CSN: [id number] Arrival date & time: 10/07/16  1659     History   Chief Complaint Chief Complaint  Patient presents with  . Back Pain  . Dizziness    HPI [name] is a 56 y.o. female.  56 year old female presents with worseningback pain localized to her right side with some radiation to her knee. Has a history of fibromyalgia as well as lumbar disc disease. Patient recently had a mechanical fall without head injury but did note some pain to her LS-spine. Denies any hip discomfort. Denies any foot drop. No prior bladder dysfunction. Denies any urinary symptoms. Has used her home meds without relief. Pain better with remaining still. She has a secondary complaint of being concerned about her potassium as she had low potassium 2 weeks ago. She is a question have that checked.      Past Medical History:  Diagnosis Date  . Anxiety   . Back pain   . Diabetes mellitus   . Fibromyalgia   . High cholesterol   . Hypertension     Patient Active Problem List   Diagnosis Date Noted  . Shoulder pain 08/26/2011  . Fibromyalgia   . LATERAL EPICONDYLITIS, BILATERAL 05/25/2008  . STRESS FRACTURE, FOOT 05/25/2008    Past Surgical History:  Procedure Laterality Date  . HYSTEROSCOPY  11/07   D&C  . OOPHORECTOMY     BSO  . TUBAL LIGATION  1991  . VAGINAL HYSTERECTOMY  01/2008   LAVH BSO    OB History    Gravida Para Term Preterm AB Living   6 4 4   2 4    SAB TAB Ectopic Multiple Live Births   2               Home Medications    Prior to Admission medications   Medication Sig Start Date End Date Taking? Authorizing Provider  aspirin 81 MG chewable tablet Chew 81 mg by mouth daily.   Yes [provider]  beclomethasone (QVAR) 80 MCG/ACT inhaler Inhale 2 puffs into the lungs 2 (two) times daily. 08/05/12  Yes Reuben Likes, MD  Calcium Carbonate-Vitamin D (CALCIUM + D PO) Take 1 tablet by mouth daily. Reported on  04/20/2015   Yes [provider]  cyclobenzaprine (FLEXERIL) 10 MG tablet Take 10 mg by mouth 3 (three) times daily as needed. Takes 1/2 of tablet as needed for muscular discomfort.   Yes [provider]  DULoxetine (CYMBALTA) 60 MG capsule Take 60 mg by mouth daily.     Yes [provider]  LORazepam (ATIVAN) 1 MG tablet Take 1 tablet (1 mg total) by mouth every 8 (eight) hours as needed for anxiety. 08/29/16  Yes Delo, Riley Lam, MD  olmesartan (BENICAR) 5 MG tablet Take 5 mg by mouth daily.     Yes [provider]  rosuvastatin (CRESTOR) 10 MG tablet Take 10 mg by mouth daily.     Yes [provider]  fluconazole (DIFLUCAN) 150 MG tablet Take 1 tablet (150 mg total) by mouth once. As needed for yeast Patient not taking: Reported on 04/20/2015 03/31/14   Fontaine, Nadyne Coombes, MD  meloxicam (MOBIC) 15 MG tablet Take 1 tablet (15 mg total) by mouth daily. Patient not taking: Reported on 08/28/2016 11/16/13   Alvan Dame, DPM  predniSONE (DELTASONE) 10 MG tablet Take 3 tablets (30 mg total) by mouth daily. Patient not taking: Reported on 03/31/2014 09/07/13   Clementeen Graham  S, MD    Family History Family History  Problem Relation Age of Onset  . Hypertension Mother   . Heart disease Mother   . Diabetes Father   . COPD Father   . Heart disease Maternal Grandmother   . Heart disease Maternal Grandfather   . Cancer Maternal Grandfather        LUNG     Social History Social History  Substance Use Topics  . Smoking status: Current Every Day Smoker    Packs/day: 0.30    Types: Cigarettes  . Smokeless tobacco: Never Used  . Alcohol use 0.0 oz/week     Comment: rare     Allergies   Peanut-containing drug products; Pollen extract; and Shellfish allergy   Review of Systems Review of Systems  All other systems reviewed and are negative.    Physical Exam Updated Vital Signs BP 124/87 (BP Location: Left Arm)   Pulse 92   Temp 97.7 F (36.5 C)  (Oral)   Resp 19   Ht 1.524 m (5')   Wt 46.8 kg (103 lb 4 oz)   SpO2 100%   BMI 20.16 kg/m   Physical Exam  Constitutional: She is oriented to person, place, and time. She appears well-developed and well-nourished.  Non-toxic appearance. No distress.  HENT:  Head: Normocephalic and atraumatic.  Eyes: Pupils are equal, round, and reactive to light. Conjunctivae, EOM and lids are normal.  Neck: Normal range of motion. Neck supple. No tracheal deviation present. No thyroid mass present.  Cardiovascular: Normal rate, regular rhythm and normal heart sounds.  Exam reveals no gallop.   No murmur heard. Pulmonary/Chest: Effort normal and breath sounds normal. No stridor. No respiratory distress. She has no decreased breath sounds. She has no wheezes. She has no rhonchi. She has no rales.  Abdominal: Soft. Normal appearance and bowel sounds are normal. She exhibits no distension. There is no tenderness. There is no rebound and no CVA tenderness.  Musculoskeletal: Normal range of motion. She exhibits no edema or tenderness.       Back:  Neurological: She is alert and oriented to person, place, and time. She has normal strength. No cranial nerve deficit or sensory deficit. GCS eye subscore is 4. GCS verbal subscore is 5. GCS motor subscore is 6.  Reflex Scores:      Patellar reflexes are 2+ on the right side and 2+ on the left side. Skin: Skin is warm and dry. No abrasion and no rash noted.  Psychiatric: She has a normal mood and affect. Her speech is normal and behavior is normal.  Nursing note and vitals reviewed.    ED Treatments / Results  Labs (all labs ordered are listed, but only abnormal results are displayed) Labs Reviewed  I-STAT CHEM 8, ED    EKG  EKG Interpretation None       Radiology No results found.  Procedures Procedures (including critical care time)  Medications Ordered in ED Medications  diazepam (VALIUM) tablet 5 mg (not administered)  morphine 2 MG/ML  injection 4 mg (not administered)     Initial Impression / Assessment and Plan / ED Course  I have reviewed the triage vital signs and the nursing notes.  Pertinent labs & imaging results that were available during my care of the patient were reviewed by me and considered in my medical decision making (see chart for details).     Patient medicated for pain and feels better. Does have evidence of dehydration and given IV  fluids. Will follow-up with her doctor.  Final Clinical Impressions(s) / ED Diagnoses   Final diagnoses:  None    New Prescriptions New Prescriptions   No medications on file     Lorre NickAllen, Nolyn Eilert, MD 10/09/16 2214

## 2016-10-07 NOTE — ED Notes (Addendum)
From home with c/o back pain that radiates down her right leg. Pt denies trauma. Pt states pain began Sunday morning. Pt states she has intermittent lightheadedness that has been ongoing for over 1 month. Pt states her PCP told her to come get IV fluids. Pt is able to tolerate PO fluids

## 2016-10-07 NOTE — Discharge Instructions (Signed)
Your creatinine today was elevated to 1.8. This is likely from dehydration but you will need to follow-up with your Dr. This week for repeat renal studies and drink plenty of liquids.

## 2016-10-15 ENCOUNTER — Telehealth: Payer: Self-pay | Admitting: Cardiovascular Disease

## 2016-10-15 NOTE — Telephone Encounter (Signed)
Received records from [facility] for appointment on 11/11/16 with Dr Duke Salvia.  Records put with Dr Leonides Sake schedule for 11/11/16. lp

## 2016-10-22 ENCOUNTER — Ambulatory Visit
Admission: RE | Admit: 2016-10-22 | Discharge: 2016-10-22 | Disposition: A | Discharge status: Home / Self Care | Source: Ambulatory Visit | Attending: Nurse Practitioner | Admitting: Nurse Practitioner

## 2016-10-22 ENCOUNTER — Other Ambulatory Visit: Payer: Self-pay | Admitting: Nurse Practitioner

## 2016-10-22 DIAGNOSIS — M25551 Pain in right hip: Secondary | ICD-10-CM

## 2016-10-29 ENCOUNTER — Other Ambulatory Visit: Payer: Self-pay | Admitting: Nurse Practitioner

## 2016-10-29 DIAGNOSIS — R42 Dizziness and giddiness: Secondary | ICD-10-CM

## 2016-10-31 ENCOUNTER — Ambulatory Visit
Admission: RE | Admit: 2016-10-31 | Discharge: 2016-10-31 | Disposition: A | Discharge status: Home / Self Care | Source: Ambulatory Visit | Attending: Nurse Practitioner | Admitting: Nurse Practitioner

## 2016-10-31 DIAGNOSIS — R42 Dizziness and giddiness: Secondary | ICD-10-CM

## 2016-11-11 ENCOUNTER — Encounter: Payer: Self-pay | Admitting: Cardiovascular Disease

## 2016-11-11 ENCOUNTER — Ambulatory Visit (INDEPENDENT_AMBULATORY_CARE_PROVIDER_SITE_OTHER): Admitting: Cardiovascular Disease

## 2016-11-11 VITALS — BP 144/82 | HR 77 | Ht 60.0 in | Wt 113.0 lb

## 2016-11-11 DIAGNOSIS — R55 Syncope and collapse: Secondary | ICD-10-CM | POA: Insufficient documentation

## 2016-11-11 DIAGNOSIS — R002 Palpitations: Secondary | ICD-10-CM

## 2016-11-11 DIAGNOSIS — I129 Hypertensive chronic kidney disease with stage 1 through stage 4 chronic kidney disease, or unspecified chronic kidney disease: Secondary | ICD-10-CM | POA: Insufficient documentation

## 2016-11-11 DIAGNOSIS — N170 Acute kidney failure with tubular necrosis: Secondary | ICD-10-CM

## 2016-11-11 DIAGNOSIS — I10 Essential (primary) hypertension: Secondary | ICD-10-CM | POA: Diagnosis not present

## 2016-11-11 DIAGNOSIS — R0602 Shortness of breath: Secondary | ICD-10-CM

## 2016-11-11 HISTORY — DX: Syncope and collapse: R55

## 2016-11-11 HISTORY — DX: Acute kidney failure with tubular necrosis: N17.0

## 2016-11-11 HISTORY — DX: Essential (primary) hypertension: I10

## 2016-11-11 NOTE — Patient Instructions (Addendum)
Medication Instructions:  Your physician recommends that you continue on your current medications as directed. Please refer to the Current Medication list given to you today.  Labwork: none  Testing/Procedures: Your physician has recommended that you wear an event monitor. Event monitors are medical devices that record the heart's electrical activity. Doctors most often [place] these monitors to diagnose arrhythmias. Arrhythmias are problems with the speed or rhythm of the heartbeat. The monitor is a small, portable device. You can wear one while you do your normal daily activities. This is usually used to diagnose what is causing palpitations/syncope (passing out). 30 day [facility] AT [address]  Your physician has requested that you have an echocardiogram. Echocardiography is a painless test that uses sound waves to create images of your heart. It provides your doctor with information about the size and shape of your heart and how well your heart's chambers and valves are working. This procedure takes approximately one hour. There are no restrictions for this procedure. [facility] AT [address]  Your physician has requested that you have a lexiscan myoview. For further information please visit [email or web address] Please follow instruction sheet, as given.  Follow-Up: Your physician recommends that you schedule a follow-up appointment in: 2 MONTH OV  If you need a refill on your cardiac medications before your next appointment, please call your pharmacy.  Echocardiogram An echocardiogram, or echocardiography, uses sound waves (ultrasound) to produce an image of your heart. The echocardiogram is simple, painless, obtained within a short period of time, and offers valuable information to your health care provider. The images from an echocardiogram can provide information such as:  Evidence of coronary artery disease (CAD).  Heart size.  Heart muscle  function.  Heart valve function.  Aneurysm detection.  Evidence of a past heart attack.  Fluid buildup around the heart.  Heart muscle thickening.  Assess heart valve function.  Tell a health care provider about:  Any allergies you have.  All medicines you are taking, including vitamins, herbs, eye drops, creams, and over-the-counter medicines.  Any problems you or family members have had with anesthetic medicines.  Any blood disorders you have.  Any surgeries you have had.  Any medical conditions you have.  Whether you are pregnant or may be pregnant. What happens before the procedure? No special preparation is needed. Eat and drink normally. What happens during the procedure?  In order to produce an image of your heart, gel will be applied to your chest and a wand-like tool (transducer) will be moved over your chest. The gel will help transmit the sound waves from the transducer. The sound waves will harmlessly bounce off your heart to allow the heart images to be captured in real-time motion. These images will then be recorded.  You may need an IV to receive a medicine that improves the quality of the pictures. What happens after the procedure? You may return to your normal schedule including diet, activities, and medicines, unless your health care provider tells you otherwise. This information is not intended to replace advice given to you by your health care provider. Make sure you discuss any questions you have with your health care provider. Document Released: 02/08/2000 Document Revised: 09/29/2015 Document Reviewed: 10/18/2012 Elsevier Interactive Patient Education  2017 [employer].

## 2016-11-11 NOTE — Progress Notes (Signed)
Cardiology Office Note   Date:  11/11/2016   ID:  [name], DOB [birth year], MRN [id number]  PCP:  Dorothyann Peng, MD  Cardiologist:   Chilton Si, MD   No chief complaint on file.    History of Present Illness: [name] is a 56 y.o. female with diabetes, hypertension, anxiety, and tobacco abuse who presents for an evaluation of sinus tachycardia.  [name] saw Arnette Felts, [name], on 09/17/16 and was noted to be tachycardic with a heart rate of 102.  At the time she was also noted to have anxiety and was being dreated for alcohol withdrawal.  She had previously been in the hospital where her EtOH level was 238.  She hadn't had any alcohol in the threee weeks prior to her appointment with [name].  At that appointment she had lab work that revealed a potassium of 3.0 and a creatinine of 2.16. Thyroid function was normal.  Her creatinine 08/29/16 was 1.0.  [name] reports recurrent episodes of syncope.  The first was 3 months ago, followed by 2 months ago and 3 weeks ago. The first two episodes happened after walking up steps and a ramp, respectively.  When she got to the top she got short of breath and then collapsed.  She thinks that her heart was racing. There was no associated chest pain or lightheadedness. The episode two months ago occurred soon after getting out of a chair.  She reports intermittent left ankle edema. This is been more persistent for the last few days. Prior to that the last time she had swelling was for 5 months ago. She denies orthopnea or PND. [name] doesn't get much formal exercise. 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 She doesn't typically have exertional symptoms. She does have some chest tightness that occurs randomly but not with exertion.   Ms. Dapolito has been taking olmesartan since at least 2013.  She was previously on meloxicam but this was discontinued.  She continues  to smoke one pack of  cigarettes daily. She is ready to quit but feels as though it's never a good time. She has tried using vapor cigarettes and gum. She is contemplating patches.   Past Medical History:  Diagnosis Date  . Acute kidney failure with lesion of tubular necrosis (HCC) 11/11/2016  . Anxiety   . Back pain   . Diabetes mellitus   . Essential hypertension 11/11/2016  . Fibromyalgia   . High cholesterol   . Hypertension   . Syncope 11/11/2016    Past Surgical History:  Procedure Laterality Date  . HYSTEROSCOPY  11/07   D&C  . OOPHORECTOMY     BSO  . TUBAL LIGATION  1991  . VAGINAL HYSTERECTOMY  01/2008   [facility] BSO     Current Outpatient Prescriptions  Medication Sig Dispense Refill  . albuterol (PROVENTIL HFA;VENTOLIN HFA) 108 (90 Base) MCG/ACT inhaler Inhale 1 puff into the lungs every 6 (six) hours as needed for wheezing or shortness of breath.    Marland Kitchen aspirin 81 MG chewable tablet Chew 81 mg by mouth daily.    . budesonide-formoterol (SYMBICORT) 80-4.5 MCG/ACT inhaler Inhale 2 puffs into the lungs as needed.    . busPIRone (BUSPAR) 5 MG tablet Take 5 mg by mouth 2 (two) times daily.    . cyclobenzaprine (FLEXERIL) 10 MG tablet Take 10 mg by mouth 3 (three) times daily as needed. Takes 1/2 of tablet as  needed for muscular discomfort.    . diclofenac (FLECTOR) 1.3 % PTCH Place 1 patch onto the skin as needed.    . DULoxetine (CYMBALTA) 60 MG capsule Take 60 mg by mouth daily.      . fluconazole (DIFLUCAN) 150 MG tablet Take 150 mg by mouth as needed.    . gabapentin (NEURONTIN) 100 MG capsule Take 100 mg by mouth 3 (three) times daily.    Marland Kitchen LORazepam (ATIVAN) 1 MG tablet Take 1 mg by mouth 2 (two) times daily as needed for anxiety.    . Melatonin 5 MG TABS Take 1 tablet by mouth at bedtime.    . methocarbamol (ROBAXIN-750) 750 MG tablet Take 1 tablet (750 mg total) by mouth 4 (four) times daily. 30 tablet 0  . olmesartan (BENICAR) 5 MG tablet Take 5 mg by mouth daily.      . rosuvastatin  (CRESTOR) 10 MG tablet Take 10 mg by mouth daily.      . temazepam (RESTORIL) 15 MG capsule Take 15 mg by mouth at bedtime as needed for sleep.    . traMADol (ULTRAM) 50 MG tablet Take 50 mg by mouth every 6 (six) hours as needed.     No current facility-administered medications for this visit.     Allergies:   Pollen extract; Shellfish allergy; and Peanut-containing drug products    Social History:  The patient  reports that she has been smoking Cigarettes.  She has been smoking about 0.30 packs per day. She has never used smokeless tobacco. She reports that she drinks alcohol. She reports that she does not use drugs.   Family History:  The patient's family history includes COPD in her father; Cancer in her maternal grandfather; Diabetes in her father; Heart disease in her maternal grandfather; Heart failure in her maternal grandmother and mother; Hypertension in her mother.    ROS:  Please see the history of present illness.   Otherwise, review of systems are positive for none.   All other systems are reviewed and negative.    PHYSICAL EXAM: VS:  BP (!) 144/82   Pulse 77   Ht 5' (1.524 m)   Wt 51.3 kg (113 lb)   BMI 22.07 kg/m  , BMI Body mass index is 22.07 kg/m. GENERAL:  Well appearing HEENT:  Pupils equal round and reactive, fundi not visualized, oral mucosa unremarkable NECK:  No jugular venous distention, waveform within normal limits, carotid upstroke brisk and symmetric, no bruits, no thyromegaly LYMPHATICS:  No cervical adenopathy LUNGS:  Clear to auscultation bilaterally HEART:  RRR.  PMI not displaced or sustained,S1 and S2 within normal limits, no S3, no S4, no clicks, no rubs, no murmurs ABD:  Flat, positive bowel sounds normal in frequency in pitch, no bruits, no rebound, no guarding, no midline pulsatile mass, no hepatomegaly, no splenomegaly EXT:  2 plus pulses throughout, no edema, no cyanosis no clubbing SKIN:  No rashes no nodules NEURO:  Cranial nerves II  through XII grossly intact, motor grossly intact throughout PSYCH:  Cognitively intact, oriented to person place and time   EKG:  EKG is ordered today. The ekg ordered today demonstrates sinus rhythm.  Rate 77 bpm.     Recent Labs: 08/29/2016: ALT 29 10/07/2016: BUN 18; Creatinine, Ser 1.80; Hemoglobin 13.2; Platelets 270; Potassium 3.9; Sodium 136    Lipid Panel    Component Value Date/Time   CHOL 180 02/23/2012 1118   TRIG 223 (H) 02/23/2012 1118   HDL 64 02/23/2012  1118   CHOLHDL 2.8 02/23/2012 1118   VLDL 45 (H) 02/23/2012 1118   LDLCALC 71 02/23/2012 1118   09/17/16: TSH 2.26 Sodium 138, potassium 3.0, BUN 20, creatinine 2.16 WBC 5.3, hemoglobin 13, hematocrit 38.9, platelets 211     Wt Readings from Last 3 Encounters:  11/11/16 51.3 kg (113 lb)  10/07/16 46.8 kg (103 lb 4 oz)  04/20/15 53.1 kg (117 lb)      ASSESSMENT AND PLAN:  # Syncope: Etiology unclear.  The episodes are preceded by shortness of breath and palpitations.  Labs have ben unremarkable other than her potassium of 3.  We will get a 30 day event monitor, echo and Lexiscan Myoview.  Symptoms have been precipitated by exertion.   # Hypertension: BP is poorly-controlled.  She reports this is due to pain.  I have asked her to track her BP and bring back for follow up.  Consider adding amlodipine.  # AKI: Patient has been referred to nephrology by her PCP.  Avoid nephrotoxic agents. OK to continue olemsartan until we hear from them.    Current medicines are reviewed at length with the patient today.  The patient does not have concerns regarding medicines.  The following changes have been made:  No change  Labs/ tests ordered today include:   Orders Placed This Encounter  Procedures  . Myocardial Perfusion Imaging  . Cardiac event monitor  . EKG 12-Lead  . ECHOCARDIOGRAM COMPLETE     Disposition:   FU with Oneta Sigman C. Duke Salvia, MD, [facility] in 2 months.    This note was written with the assistance  of speech recognition software.  Please excuse any transcriptional errors.  Signed, Lijah Harris C. Duke Salvia, MD, [facility]  11/11/2016 4:03 PM    [facility]

## 2016-11-18 ENCOUNTER — Telehealth (HOSPITAL_COMMUNITY): Payer: Self-pay

## 2016-11-18 NOTE — Telephone Encounter (Signed)
Encounter complete. 

## 2016-11-19 ENCOUNTER — Ambulatory Visit (HOSPITAL_COMMUNITY)
Admission: RE | Admit: 2016-11-19 | Discharge: 2016-11-19 | Disposition: A | Discharge status: Home / Self Care | Source: Ambulatory Visit | Attending: Cardiology | Admitting: Cardiology

## 2016-11-19 DIAGNOSIS — R002 Palpitations: Secondary | ICD-10-CM

## 2016-11-19 DIAGNOSIS — R0602 Shortness of breath: Secondary | ICD-10-CM

## 2016-11-19 DIAGNOSIS — R55 Syncope and collapse: Secondary | ICD-10-CM | POA: Diagnosis not present

## 2016-11-19 LAB — MYOCARDIAL PERFUSION IMAGING
CHL CUP NUCLEAR SDS: 0
CHL CUP NUCLEAR SRS: 0
CHL CUP NUCLEAR SSS: 0
CSEPPHR: 110 {beats}/min
LV dias vol: 66 mL (ref 46–106)
LV sys vol: 24 mL
Rest HR: 75 {beats}/min
TID: 1.05

## 2016-11-19 MED ORDER — TECHNETIUM TC 99M TETROFOSMIN IV KIT
29.5000 | PACK | Freq: Once | INTRAVENOUS | Status: AC | PRN
  Administered 2016-11-19: 29.5 via INTRAVENOUS
  Filled 2016-11-19: qty 30

## 2016-11-19 MED ORDER — REGADENOSON 0.4 MG/5ML IV SOLN
0.4000 mg | Freq: Once | INTRAVENOUS | Status: AC
  Administered 2016-11-19: 0.4 mg via INTRAVENOUS

## 2016-11-19 MED ORDER — TECHNETIUM TC 99M TETROFOSMIN IV KIT
9.6000 | PACK | Freq: Once | INTRAVENOUS | Status: AC | PRN
  Administered 2016-11-19: 9.6 via INTRAVENOUS
  Filled 2016-11-19: qty 10

## 2016-11-27 ENCOUNTER — Encounter: Payer: Self-pay | Admitting: *Deleted

## 2016-11-27 ENCOUNTER — Other Ambulatory Visit (HOSPITAL_COMMUNITY)

## 2016-11-27 ENCOUNTER — Encounter

## 2016-11-27 NOTE — Progress Notes (Signed)
Patient ID: [name], female   DOB: Feb 18, 1961, 56 y.o.   MRN: [id number]  Patient did not show up for scheduled Echocardiogram or Cardiac event monitor appointment on 11/27/16.  Patient is scheduled for follow up with Dr. Duke Salvia 01/07/17.

## 2017-01-02 ENCOUNTER — Emergency Department (HOSPITAL_COMMUNITY)
Admission: EM | Admit: 2017-01-02 | Discharge: 2017-01-02 | Disposition: A | Discharge status: Home / Self Care | Attending: Emergency Medicine | Admitting: Emergency Medicine

## 2017-01-02 ENCOUNTER — Other Ambulatory Visit: Payer: Self-pay

## 2017-01-02 ENCOUNTER — Encounter (HOSPITAL_COMMUNITY): Payer: Self-pay | Admitting: Emergency Medicine

## 2017-01-02 DIAGNOSIS — N189 Chronic kidney disease, unspecified: Secondary | ICD-10-CM | POA: Insufficient documentation

## 2017-01-02 DIAGNOSIS — Z7982 Long term (current) use of aspirin: Secondary | ICD-10-CM | POA: Diagnosis not present

## 2017-01-02 DIAGNOSIS — E1122 Type 2 diabetes mellitus with diabetic chronic kidney disease: Secondary | ICD-10-CM | POA: Diagnosis not present

## 2017-01-02 DIAGNOSIS — I129 Hypertensive chronic kidney disease with stage 1 through stage 4 chronic kidney disease, or unspecified chronic kidney disease: Secondary | ICD-10-CM | POA: Insufficient documentation

## 2017-01-02 DIAGNOSIS — Z79899 Other long term (current) drug therapy: Secondary | ICD-10-CM | POA: Insufficient documentation

## 2017-01-02 DIAGNOSIS — M5441 Lumbago with sciatica, right side: Secondary | ICD-10-CM | POA: Insufficient documentation

## 2017-01-02 DIAGNOSIS — F17210 Nicotine dependence, cigarettes, uncomplicated: Secondary | ICD-10-CM | POA: Insufficient documentation

## 2017-01-02 DIAGNOSIS — M79604 Pain in right leg: Secondary | ICD-10-CM | POA: Diagnosis present

## 2017-01-02 MED ORDER — METHOCARBAMOL 500 MG PO TABS: 500.0000 mg | ORAL_TABLET | Freq: Two times a day (BID) | ORAL | 0 refills | Status: DC

## 2017-01-02 MED ORDER — HYDROCODONE-ACETAMINOPHEN 5-325 MG PO TABS: 1.0000 | ORAL_TABLET | Freq: Four times a day (QID) | ORAL | 0 refills | Status: DC | PRN

## 2017-01-02 MED ORDER — GABAPENTIN 300 MG PO CAPS: 300.0000 mg | ORAL_CAPSULE | Freq: Three times a day (TID) | ORAL | 0 refills | Status: DC

## 2017-01-02 NOTE — ED Provider Notes (Signed)
[facility]-EMERGENCY DEPT Provider Note   CSN: [id number] Arrival date & time: 01/02/17  1015     History   Chief Complaint Chief Complaint  Patient presents with  . Leg Pain    HPI [name] is a 56 y.o. female who presents with back pain.  Past medical history significant for chronic back pain, hyperlipidemia, hypertension, AKI, diabetes currently diet controlled, tobacco use.  Patient states that she had a fall in August onto her right side.  She did not have significant pain until this past Monday about 5 days ago.  She states the pain is in the lower back and goes down the right buttock all the way to the right foot.  She reports associated numbness and tingling.  She has been taking gabapentin and tramadol with no relief.  She also notes that she has had some shortness of breath and had a syncopal episode in September.  She has been seen by Dr. Duke Salvia with cardiology in September who ordered a echo, stress test, Holter monitoring but this has not yet been done. She is a current smoker. No fever, recurrent syncope, trauma, unexplained weight loss, hx of cancer, loss of bowel/bladder function, saddle anesthesia, urinary retention, IVDU.  She has seen Dr. Mikal Plane with neurosurgery in the past.  She had an MRI in September 2017 which showed a synovial cyst on the left and degenerative disc disease. She states that she is been putting off her care due to having to care for her disabled husband and a recent death of a friend.    HPI  Past Medical History:  Diagnosis Date  . Acute kidney failure with lesion of tubular necrosis (HCC) 11/11/2016  . Anxiety   . Back pain   . Diabetes mellitus   . Essential hypertension 11/11/2016  . Fibromyalgia   . High cholesterol   . Hypertension   . Syncope 11/11/2016    Patient Active Problem List   Diagnosis Date Noted  . Syncope 11/11/2016  . Essential hypertension 11/11/2016  . Acute kidney failure with lesion of  tubular necrosis (HCC) 11/11/2016  . Shoulder pain 08/26/2011  . Fibromyalgia   . LATERAL EPICONDYLITIS, BILATERAL 05/25/2008  . STRESS FRACTURE, FOOT 05/25/2008    Past Surgical History:  Procedure Laterality Date  . HYSTEROSCOPY  11/07   D&C  . OOPHORECTOMY     BSO  . TUBAL LIGATION  1991  . VAGINAL HYSTERECTOMY  01/2008   [facility] BSO    OB History    Gravida Para Term Preterm AB Living   6 4 4   2 4    SAB TAB Ectopic Multiple Live Births   2               Home Medications    Prior to Admission medications   Medication Sig Start Date End Date Taking? Authorizing Provider  albuterol (PROVENTIL HFA;VENTOLIN HFA) 108 (90 Base) MCG/ACT inhaler Inhale 1 puff into the lungs every 6 (six) hours as needed for wheezing or shortness of breath.    [provider]  aspirin 81 MG chewable tablet Chew 81 mg by mouth daily.    [provider]  budesonide-formoterol (SYMBICORT) 80-4.5 MCG/ACT inhaler Inhale 2 puffs into the lungs as needed.    [provider]  busPIRone (BUSPAR) 5 MG tablet Take 5 mg by mouth 2 (two) times daily.    [provider]  diclofenac (FLECTOR) 1.3 % PTCH Place 1 patch onto the skin as  needed.    [provider]  DULoxetine (CYMBALTA) 60 MG capsule Take 60 mg by mouth daily.      [provider]  fluconazole (DIFLUCAN) 150 MG tablet Take 150 mg by mouth as needed.    [provider]  gabapentin (NEURONTIN) 300 MG capsule Take 1 capsule (300 mg total) 3 (three) times daily by mouth. 01/02/17   Bethel BornGekas, Ora Mcnatt Marie, PA-C  HYDROcodone-acetaminophen (NORCO/VICODIN) 5-325 MG tablet Take 1 tablet every 6 (six) hours as needed by mouth. 01/02/17   Bethel BornGekas, Winn Muehl Marie, PA-C  LORazepam (ATIVAN) 1 MG tablet Take 1 mg by mouth 2 (two) times daily as needed for anxiety.    [provider]  Melatonin 5 MG TABS Take 1 tablet by mouth at bedtime.    [provider]  methocarbamol (ROBAXIN) 500 MG tablet  Take 1 tablet (500 mg total) 2 (two) times daily by mouth. 01/02/17   Bethel BornGekas, Emmalina Espericueta Marie, PA-C  olmesartan (BENICAR) 5 MG tablet Take 5 mg by mouth daily.      [provider]  rosuvastatin (CRESTOR) 10 MG tablet Take 10 mg by mouth daily.      [provider]  temazepam (RESTORIL) 15 MG capsule Take 15 mg by mouth at bedtime as needed for sleep.    [provider]  traMADol (ULTRAM) 50 MG tablet Take 50 mg by mouth every 6 (six) hours as needed.    [provider]    Family History Family History  Problem Relation Age of Onset  . Hypertension Mother   . Heart failure Mother   . Diabetes Father   . COPD Father   . Emphysema Father   . Heart failure Maternal Grandmother   . Cancer Maternal Grandfather        LUNG   . Heart disease Maternal Grandfather   . Lung cancer Maternal Grandfather     Social History Social History   Tobacco Use  . Smoking status: Current Every Day Smoker    Packs/day: 0.30    Types: Cigarettes  . Smokeless tobacco: Never Used  Substance Use Topics  . Alcohol use: Yes    Alcohol/week: 0.0 oz    Comment: rare  . Drug use: No     Allergies   Pollen extract; Shellfish allergy; and Peanut-containing drug products   Review of Systems Review of Systems  Constitutional: Negative for fever.  Respiratory: Positive for shortness of breath.   Cardiovascular: Negative for chest pain.  Musculoskeletal: Positive for back pain, gait problem and myalgias.  Neurological: Positive for numbness. Negative for weakness.     Physical Exam Updated Vital Signs BP (!) 122/106 (BP Location: Left Arm)   Pulse 97   Temp 98.2 F (36.8 C) (Oral)   Resp 18   SpO2 99%   Physical Exam  Constitutional: She is oriented to person, place, and time. She appears well-developed and well-nourished. No distress.  HENT:  Head: Normocephalic and atraumatic.  Eyes: Conjunctivae are normal. Pupils are equal, round, and reactive to light.  Right eye exhibits no discharge. Left eye exhibits no discharge. No scleral icterus.  Neck: Normal range of motion.  Cardiovascular: Normal rate and regular rhythm. Exam reveals no gallop and no friction rub.  No murmur heard. Pulmonary/Chest: Effort normal and breath sounds normal. No stridor. No respiratory distress. She has no wheezes. She has no rales. She exhibits no tenderness.  Abdominal: She exhibits no distension.  Musculoskeletal:  Back: Inspection: No masses, deformity, or rash  Palpation: Lumbar midline spinal tenderness with right sided paraspinal muscle tenderness and gluteal tenderness Strength: 5/5 in lower extremities and normal plantar and dorsiflexion Sensation: Intact sensation with light touch in lower extremities bilaterally Reflexes: Patellar reflex is 2+ bilaterally Gait: Antalgic gait   Neurological: She is alert and oriented to person, place, and time.  Skin: Skin is warm and dry.  Psychiatric: She has a normal mood and affect. Her behavior is normal.  Nursing note and vitals reviewed.    ED Treatments / Results  Labs (all labs ordered are listed, but only abnormal results are displayed) Labs Reviewed - No data to display  EKG  EKG Interpretation None       Radiology No results found.  Procedures Procedures (including critical care time)  Medications Ordered in ED Medications - No data to display   Initial Impression / Assessment and Plan / ED Course  I have reviewed the triage vital signs and the nursing notes.  Pertinent labs & imaging results that were available during my care of the patient were reviewed by me and considered in my medical decision making (see chart for details).  56 year old female with acute on chronic low back pain with right-sided sciatica.  She is mildly hypertensive but otherwise vital signs are normal.  No red flags on exam or in history.  I advised her that we can provide pain control for her today however  ultimately she will need to follow-up with Dr. Mikal Planeabell for ultimate management of her back pain.  I will increase her gabapentin to 300 3 times daily, provide short course of pain medicine and muscle relaxer.  I also advised her to follow-up with her primary doctor regarding her kidney function and smoking cessation.  Patient is in agreement with plan.  Return precautions were given.  Final Clinical Impressions(s) / ED Diagnoses   Final diagnoses:  Acute right-sided low back pain with right-sided sciatica    ED Discharge Orders        Ordered    gabapentin (NEURONTIN) 300 MG capsule  3 times daily     01/02/17 1150    HYDROcodone-acetaminophen (NORCO/VICODIN) 5-325 MG tablet  Every 6 hours PRN     01/02/17 1150    methocarbamol (ROBAXIN) 500 MG tablet  2 times daily     01/02/17 1150       Bethel BornGekas, Johnpatrick Jenny Marie, PA-C 01/02/17 1200    Gwyneth SproutPlunkett, Whitney, MD 01/03/17 [phone]

## 2017-01-02 NOTE — ED Notes (Signed)
Pt is alert and oriented x 4 and is verbally. Pt presents with dtr, and reports rt leg pain x 5 days. Pt reports that she has taken motrin and has not improve symptoms.

## 2017-01-02 NOTE — Discharge Instructions (Signed)
Please take Gabapentin 300mg  three times daily Take Norco as needed for pain. This medicine makes you drowsy so do not take before driving or work Take Robaxin (muscle relaxer) as needed Use a heating pad for sore muscles - use for 20 minutes several times a day Please follow up with Dr. Mikal Planeabell and your primary doctor Return for worsening symptoms

## 2017-01-02 NOTE — ED Notes (Signed)
Bed: WTR7 Expected date:  Expected time:  Means of arrival:  Comments: 

## 2017-01-02 NOTE — ED Triage Notes (Signed)
Pt complaint of right lower back pain radiating down right leg onset a week ago; hx of fall to right side 2 months ago.

## 2017-01-02 NOTE — ED Notes (Signed)
Per Anitra LauthPlunkett, MD pt plan to see SW and receive cab voucher to [facility].

## 2017-01-05 ENCOUNTER — Ambulatory Visit (INDEPENDENT_AMBULATORY_CARE_PROVIDER_SITE_OTHER)

## 2017-01-05 ENCOUNTER — Ambulatory Visit (HOSPITAL_COMMUNITY): Attending: Cardiology

## 2017-01-05 ENCOUNTER — Other Ambulatory Visit: Payer: Self-pay

## 2017-01-05 DIAGNOSIS — I071 Rheumatic tricuspid insufficiency: Secondary | ICD-10-CM | POA: Insufficient documentation

## 2017-01-05 DIAGNOSIS — I5030 Unspecified diastolic (congestive) heart failure: Secondary | ICD-10-CM | POA: Diagnosis not present

## 2017-01-05 DIAGNOSIS — R55 Syncope and collapse: Secondary | ICD-10-CM

## 2017-01-05 DIAGNOSIS — R002 Palpitations: Secondary | ICD-10-CM

## 2017-01-05 DIAGNOSIS — R0602 Shortness of breath: Secondary | ICD-10-CM

## 2017-01-07 ENCOUNTER — Ambulatory Visit: Admitting: Cardiovascular Disease

## 2017-01-21 ENCOUNTER — Telehealth: Payer: Self-pay | Admitting: Cardiovascular Disease

## 2017-01-21 NOTE — Telephone Encounter (Signed)
Please call [employer] directly to request additional patches, [phone].  They will ship directly to the home.  It may take 3 days.  If completely out of patches, we can leave on at the front desk, which, should last 3 days until your shipment arrives.  Please give us a call back if that is necessary.

## 2017-01-21 NOTE — Telephone Encounter (Signed)
New message     Patient is calling for more patches for the event monitor

## 2017-02-10 ENCOUNTER — Ambulatory Visit: Admitting: Cardiovascular Disease

## 2017-07-19 ENCOUNTER — Encounter (HOSPITAL_COMMUNITY): Payer: Self-pay | Admitting: Emergency Medicine

## 2017-07-19 ENCOUNTER — Emergency Department (HOSPITAL_COMMUNITY)

## 2017-07-19 ENCOUNTER — Inpatient Hospital Stay (HOSPITAL_COMMUNITY)
Admission: EM | Admit: 2017-07-19 | Discharge: 2017-07-23 | DRG: 439 | Disposition: A | Discharge status: Home / Self Care | Attending: Family Medicine | Admitting: Family Medicine

## 2017-07-19 DIAGNOSIS — Z833 Family history of diabetes mellitus: Secondary | ICD-10-CM | POA: Diagnosis not present

## 2017-07-19 DIAGNOSIS — F1010 Alcohol abuse, uncomplicated: Secondary | ICD-10-CM | POA: Diagnosis not present

## 2017-07-19 DIAGNOSIS — R945 Abnormal results of liver function studies: Secondary | ICD-10-CM

## 2017-07-19 DIAGNOSIS — Z7982 Long term (current) use of aspirin: Secondary | ICD-10-CM | POA: Diagnosis not present

## 2017-07-19 DIAGNOSIS — F17210 Nicotine dependence, cigarettes, uncomplicated: Secondary | ICD-10-CM | POA: Diagnosis present

## 2017-07-19 DIAGNOSIS — R7989 Other specified abnormal findings of blood chemistry: Secondary | ICD-10-CM

## 2017-07-19 DIAGNOSIS — I110 Hypertensive heart disease with heart failure: Secondary | ICD-10-CM | POA: Diagnosis present

## 2017-07-19 DIAGNOSIS — M797 Fibromyalgia: Secondary | ICD-10-CM | POA: Diagnosis present

## 2017-07-19 DIAGNOSIS — E860 Dehydration: Secondary | ICD-10-CM | POA: Diagnosis present

## 2017-07-19 DIAGNOSIS — R748 Abnormal levels of other serum enzymes: Secondary | ICD-10-CM | POA: Diagnosis present

## 2017-07-19 DIAGNOSIS — I5030 Unspecified diastolic (congestive) heart failure: Secondary | ICD-10-CM | POA: Diagnosis not present

## 2017-07-19 DIAGNOSIS — K852 Alcohol induced acute pancreatitis without necrosis or infection: Principal | ICD-10-CM | POA: Diagnosis present

## 2017-07-19 DIAGNOSIS — E785 Hyperlipidemia, unspecified: Secondary | ICD-10-CM | POA: Diagnosis not present

## 2017-07-19 DIAGNOSIS — K859 Acute pancreatitis without necrosis or infection, unspecified: Secondary | ICD-10-CM | POA: Diagnosis not present

## 2017-07-19 DIAGNOSIS — R0600 Dyspnea, unspecified: Secondary | ICD-10-CM | POA: Diagnosis not present

## 2017-07-19 DIAGNOSIS — R1013 Epigastric pain: Secondary | ICD-10-CM

## 2017-07-19 DIAGNOSIS — Z23 Encounter for immunization: Secondary | ICD-10-CM | POA: Diagnosis not present

## 2017-07-19 DIAGNOSIS — E119 Type 2 diabetes mellitus without complications: Secondary | ICD-10-CM | POA: Diagnosis not present

## 2017-07-19 DIAGNOSIS — N179 Acute kidney failure, unspecified: Secondary | ICD-10-CM | POA: Diagnosis not present

## 2017-07-19 DIAGNOSIS — E7800 Pure hypercholesterolemia, unspecified: Secondary | ICD-10-CM | POA: Diagnosis not present

## 2017-07-19 DIAGNOSIS — I129 Hypertensive chronic kidney disease with stage 1 through stage 4 chronic kidney disease, or unspecified chronic kidney disease: Secondary | ICD-10-CM | POA: Diagnosis present

## 2017-07-19 DIAGNOSIS — Z90710 Acquired absence of both cervix and uterus: Secondary | ICD-10-CM | POA: Diagnosis not present

## 2017-07-19 DIAGNOSIS — I10 Essential (primary) hypertension: Secondary | ICD-10-CM | POA: Diagnosis present

## 2017-07-19 DIAGNOSIS — K700 Alcoholic fatty liver: Secondary | ICD-10-CM | POA: Diagnosis not present

## 2017-07-19 DIAGNOSIS — Z8249 Family history of ischemic heart disease and other diseases of the circulatory system: Secondary | ICD-10-CM | POA: Diagnosis not present

## 2017-07-19 HISTORY — DX: Dehydration: E86.0

## 2017-07-19 HISTORY — DX: Acute pancreatitis without necrosis or infection, unspecified: K85.90

## 2017-07-19 LAB — I-STAT CG4 LACTIC ACID, ED
LACTIC ACID, VENOUS: 2.3 mmol/L — AB (ref 0.5–1.9)
LACTIC ACID, VENOUS: 0.85 mmol/L (ref 0.5–1.9)

## 2017-07-19 LAB — CBC WITH DIFFERENTIAL/PLATELET
BASOS ABS: 0 10*3/uL (ref 0.0–0.1)
Basophils Relative: 1 %
EOS ABS: 0 10*3/uL (ref 0.0–0.7)
EOS PCT: 0 %
HCT: 40.9 % (ref 36.0–46.0)
Hemoglobin: 14.2 g/dL (ref 12.0–15.0)
Lymphocytes Relative: 15 %
Lymphs Abs: 1 10*3/uL (ref 0.7–4.0)
MCH: 32.6 pg (ref 26.0–34.0)
MCHC: 34.7 g/dL (ref 30.0–36.0)
MCV: 94 fL (ref 78.0–100.0)
Monocytes Absolute: 0.3 10*3/uL (ref 0.1–1.0)
Monocytes Relative: 5 %
Neutro Abs: 5.1 10*3/uL (ref 1.7–7.7)
Neutrophils Relative %: 79 %
PLATELETS: 194 10*3/uL (ref 150–400)
RBC: 4.35 MIL/uL (ref 3.87–5.11)
RDW: 13 % (ref 11.5–15.5)
WBC: 6.5 10*3/uL (ref 4.0–10.5)

## 2017-07-19 LAB — URINALYSIS, ROUTINE W REFLEX MICROSCOPIC
Bilirubin Urine: NEGATIVE
GLUCOSE, UA: NEGATIVE mg/dL
Hgb urine dipstick: NEGATIVE
KETONES UR: NEGATIVE mg/dL
LEUKOCYTES UA: NEGATIVE
NITRITE: NEGATIVE
PROTEIN: NEGATIVE mg/dL
Specific Gravity, Urine: 1.026 (ref 1.005–1.030)
pH: 5 (ref 5.0–8.0)

## 2017-07-19 LAB — COMPREHENSIVE METABOLIC PANEL
ALBUMIN: 4.3 g/dL (ref 3.5–5.0)
ALT: 67 U/L — ABNORMAL HIGH (ref 14–54)
ANION GAP: 14 (ref 5–15)
AST: 105 U/L — AB (ref 15–41)
Alkaline Phosphatase: 62 U/L (ref 38–126)
BUN: 39 mg/dL — ABNORMAL HIGH (ref 6–20)
CALCIUM: 10.1 mg/dL (ref 8.9–10.3)
CO2: 23 mmol/L (ref 22–32)
Chloride: 95 mmol/L — ABNORMAL LOW (ref 101–111)
Creatinine, Ser: 1.69 mg/dL — ABNORMAL HIGH (ref 0.44–1.00)
GFR calc Af Amer: 38 mL/min — ABNORMAL LOW (ref >60)
GFR calc non Af Amer: 33 mL/min — ABNORMAL LOW (ref >60)
Glucose, Bld: 164 mg/dL — ABNORMAL HIGH (ref 65–99)
POTASSIUM: 3.5 mmol/L (ref 3.5–5.1)
Sodium: 132 mmol/L — ABNORMAL LOW (ref 135–145)
TOTAL PROTEIN: 8.4 g/dL — AB (ref 6.5–8.1)
Total Bilirubin: 1 mg/dL (ref 0.3–1.2)

## 2017-07-19 LAB — MAGNESIUM: Magnesium: 1.6 mg/dL — ABNORMAL LOW (ref 1.7–2.4)

## 2017-07-19 LAB — PHOSPHORUS: PHOSPHORUS: 3.1 mg/dL (ref 2.5–4.6)

## 2017-07-19 LAB — I-STAT TROPONIN, ED: TROPONIN I, POC: 0 ng/mL (ref 0.00–0.08)

## 2017-07-19 LAB — LIPASE, BLOOD: LIPASE: 1575 U/L — AB (ref 11–51)

## 2017-07-19 LAB — GLUCOSE, CAPILLARY: GLUCOSE-CAPILLARY: 175 mg/dL — AB (ref 65–99)

## 2017-07-19 MED ORDER — BUSPIRONE HCL 10 MG PO TABS
5.0000 mg | ORAL_TABLET | Freq: Two times a day (BID) | ORAL | Status: DC
  Administered 2017-07-20 – 2017-07-23 (×7): 5 mg via ORAL
  Filled 2017-07-19 (×8): qty 1

## 2017-07-19 MED ORDER — ONDANSETRON HCL 4 MG/2ML IJ SOLN
4.0000 mg | Freq: Four times a day (QID) | INTRAMUSCULAR | Status: DC | PRN
  Administered 2017-07-23: 4 mg via INTRAVENOUS
  Filled 2017-07-19: qty 2

## 2017-07-19 MED ORDER — ROSUVASTATIN CALCIUM 10 MG PO TABS
10.0000 mg | ORAL_TABLET | Freq: Every day | ORAL | Status: DC
  Administered 2017-07-20 – 2017-07-23 (×4): 10 mg via ORAL
  Filled 2017-07-19 (×6): qty 1

## 2017-07-19 MED ORDER — DULOXETINE HCL 30 MG PO CPEP
60.0000 mg | ORAL_CAPSULE | Freq: Every day | ORAL | Status: DC
  Administered 2017-07-20 – 2017-07-23 (×4): 60 mg via ORAL
  Filled 2017-07-19 (×6): qty 2

## 2017-07-19 MED ORDER — SODIUM CHLORIDE 0.9 % IV SOLN
INTRAVENOUS | Status: AC
  Administered 2017-07-20: via INTRAVENOUS

## 2017-07-19 MED ORDER — ENOXAPARIN SODIUM 30 MG/0.3ML ~~LOC~~ SOLN
30.0000 mg | Freq: Every day | SUBCUTANEOUS | Status: DC
  Administered 2017-07-20: 30 mg via SUBCUTANEOUS
  Filled 2017-07-19 (×2): qty 0.3

## 2017-07-19 MED ORDER — MORPHINE SULFATE (PF) 4 MG/ML IV SOLN
4.0000 mg | Freq: Once | INTRAVENOUS | Status: AC
  Administered 2017-07-19: 4 mg via INTRAVENOUS
  Filled 2017-07-19: qty 1

## 2017-07-19 MED ORDER — MAGNESIUM SULFATE 50 % IJ SOLN: 1.0000 g | Freq: Once | INTRAMUSCULAR | Status: DC

## 2017-07-19 MED ORDER — SODIUM CHLORIDE 0.9 % IV BOLUS
1000.0000 mL | Freq: Once | INTRAVENOUS | Status: AC
  Administered 2017-07-19: 1000 mL via INTRAVENOUS

## 2017-07-19 MED ORDER — METHOCARBAMOL 500 MG PO TABS
500.0000 mg | ORAL_TABLET | Freq: Two times a day (BID) | ORAL | Status: DC
  Administered 2017-07-20 – 2017-07-23 (×8): 500 mg via ORAL
  Filled 2017-07-19 (×9): qty 1

## 2017-07-19 MED ORDER — ACETAMINOPHEN 650 MG RE SUPP: 650.0000 mg | Freq: Four times a day (QID) | RECTAL | Status: DC | PRN

## 2017-07-19 MED ORDER — ASPIRIN 81 MG PO CHEW
81.0000 mg | CHEWABLE_TABLET | Freq: Every day | ORAL | Status: DC
  Administered 2017-07-20 – 2017-07-23 (×4): 81 mg via ORAL
  Filled 2017-07-19 (×5): qty 1

## 2017-07-19 MED ORDER — MAGNESIUM SULFATE IN D5W 1-5 GM/100ML-% IV SOLN
1.0000 g | Freq: Once | INTRAVENOUS | Status: AC
  Administered 2017-07-19: 1 g via INTRAVENOUS
  Filled 2017-07-19: qty 100

## 2017-07-19 MED ORDER — ONDANSETRON HCL 4 MG/2ML IJ SOLN
4.0000 mg | Freq: Once | INTRAMUSCULAR | Status: AC
  Administered 2017-07-19: 4 mg via INTRAVENOUS
  Filled 2017-07-19: qty 2

## 2017-07-19 MED ORDER — FENTANYL CITRATE (PF) 100 MCG/2ML IJ SOLN
50.0000 ug | Freq: Once | INTRAMUSCULAR | Status: AC
  Administered 2017-07-19: 50 ug via INTRAVENOUS
  Filled 2017-07-19: qty 2

## 2017-07-19 MED ORDER — IOPAMIDOL (ISOVUE-300) INJECTION 61%
100.0000 mL | Freq: Once | INTRAVENOUS | Status: AC | PRN
  Administered 2017-07-19: 70 mL via INTRAVENOUS

## 2017-07-19 MED ORDER — INSULIN ASPART 100 UNIT/ML ~~LOC~~ SOLN
0.0000 [IU] | SUBCUTANEOUS | Status: DC
  Administered 2017-07-20: 2 [IU] via SUBCUTANEOUS
  Administered 2017-07-20: 1 [IU] via SUBCUTANEOUS
  Administered 2017-07-21 (×2): 2 [IU] via SUBCUTANEOUS
  Administered 2017-07-21: 1 [IU] via SUBCUTANEOUS

## 2017-07-19 MED ORDER — IPRATROPIUM BROMIDE 0.02 % IN SOLN
0.5000 mg | Freq: Four times a day (QID) | RESPIRATORY_TRACT | Status: DC
  Administered 2017-07-20: 0.5 mg via RESPIRATORY_TRACT
  Filled 2017-07-19: qty 2.5

## 2017-07-19 MED ORDER — ACETAMINOPHEN 325 MG PO TABS
650.0000 mg | ORAL_TABLET | Freq: Four times a day (QID) | ORAL | Status: DC | PRN
  Administered 2017-07-21 – 2017-07-23 (×6): 650 mg via ORAL
  Filled 2017-07-19 (×6): qty 2

## 2017-07-19 MED ORDER — ONDANSETRON HCL 4 MG PO TABS: 4.0000 mg | ORAL_TABLET | Freq: Four times a day (QID) | ORAL | Status: DC | PRN

## 2017-07-19 MED ORDER — ALBUTEROL SULFATE (2.5 MG/3ML) 0.083% IN NEBU: 2.5000 mg | INHALATION_SOLUTION | RESPIRATORY_TRACT | Status: DC | PRN

## 2017-07-19 MED ORDER — IOPAMIDOL (ISOVUE-300) INJECTION 61%
INTRAVENOUS | Status: AC
  Filled 2017-07-19: qty 100

## 2017-07-19 MED ORDER — MOMETASONE FURO-FORMOTEROL FUM 100-5 MCG/ACT IN AERO
2.0000 | INHALATION_SPRAY | Freq: Two times a day (BID) | RESPIRATORY_TRACT | Status: DC
  Administered 2017-07-20 – 2017-07-23 (×7): 2 via RESPIRATORY_TRACT
  Filled 2017-07-19 (×2): qty 8.8

## 2017-07-19 MED ORDER — GUAIFENESIN ER 600 MG PO TB12
600.0000 mg | ORAL_TABLET | Freq: Two times a day (BID) | ORAL | Status: DC
  Administered 2017-07-20 – 2017-07-23 (×8): 600 mg via ORAL
  Filled 2017-07-19 (×9): qty 1

## 2017-07-19 MED ORDER — GABAPENTIN 300 MG PO CAPS
300.0000 mg | ORAL_CAPSULE | Freq: Three times a day (TID) | ORAL | Status: DC
  Administered 2017-07-20 – 2017-07-23 (×11): 300 mg via ORAL
  Filled 2017-07-19 (×12): qty 1

## 2017-07-19 NOTE — ED Notes (Signed)
Nurse requesting a few more minutes before report

## 2017-07-19 NOTE — ED Notes (Signed)
Pt aware urine sample needed. Collection cup at bedside.

## 2017-07-19 NOTE — ED Provider Notes (Signed)
Emergency Department Provider Note   I have reviewed the triage vital signs and the nursing notes.   HISTORY  Chief Complaint Shortness of Breath; Cough; and back pain   HPI [name] is a 57 y.o. female with PMH of fibromyalgia, HLD, HTN, and DM resents to the emergency department for evaluation of left lower quadrant abdominal pain.  The patient states that this discomfort has worsened over the past 2 days.  She denies any nausea, vomiting, she has not experienced any fevers.  No radiation of symptoms.  Pain worse with touching the area.  Patient reports associated dyspnea on exertion, back pain, right foot pain.  The symptoms have been ongoing for months or not acutely worsening today.  Patient denies any associated chest pain.  Patient notices a mass on the bottom of the right foot with some numbness in the toes which is also been going on for months.  In terms of the patient's back discomfort this is located in the thoracic spine area and has been present for months.  No weakness or numbness.  No acutely worsening pain symptoms.  Past Medical History:  Diagnosis Date  . Acute kidney failure with lesion of tubular necrosis (HCC) 11/11/2016  . Anxiety   . Back pain   . Diabetes mellitus   . Essential hypertension 11/11/2016  . Fibromyalgia   . High cholesterol   . Hypertension   . Syncope 11/11/2016    Patient Active Problem List   Diagnosis Date Noted  . Dyspnea 07/20/2017  . Hypomagnesemia 07/20/2017  . Elevated lipase 07/20/2017  . Elevated LFTs 07/20/2017  . Alcohol abuse 07/20/2017  . Dehydration 07/19/2017  . Pancreatitis 07/19/2017  . Syncope 11/11/2016  . Essential hypertension 11/11/2016  . Acute kidney failure with lesion of tubular necrosis (HCC) 11/11/2016  . Shoulder pain 08/26/2011  . Fibromyalgia   . LATERAL EPICONDYLITIS, BILATERAL 05/25/2008  . STRESS FRACTURE, FOOT 05/25/2008    Past Surgical History:  Procedure Laterality Date  .  HYSTEROSCOPY  11/07   D&C  . OOPHORECTOMY     BSO  . TUBAL LIGATION  1991  . VAGINAL HYSTERECTOMY  01/2008   [facility] BSO      Allergies Tylenol [acetaminophen]; Pollen extract; Shellfish allergy; and Peanut-containing drug products  Family History  Problem Relation Age of Onset  . Hypertension Mother   . Heart failure Mother   . Diabetes Father   . COPD Father   . Emphysema Father   . Heart failure Maternal Grandmother   . Cancer Maternal Grandfather        LUNG   . Heart disease Maternal Grandfather   . Lung cancer Maternal Grandfather     Social History Social History   Tobacco Use  . Smoking status: Current Every Day Smoker    Packs/day: 0.30    Types: Cigarettes  . Smokeless tobacco: Never Used  Substance Use Topics  . Alcohol use: Yes    Alcohol/week: 0.0 oz    Comment: rare  . Drug use: No    Review of Systems  Constitutional: No fever/chills Eyes: No visual changes. ENT: No sore throat. Cardiovascular: Denies chest pain. Respiratory: Positive chronic shortness of breath. Gastrointestinal: Positive acute LLQ abdominal pain.  No nausea, no vomiting.  No diarrhea.  No constipation. Genitourinary: Negative for dysuria. Musculoskeletal: Positive for chronic back pain. Positive right foot mass.  Skin: Negative for rash. Neurological: Negative for headaches, focal weakness or numbness.  10-point ROS otherwise negative.  ____________________________________________  PHYSICAL EXAM:  VITAL SIGNS: ED Triage Vitals  Enc Vitals Group     BP 07/19/17 1439 108/84     Pulse Rate 07/19/17 1439 (!) 125     Resp 07/19/17 1439 19     Temp 07/19/17 1439 98.1 F (36.7 C)     Temp Source 07/19/17 1439 Oral     SpO2 07/19/17 1439 95 %     Weight 07/19/17 1438 110 lb (49.9 kg)     Height 07/19/17 1438 5' (1.524 m)     Pain Score 07/19/17 1438 9    Constitutional: Alert and oriented. Well appearing and in no acute distress. Eyes: Conjunctivae are normal.    Head: Atraumatic. Nose: No congestion/rhinnorhea. Mouth/Throat: Mucous membranes are moist.  Neck: No stridor.   Cardiovascular: Tachycardia. Good peripheral circulation. Grossly normal heart sounds.   Respiratory: Normal respiratory effort.  No retractions. Lungs CTAB. Gastrointestinal: Soft with focal LLQ abdominal pain.  No rebound or guarding. No distention.  Musculoskeletal: No lower extremity tenderness nor edema. No gross deformities of extremities. Neuroma appreciated on the plantar surface of the right foot.  Neurologic:  Normal speech and language. No gross focal neurologic deficits are appreciated.  Skin:  Skin is warm, dry and intact. No rash noted.  ____________________________________________   LABS (all labs ordered are listed, but only abnormal results are displayed)  Labs Reviewed  COMPREHENSIVE METABOLIC PANEL - Abnormal; Notable for the following components:      Result Value   Sodium 132 (*)    Chloride 95 (*)    Glucose, Bld 164 (*)    BUN 39 (*)    Creatinine, Ser 1.69 (*)    Total Protein 8.4 (*)    AST 105 (*)    ALT 67 (*)    GFR calc non Af Amer 33 (*)    GFR calc Af Amer 38 (*)    All other components within normal limits  LIPASE, BLOOD - Abnormal; Notable for the following components:   Lipase 1,575 (*)    All other components within normal limits  URINALYSIS, ROUTINE W REFLEX MICROSCOPIC - Abnormal; Notable for the following components:   APPearance HAZY (*)    All other components within normal limits  LIPASE, BLOOD - Abnormal; Notable for the following components:   Lipase 1,010 (*)    All other components within normal limits  MAGNESIUM - Abnormal; Notable for the following components:   Magnesium 1.6 (*)    All other components within normal limits  PHOSPHORUS - Abnormal; Notable for the following components:   Phosphorus 2.2 (*)    All other components within normal limits  COMPREHENSIVE METABOLIC PANEL - Abnormal; Notable for the  following components:   Sodium 132 (*)    Potassium 3.3 (*)    Chloride 100 (*)    BUN 34 (*)    Creatinine, Ser 1.22 (*)    Calcium 8.6 (*)    Total Protein 6.4 (*)    Albumin 3.3 (*)    AST 61 (*)    GFR calc non Af Amer 48 (*)    GFR calc Af Amer 56 (*)    All other components within normal limits  CBC - Abnormal; Notable for the following components:   RBC 3.31 (*)    Hemoglobin 10.5 (*)    HCT 31.0 (*)    All other components within normal limits  D-DIMER, QUANTITATIVE (NOT AT [facility]) - Abnormal; Notable for the following components:   D-Dimer, Quant  5.47 (*)    All other components within normal limits  GLUCOSE, CAPILLARY - Abnormal; Notable for the following components:   Glucose-Capillary 175 (*)    All other components within normal limits  I-STAT CG4 LACTIC ACID, ED - Abnormal; Notable for the following components:   Lactic Acid, Venous 2.30 (*)    All other components within normal limits  CBC WITH DIFFERENTIAL/PLATELET  PHOSPHORUS  MAGNESIUM  TSH  HEMOGLOBIN A1C  TROPONIN I  TROPONIN I  GLUCOSE, CAPILLARY  GLUCOSE, CAPILLARY  RAPID URINE DRUG SCREEN, HOSP PERFORMED  SODIUM, URINE, RANDOM  CREATININE, URINE, RANDOM  OSMOLALITY, URINE  HIV ANTIBODY (ROUTINE TESTING)  TROPONIN I  I-STAT TROPONIN, ED  I-STAT CG4 LACTIC ACID, ED   ____________________________________________  EKG   EKG Interpretation  Date/Time:  Sunday Jul 19 2017 14:43:44 EDT Ventricular Rate:  139 PR Interval:    QRS Duration: 83 QT Interval:  298 QTC Calculation: 454 R Axis:   49 Text Interpretation:  Sinus tachycardia Atrial premature complexes Consider right atrial enlargement Low voltage, extremity and precordial leads No STEMI.  Confirmed by Alona Bene [phone]) on 07/19/2017 3:13:10 PM       ____________________________________________  RADIOLOGY  Dg Chest 2 View  Result Date: 07/19/2017 CLINICAL DATA:  Shortness of breath and cough EXAM: CHEST - 2 VIEW COMPARISON:   April 03, 2016 FINDINGS: There is no edema or consolidation. The heart size and pulmonary vascularity are normal. No adenopathy. There is aortic atherosclerosis. No evident bone lesions. IMPRESSION: Aortic atherosclerosis.  No evident edema or consolidation. Aortic Atherosclerosis (ICD10-I70.0). Electronically Signed   By: Bretta Bang III M.D.   On: 07/19/2017 15:18   Ct Abdomen Pelvis W Contrast  Result Date: 07/19/2017 CLINICAL DATA:  Abdominal pain.  Diverticulitis suspected. EXAM: CT ABDOMEN AND PELVIS WITH CONTRAST TECHNIQUE: Multidetector CT imaging of the abdomen and pelvis was performed using the standard protocol following bolus administration of intravenous contrast. CONTRAST:  70mL ISOVUE-300 IOPAMIDOL (ISOVUE-300) INJECTION 61% COMPARISON:  None. FINDINGS: Lower chest: Clear lung bases.  Heart normal in size. Hepatobiliary: Liver is borderline enlarged with diffuse decreased attenuation consistent fatty infiltration. No liver mass or focal lesion. Normal gallbladder. No bile duct dilation. Pancreas: Unremarkable. No pancreatic ductal dilatation or surrounding inflammatory changes. Spleen: Normal in size without focal abnormality. Adrenals/Urinary Tract: Adrenal glands are unremarkable. Kidneys are normal, without renal calculi, focal lesion, or hydronephrosis. Bladder is unremarkable. Stomach/Bowel: There are numerous colonic diverticula, more prominent on the left. No evidence of diverticulitis. No colonic wall thickening or inflammation. Stomach and small bowel are unremarkable. Normal appendix visualized. Vascular/Lymphatic: Aortic atherosclerosis. No enlarged abdominal or pelvic lymph nodes. Reproductive: Status post hysterectomy. No adnexal masses. Other: No abdominal wall hernia or abnormality. No abdominopelvic ascites. Musculoskeletal: No fracture or acute finding. No osteoblastic or osteolytic lesions. IMPRESSION: 1. No acute findings. 2. Numerous colonic diverticula without evidence  of diverticulitis. 3. Hepatic steatosis. 4. Aortic atherosclerosis. Electronically Signed   By: Amie Portland M.D.   On: 07/19/2017 17:40   US Abdomen Limited Ruq  Result Date: 07/19/2017 CLINICAL DATA:  Elevated liver enzymes EXAM: ULTRASOUND ABDOMEN LIMITED RIGHT UPPER QUADRANT COMPARISON:  CT abdomen and pelvis Jul 19, 2017 FINDINGS: Gallbladder: No gallstones or wall thickening visualized. There is no pericholecystic fluid. No sonographic Murphy sign noted by sonographer. Common bile duct: Diameter: 4 mm. There is no intrahepatic or extrahepatic biliary duct dilatation. Liver: No focal lesion identified. Liver echogenicity is increased diffusely. Portal vein is patent on color  Doppler imaging with normal direction of blood flow towards the liver. IMPRESSION: Diffuse increase in liver echogenicity, a finding felt to be indicative of hepatic steatosis. While no focal liver lesions are evident on this study, it must be cautioned that the sensitivity of ultrasound for detection of focal liver lesions is diminished in this circumstance. Study otherwise unremarkable. Electronically Signed   By: Bretta Bang III M.D.   On: 07/19/2017 21:56    ____________________________________________   PROCEDURES  Procedure(s) performed:   Procedures  None ____________________________________________   INITIAL IMPRESSION / ASSESSMENT AND PLAN / ED COURSE  Pertinent labs & imaging results that were available during my care of the patient were reviewed by me and considered in my medical decision making (see chart for details).  Patient presents to the emergency department for evaluation of left lower quadrant abdominal pain.  His focal tenderness on exam as well as sinus tachycardia on monitor.  No hypoxemia.  Patient denies any chest pain.  The shortness of breath she describes is chronic, intermittent, not acutely worsening.  My suspicion for pulmonary embolism is extremely low.  Plan for CT imaging of  the abdomen and pelvis along with IV fluids pain/nausea medications.  Very low suspicion for atypical ACS.  Patient's right foot has a small neuroma appreciated.  Plan for referral to podiatry as an outpatient.  Labs consistent with acute pancreatitis. CT imaging reviewed with no acute findings. Remaining labs reviewed. Patient will require admission for pain control and further pancreatitis evaluation.   Discussed patient's case with Hospitpalist, Dr. Adela Glimpse to request admission. Patient and family (if present) updated with plan. Care transferred to Hospitalist service.  I reviewed all nursing notes, vitals, pertinent old records, EKGs, labs, imaging (as available).  ____________________________________________  FINAL CLINICAL IMPRESSION(S) / ED DIAGNOSES  Final diagnoses:  Epigastric pain  Acute pancreatitis without infection or necrosis, unspecified pancreatitis type     MEDICATIONS GIVEN DURING THIS VISIT:  Medications  insulin aspart (novoLOG) injection 0-9 Units (0 Units Subcutaneous Not Given 07/20/17 0801)  aspirin chewable tablet 81 mg (has no administration in time range)  mometasone-formoterol (DULERA) 100-5 MCG/ACT inhaler 2 puff (2 puffs Inhalation Not Given 07/19/17 2309)  busPIRone (BUSPAR) tablet 5 mg (has no administration in time range)  DULoxetine (CYMBALTA) DR capsule 60 mg (has no administration in time range)  gabapentin (NEURONTIN) capsule 300 mg (300 mg Oral Given 07/20/17 0024)  methocarbamol (ROBAXIN) tablet 500 mg (500 mg Oral Given 07/20/17 0024)  rosuvastatin (CRESTOR) tablet 10 mg (has no administration in time range)  acetaminophen (TYLENOL) tablet 650 mg (has no administration in time range)    Or  acetaminophen (TYLENOL) suppository 650 mg (has no administration in time range)  ondansetron (ZOFRAN) tablet 4 mg (has no administration in time range)    Or  ondansetron (ZOFRAN) injection 4 mg (has no administration in time range)  enoxaparin (LOVENOX)  injection 30 mg (30 mg Subcutaneous Given 07/20/17 0024)  0.9 %  sodium chloride infusion ( Intravenous Transfusing/Transfer 07/20/17 0826)  albuterol (PROVENTIL) (2.5 MG/3ML) 0.083% nebulizer solution 2.5 mg (has no administration in time range)  guaiFENesin (MUCINEX) 12 hr tablet 600 mg (600 mg Oral Given 07/20/17 0024)  pneumococcal 23 valent vaccine (PNU-IMMUNE) injection 0.5 mL (has no administration in time range)  feeding supplement (ENSURE ENLIVE) (ENSURE ENLIVE) liquid 237 mL (has no administration in time range)  ipratropium (ATROVENT) nebulizer solution 0.5 mg (0.5 mg Nebulization Given 07/20/17 0834)  alum & mag hydroxide-simeth (MAALOX/MYLANTA) 200-200-20 MG/5ML  suspension 15 mL (has no administration in time range)  sodium chloride 0.9 % bolus 1,000 mL (0 mLs Intravenous Stopped 07/19/17 1751)  ondansetron (ZOFRAN) injection 4 mg (4 mg Intravenous Given 07/19/17 1551)  fentaNYL (SUBLIMAZE) injection 50 mcg (50 mcg Intravenous Given 07/19/17 1551)  iopamidol (ISOVUE-300) 61 % injection 100 mL (70 mLs Intravenous Contrast Given 07/19/17 1713)  morphine 4 MG/ML injection 4 mg (4 mg Intravenous Given 07/19/17 1828)  magnesium sulfate IVPB 1 g 100 mL (0 g Intravenous Stopped 07/19/17 2355)    Note:  This document was prepared using Dragon voice recognition software and may include unintentional dictation errors.  Alona Bene, MD Emergency Medicine    Consuela Widener, Arlyss Repress, MD 07/20/17 [phone]

## 2017-07-19 NOTE — ED Triage Notes (Signed)
Pt reports SOB since Friday. Reports had cough that is productive and back pain over year. Reports that had cyst on her back but hasnt been able to follow up due to having to take care of her husband.

## 2017-07-19 NOTE — H&P (Addendum)
[name] EUM:[id number] DOB: 09/18/1960 DOA: 07/19/2017     PCP: Glendale Chard, MD   Outpatient Specialists:   CARDS:  Dr. Oval Linsey Neurosurgery Dr. Christella Noa  Patient arrived to ER on 07/19/17 at 1415  Patient coming from:  home Lives  With family   Chief Complaint:  Chief Complaint  Patient presents with  . Shortness of Breath  . Cough  . back pain    HPI: [name] is a 57 y.o. female with medical history significant of  chronic back pain, hyperlipidemia, hypertension, AKI, diabetes currently diet controlled, tobacco use. syncope, diastolic CHF    Presented with   shortness of breath worsens 3 days ago. Increased dyspnea with exertion like sweeping the floor and trying to smoke a cigarette at the same time makes her more short of breath. Reports she almost fell last week because she got up too fast. Had a small twitch of chest pain lasting few seconds but nothing persistent.    Left lower quadrant abdominal pain in left lower quadrant radiating to the back . Worse when she coughs or sits up or tries to vomit. No pain with urination. worse for the past 2 days but no associated nausea or vomiting no fever chronic back pain reports she is unable to follow-up with her primary care doctor secondary to being taking care of her husband. Also endorses right foot pain is been going on for the past few months no chest pain currently.  Reports  drinking alcohol 1.5 beers a day. Used to drink heavy 15 years ago but not anymore.  Denies any new medications. She did have a party this weekend and have had at least 4 beers but maybe more.   Regarding pertinent Chronic problems: History of syncope in September 2018 had extensive work-up including echogram stress test Holter monitor. Echogram showed preserved EF grade 1 diastolic dysfunction   While in ER:   Following Medications were ordered in ER: Medications  iopamidol (ISOVUE-300) 61 % injection (has no administration in  time range)  sodium chloride 0.9 % bolus 1,000 mL (0 mLs Intravenous Stopped 07/19/17 1751)  ondansetron (ZOFRAN) injection 4 mg (4 mg Intravenous Given 07/19/17 1551)  fentaNYL (SUBLIMAZE) injection 50 mcg (50 mcg Intravenous Given 07/19/17 1551)  iopamidol (ISOVUE-300) 61 % injection 100 mL (70 mLs Intravenous Contrast Given 07/19/17 1713)  morphine 4 MG/ML injection 4 mg (4 mg Intravenous Given 07/19/17 1828)    Significant initial  Findings: Abnormal Labs Reviewed  COMPREHENSIVE METABOLIC PANEL - Abnormal; Notable for the following components:      Result Value   Sodium 132 (*)    Chloride 95 (*)    Glucose, Bld 164 (*)    BUN 39 (*)    Creatinine, Ser 1.69 (*)    Total Protein 8.4 (*)    AST 105 (*)    ALT 67 (*)    GFR calc non Af Amer 33 (*)    GFR calc Af Amer 38 (*)    All other components within normal limits  LIPASE, BLOOD - Abnormal; Notable for the following components:   Lipase 1,575 (*)    All other components within normal limits  URINALYSIS, ROUTINE W REFLEX MICROSCOPIC - Abnormal; Notable for the following components:   APPearance HAZY (*)    All other components within normal limits  I-STAT CG4 LACTIC ACID, ED - Abnormal; Notable for the following components:   Lactic Acid, Venous 2.30 (*)    All other  components within normal limits     Na 132 K 3.5 BUN up to 39 Elevated LFT AST 105 ALT67  Cr stable,  Lab Results  Component Value Date   CREATININE 1.69 (H) 07/19/2017   CREATININE 1.80 (H) 10/07/2016   CREATININE 1.01 (H) 08/29/2016      WBC  6.5  HG/HCT   Stable,     Component Value Date/Time   HGB 14.2 07/19/2017 1554   HCT 40.9 07/19/2017 1554     Troponin (Point of Care Test) Recent Labs    07/19/17 1552  TROPIPOC 0.00      Lactic Acid, Venous    Component Value Date/Time   LATICACIDVEN 2.30 (HH) 07/19/2017 1554      UA  no evidence of UTI     CXR - NON acute  CTabd/pelvis -  Non-acute no evidence of pancreatitis numerous  colonic diverticula but no diverticulitis hepatic steatosis noted  ECG:  Personally reviewed by me showing: HR : 139 Rhythm: sinus tachycardia  nonspecific changes right atrial enlargement QTC 454     ED Triage Vitals  Enc Vitals Group     BP 07/19/17 1439 108/84     Pulse Rate 07/19/17 1439 (!) 125     Resp 07/19/17 1439 19     Temp 07/19/17 1439 98.1 F (36.7 C)     Temp Source 07/19/17 1439 Oral     SpO2 07/19/17 1439 95 %     Weight 07/19/17 1438 110 lb (49.9 kg)     Height 07/19/17 1438 5' (1.524 m)     Head Circumference --      Peak Flow --      Pain Score 07/19/17 1438 9     Pain Loc --      Pain Edu? --      Excl. in [place]? --   TMAX(24)@       Latest  Blood pressure 113/84, pulse 91, temperature 98.4 F (36.9 C), temperature source Oral, resp. rate (!) 22, height 5' (1.524 m), weight 49.9 kg (110 lb), SpO2 99 %.      Hospitalist was called for admission for possible pancreatitis and dehydration   Review of Systems:    Pertinent positives include: shortness of breath at rest  Constitutional:  No weight loss, night sweats, Fevers, chills, fatigue, weight loss  HEENT:  No headaches, Difficulty swallowing,Tooth/dental problems,Sore throat,  No sneezing, itching, ear ache, nasal congestion, post nasal drip,  Cardio-vascular:  No chest pain, Orthopnea, PND, anasarca, dizziness, palpitations.no Bilateral lower extremity swelling  GI:  No heartburn, indigestion, abdominal pain, nausea, vomiting, diarrhea, change in bowel habits, loss of appetite, melena, blood in stool, hematemesis Resp:  no . No dyspnea on exertion, No excess mucus, no productive cough, No non-productive cough, No coughing up of blood.No change in color of mucus.No wheezing. Skin:  no rash or lesions. No jaundice GU:  no dysuria, change in color of urine, no urgency or frequency. No straining to urinate.  No flank pain.  Musculoskeletal:  No joint pain or no joint swelling. No decreased  range of motion. No back pain.  Psych:  No change in mood or affect. No depression or anxiety. No memory loss.  Neuro: no localizing neurological complaints, no tingling, no weakness, no double vision, no gait abnormality, no slurred speech, no confusion  As per HPI otherwise 10 point review of systems negative.   Past Medical History:   Past Medical History:  Diagnosis Date  . Acute kidney  failure with lesion of tubular necrosis ([place]) 11/11/2016  . Anxiety   . Back pain   . Diabetes mellitus   . Essential hypertension 11/11/2016  . Fibromyalgia   . High cholesterol   . Hypertension   . Syncope 11/11/2016      Past Surgical History:  Procedure Laterality Date  . HYSTEROSCOPY  11/07   D&C  . OOPHORECTOMY     BSO  . TUBAL LIGATION  1991  . VAGINAL HYSTERECTOMY  01/2008   [facility] BSO    Social History:  Ambulatory independently      reports that she has been smoking cigarettes.  She has been smoking about 0.30 packs per day. She has never used smokeless tobacco. She reports that she drinks alcohol. She reports that she does not use drugs.     Family History:  Family History  Problem Relation Age of Onset  . Hypertension Mother   . Heart failure Mother   . Diabetes Father   . COPD Father   . Emphysema Father   . Heart failure Maternal Grandmother   . Cancer Maternal Grandfather        LUNG   . Heart disease Maternal Grandfather   . Lung cancer Maternal Grandfather     Allergies: Allergies  Allergen Reactions  . Tylenol [Acetaminophen] Itching  . Pollen Extract   . Shellfish Allergy Hives  . Peanut-Containing Drug Products Hives     Prior to Admission medications   Medication Sig Start Date End Date Taking? Authorizing Provider  albuterol (PROVENTIL HFA;VENTOLIN HFA) 108 (90 Base) MCG/ACT inhaler Inhale 1 puff into the lungs every 6 (six) hours as needed for wheezing or shortness of breath.   Yes [provider]  aspirin 81 MG chewable tablet Chew  81 mg by mouth daily.   Yes [provider]  Bimatoprost (LUMIGAN OP) Place 1 drop into both eyes daily as needed (dry eye).   Yes [provider]  budesonide-formoterol (SYMBICORT) 80-4.5 MCG/ACT inhaler Inhale 2 puffs into the lungs as needed.   Yes [provider]  busPIRone (BUSPAR) 5 MG tablet Take 5 mg by mouth 2 (two) times daily.   Yes [provider]  DULoxetine (CYMBALTA) 60 MG capsule Take 60 mg by mouth daily.     Yes [provider]  gabapentin (NEURONTIN) 300 MG capsule Take 1 capsule (300 mg total) 3 (three) times daily by mouth. 01/02/17  Yes Recardo Evangelist, PA-C  Melatonin 5 MG TABS Take 1 tablet by mouth at bedtime.   Yes [provider]  methocarbamol (ROBAXIN) 500 MG tablet Take 1 tablet (500 mg total) 2 (two) times daily by mouth. 01/02/17  Yes Recardo Evangelist, PA-C  olmesartan (BENICAR) 5 MG tablet Take 5 mg by mouth daily.     Yes [provider]  rosuvastatin (CRESTOR) 10 MG tablet Take 10 mg by mouth daily.     Yes [provider]  Tetrahydrozoline HCl (VISINE OP) Place 1 drop into both eyes daily.   Yes [provider]  HYDROcodone-acetaminophen (NORCO/VICODIN) 5-325 MG tablet Take 1 tablet every 6 (six) hours as needed by mouth. Patient not taking: Reported on 07/19/2017 01/02/17   Recardo Evangelist, PA-C  pioglitazone-metformin (ACTOPLUS MET) 15-500 MG per tablet Take 1 tablet by mouth daily.  09/07/13  [provider]  zolpidem (AMBIEN) 10 MG tablet Take 10 mg by mouth at bedtime as needed. Takes 1/2 tab for sleep as needed  09/07/13  [provider]   Physical Exam: Blood pressure 113/84, pulse 91, temperature 98.4 F (36.9 C), temperature source Oral, resp. rate (!) 22, height 5' (1.524 m), weight 49.9 kg (110 lb), SpO2 99 %. 1. General:  in No Acute distress  Chronically ill -appearing 2. Psychological: Alert and  Oriented 3. Head/ENT:   Dry Mucous Membranes                           Head Non traumatic, neck supple                          NormalDentition 4. SKIN:decreased Skin turgor,  Skin clean Dry and intact no rash 5. Heart: Regular rate and rhythm no Murmur, no Rub or gallop 6. Lungs:  Clear to auscultation bilaterally, no wheezes or crackles   7. Abdomen: Soft,  Tender left lower quadrant worse with sit up. Appears musculoskeletal. , Non distended    bowel sounds present 8. Lower extremities: no clubbing, cyanosis, or edema 9. Neurologically Grossly intact, moving all 4 extremities equally 10. MSK: Normal range of motion   LABS:     Recent Labs  Lab 07/19/17 1554  WBC 6.5  NEUTROABS 5.1  HGB 14.2  HCT 40.9  MCV 94.0  PLT 921   Basic Metabolic Panel: Recent Labs  Lab 07/19/17 1554  NA 132*  K 3.5  CL 95*  CO2 23  GLUCOSE 164*  BUN 39*  CREATININE 1.69*  CALCIUM 10.1      Recent Labs  Lab 07/19/17 1554  AST 105*  ALT 67*  ALKPHOS 62  BILITOT 1.0  PROT 8.4*  ALBUMIN 4.3   Recent Labs  Lab 07/19/17 1554  LIPASE 1,575*   No results for input(s): AMMONIA in the last 168 hours.    HbA1C: No results for input(s): HGBA1C in the last 72 hours. CBG: No results for input(s): GLUCAP in the last 168 hours.    Urine analysis:    Component Value Date/Time   COLORURINE YELLOW 07/19/2017 1530   APPEARANCEUR HAZY (A) 07/19/2017 1530   LABSPEC 1.026 07/19/2017 1530   PHURINE 5.0 07/19/2017 1530   GLUCOSEU NEGATIVE 07/19/2017 1530   HGBUR NEGATIVE 07/19/2017 [place] 07/19/2017 1530   KETONESUR NEGATIVE 07/19/2017 1530   PROTEINUR NEGATIVE 07/19/2017 1530   UROBILINOGEN 0.2 03/31/2014 1148   NITRITE NEGATIVE 07/19/2017 1530   LEUKOCYTESUR NEGATIVE 07/19/2017 1530       Cultures: No results found for: SDES, [place], CULT, REPTSTATUS   Radiological Exams on Admission: Dg Chest 2 View  Result Date: 07/19/2017 CLINICAL DATA:  Shortness of breath and cough EXAM: CHEST - 2 VIEW  COMPARISON:  April 03, 2016 FINDINGS: There is no edema or consolidation. The heart size and pulmonary vascularity are normal. No adenopathy. There is aortic atherosclerosis. No evident bone lesions. IMPRESSION: Aortic atherosclerosis.  No evident edema or consolidation. Aortic Atherosclerosis (ICD10-I70.0). Electronically Signed   By: Lowella Grip III M.D.   On: 07/19/2017 15:18   Ct Abdomen Pelvis W Contrast  Result Date: 07/19/2017 CLINICAL DATA:  Abdominal pain.  Diverticulitis suspected. EXAM: CT ABDOMEN AND PELVIS WITH CONTRAST TECHNIQUE: Multidetector CT imaging of the abdomen and pelvis was performed using the standard protocol following bolus administration of intravenous contrast. CONTRAST:  44m ISOVUE-300 IOPAMIDOL (ISOVUE-300) INJECTION 61% COMPARISON:  None. FINDINGS: Lower chest: Clear lung bases.  Heart normal in size. Hepatobiliary: Liver is borderline enlarged with  diffuse decreased attenuation consistent fatty infiltration. No liver mass or focal lesion. Normal gallbladder. No bile duct dilation. Pancreas: Unremarkable. No pancreatic ductal dilatation or surrounding inflammatory changes. Spleen: Normal in size without focal abnormality. Adrenals/Urinary Tract: Adrenal glands are unremarkable. Kidneys are normal, without renal calculi, focal lesion, or hydronephrosis. Bladder is unremarkable. Stomach/Bowel: There are numerous colonic diverticula, more prominent on the left. No evidence of diverticulitis. No colonic wall thickening or inflammation. Stomach and small bowel are unremarkable. Normal appendix visualized. Vascular/Lymphatic: Aortic atherosclerosis. No enlarged abdominal or pelvic lymph nodes. Reproductive: Status post hysterectomy. No adnexal masses. Other: No abdominal wall hernia or abnormality. No abdominopelvic ascites. Musculoskeletal: No fracture or acute finding. No osteoblastic or osteolytic lesions. IMPRESSION: 1. No acute findings. 2. Numerous colonic diverticula  without evidence of diverticulitis. 3. Hepatic steatosis. 4. Aortic atherosclerosis. Electronically Signed   By: Lajean Manes M.D.   On: 07/19/2017 17:40   US Abdomen Limited Ruq  Result Date: 07/19/2017 CLINICAL DATA:  Elevated liver enzymes EXAM: ULTRASOUND ABDOMEN LIMITED RIGHT UPPER QUADRANT COMPARISON:  CT abdomen and pelvis Jul 19, 2017 FINDINGS: Gallbladder: No gallstones or wall thickening visualized. There is no pericholecystic fluid. No sonographic Murphy sign noted by sonographer. Common bile duct: Diameter: 4 mm. There is no intrahepatic or extrahepatic biliary duct dilatation. Liver: No focal lesion identified. Liver echogenicity is increased diffusely. Portal vein is patent on color Doppler imaging with normal direction of blood flow towards the liver. IMPRESSION: Diffuse increase in liver echogenicity, a finding felt to be indicative of hepatic steatosis. While no focal liver lesions are evident on this study, it must be cautioned that the sensitivity of ultrasound for detection of focal liver lesions is diminished in this circumstance. Study otherwise unremarkable. Electronically Signed   By: Lowella Grip III M.D.   On: 07/19/2017 21:56    Chart has been reviewed    Assessment/Plan   57 y.o. female with medical history significant of  chronic back pain, hyperlipidemia, hypertension, AKI, diabetes currently diet controlled, tobacco use. syncope, diastolic CHF    Admitted for possible pancreatitis vs dehydration and dyspnea  Present on Admission:  . Pancreatitis no evidence of pancreatis on CT likely elevated lipase in the setting of dehydration will rehydrate and follow.  . Dehydration - will rehydrate . Essential hypertension - continue home medications Dyspnea - could be pulmonary  Vs cardiac in nature, will obtain echo given severely elevated d.dimer and recent contrasted study will hod of on CTA and order VQ instead. Cycle CE monitor on tele. Given extensive tobacco  abuse early CPD can be also a possibility will need outpatient PFT's and tobacco cessation. elevAted LFT - in the setting of EtOH abuse, check hepatitis serologies, spoke about importance of EtOH  csessation Hx of alcohol abuse patient states she drinks less and denies recent withdrawal symptoms will moniotr for any sign of withdrawal hypomagnesemia will replace  Abdominal pain - likely muscular skeletal, no t typical of pancreatitis.   Other plan as per orders.  DVT prophylaxis:   Lovenox     Code Status:  FULL CODE  per patient   I had personally discussed CODE STATUS with patient and family   Family Communication:   Family  at  Bedside  plan of care was discussed with   Daughter  Disposition Plan:      To home once workup is complete and patient is stable  Consults called: none  Admission status:   obser     Level of care         medical floor        Toy Baker 07/19/2017, 7:45 PM    [facility]  Pager [phone]   after 2 AM please page floor coverage PA If 7AM-7PM, please contact the day team taking care of the patient  [email or web address]  Password [email or web address]

## 2017-07-19 NOTE — ED Notes (Signed)
Bed: WU98 Expected date:  Expected time:  Means of arrival:  Comments: No bed/no monitor

## 2017-07-20 ENCOUNTER — Observation Stay (HOSPITAL_BASED_OUTPATIENT_CLINIC_OR_DEPARTMENT_OTHER)

## 2017-07-20 ENCOUNTER — Other Ambulatory Visit: Payer: Self-pay

## 2017-07-20 DIAGNOSIS — R0600 Dyspnea, unspecified: Secondary | ICD-10-CM | POA: Diagnosis not present

## 2017-07-20 DIAGNOSIS — R748 Abnormal levels of other serum enzymes: Secondary | ICD-10-CM | POA: Diagnosis present

## 2017-07-20 DIAGNOSIS — R7989 Other specified abnormal findings of blood chemistry: Secondary | ICD-10-CM | POA: Diagnosis present

## 2017-07-20 DIAGNOSIS — R945 Abnormal results of liver function studies: Secondary | ICD-10-CM

## 2017-07-20 DIAGNOSIS — F1010 Alcohol abuse, uncomplicated: Secondary | ICD-10-CM | POA: Diagnosis present

## 2017-07-20 DIAGNOSIS — K859 Acute pancreatitis without necrosis or infection, unspecified: Secondary | ICD-10-CM | POA: Diagnosis not present

## 2017-07-20 HISTORY — DX: Dyspnea, unspecified: R06.00

## 2017-07-20 HISTORY — DX: Hypomagnesemia: E83.42

## 2017-07-20 LAB — GLUCOSE, CAPILLARY
GLUCOSE-CAPILLARY: 95 mg/dL (ref 65–99)
GLUCOSE-CAPILLARY: 82 mg/dL (ref 65–99)
GLUCOSE-CAPILLARY: 131 mg/dL — AB (ref 65–99)
Glucose-Capillary: 84 mg/dL (ref 65–99)
Glucose-Capillary: 89 mg/dL (ref 65–99)

## 2017-07-20 LAB — COMPREHENSIVE METABOLIC PANEL
ALK PHOS: 44 U/L (ref 38–126)
ALT: 45 U/L (ref 14–54)
AST: 61 U/L — ABNORMAL HIGH (ref 15–41)
Albumin: 3.3 g/dL — ABNORMAL LOW (ref 3.5–5.0)
Anion gap: 9 (ref 5–15)
BUN: 34 mg/dL — ABNORMAL HIGH (ref 6–20)
CALCIUM: 8.6 mg/dL — AB (ref 8.9–10.3)
CO2: 23 mmol/L (ref 22–32)
CREATININE: 1.22 mg/dL — AB (ref 0.44–1.00)
Chloride: 100 mmol/L — ABNORMAL LOW (ref 101–111)
GFR calc Af Amer: 56 mL/min — ABNORMAL LOW (ref >60)
GFR, EST NON AFRICAN AMERICAN: 48 mL/min — AB (ref >60)
Glucose, Bld: 96 mg/dL (ref 65–99)
Potassium: 3.3 mmol/L — ABNORMAL LOW (ref 3.5–5.1)
Sodium: 132 mmol/L — ABNORMAL LOW (ref 135–145)
Total Bilirubin: 0.9 mg/dL (ref 0.3–1.2)
Total Protein: 6.4 g/dL — ABNORMAL LOW (ref 6.5–8.1)

## 2017-07-20 LAB — HEMOGLOBIN A1C
HEMOGLOBIN A1C: 5.1 % (ref 4.8–5.6)
Mean Plasma Glucose: 99.67 mg/dL

## 2017-07-20 LAB — SODIUM, URINE, RANDOM: Sodium, Ur: 45 mmol/L

## 2017-07-20 LAB — RAPID URINE DRUG SCREEN, HOSP PERFORMED
AMPHETAMINES: NOT DETECTED
BENZODIAZEPINES: NOT DETECTED
Barbiturates: NOT DETECTED
Cocaine: NOT DETECTED
OPIATES: NOT DETECTED
TETRAHYDROCANNABINOL: NOT DETECTED

## 2017-07-20 LAB — CBC
HCT: 31 % — ABNORMAL LOW (ref 36.0–46.0)
Hemoglobin: 10.5 g/dL — ABNORMAL LOW (ref 12.0–15.0)
MCH: 31.7 pg (ref 26.0–34.0)
MCHC: 33.9 g/dL (ref 30.0–36.0)
MCV: 93.7 fL (ref 78.0–100.0)
Platelets: 161 K/uL (ref 150–400)
RBC: 3.31 MIL/uL — ABNORMAL LOW (ref 3.87–5.11)
RDW: 13 % (ref 11.5–15.5)
WBC: 5.1 K/uL (ref 4.0–10.5)

## 2017-07-20 LAB — MAGNESIUM: Magnesium: 1.8 mg/dL (ref 1.7–2.4)

## 2017-07-20 LAB — TROPONIN I
Troponin I: <0.03 ng/mL (ref <0.03)
Troponin I: <0.03 ng/mL (ref <0.03)
Troponin I: <0.03 ng/mL (ref <0.03)

## 2017-07-20 LAB — TSH: TSH: 2.473 u[IU]/mL (ref 0.350–4.500)

## 2017-07-20 LAB — CREATININE, URINE, RANDOM: Creatinine, Urine: 131.15 mg/dL

## 2017-07-20 LAB — LIPASE, BLOOD: Lipase: 1010 U/L — ABNORMAL HIGH (ref 11–51)

## 2017-07-20 LAB — D-DIMER, QUANTITATIVE: D-Dimer, Quant: 5.47 ug{FEU}/mL — ABNORMAL HIGH (ref 0.00–0.50)

## 2017-07-20 LAB — OSMOLALITY, URINE: Osmolality, Ur: 385 mOsm/kg (ref 300–900)

## 2017-07-20 LAB — ECHOCARDIOGRAM COMPLETE
HEIGHTINCHES: 60 in
Weight: 1760 oz

## 2017-07-20 LAB — PHOSPHORUS: Phosphorus: 2.2 mg/dL — ABNORMAL LOW (ref 2.5–4.6)

## 2017-07-20 MED ORDER — ALUM & MAG HYDROXIDE-SIMETH 200-200-20 MG/5ML PO SUSP
15.0000 mL | ORAL | Status: DC | PRN
  Administered 2017-07-22: 15 mL via ORAL
  Filled 2017-07-20: qty 30

## 2017-07-20 MED ORDER — ENSURE ENLIVE PO LIQD: 237.0000 mL | Freq: Two times a day (BID) | ORAL | Status: DC

## 2017-07-20 MED ORDER — PNEUMOCOCCAL VAC POLYVALENT 25 MCG/0.5ML IJ INJ
0.5000 mL | INJECTION | INTRAMUSCULAR | Status: AC
  Administered 2017-07-21: 0.5 mL via INTRAMUSCULAR
  Filled 2017-07-20: qty 0.5

## 2017-07-20 MED ORDER — SODIUM CHLORIDE 0.9 % IV BOLUS
1000.0000 mL | Freq: Once | INTRAVENOUS | Status: AC
  Administered 2017-07-20: 1000 mL via INTRAVENOUS

## 2017-07-20 MED ORDER — SODIUM CHLORIDE 0.9 % IV SOLN
INTRAVENOUS | Status: DC
  Administered 2017-07-20 – 2017-07-22 (×5): via INTRAVENOUS

## 2017-07-20 MED ORDER — ENOXAPARIN SODIUM 40 MG/0.4ML ~~LOC~~ SOLN
40.0000 mg | Freq: Every day | SUBCUTANEOUS | Status: DC
  Administered 2017-07-20 – 2017-07-22 (×3): 40 mg via SUBCUTANEOUS
  Filled 2017-07-20 (×3): qty 0.4

## 2017-07-20 MED ORDER — IPRATROPIUM BROMIDE 0.02 % IN SOLN
0.5000 mg | Freq: Two times a day (BID) | RESPIRATORY_TRACT | Status: DC
  Administered 2017-07-20 – 2017-07-23 (×7): 0.5 mg via RESPIRATORY_TRACT
  Filled 2017-07-20 (×7): qty 2.5

## 2017-07-20 NOTE — Progress Notes (Signed)
Critical lab value: Hgb= 10.5, Plt= 161. Notified MD Patel at [phone] on 07/20/17 Action: waiting for order.

## 2017-07-20 NOTE — Progress Notes (Signed)
[facility] Progress Note  Patient: [name] XBJ:[id number]   PCP: Dorothyann Peng, MD DOB: 1960/03/07   DOA: 07/19/2017   DOS: 07/20/2017   Date of Service: the patient was seen and examined on 07/20/2017  Subjective: Continues to have abdominal pain.  No nausea, my evaluation but did have vomiting last night after eating dinner.  Not passing gas, no BM.  Brief hospital course: Pt. with PMH of chronic back pain, HLD, HTN, type 2 diabetes mellitus-diet-controlled, active smoker, alcohol use; admitted on 07/19/2017, presented with complaint of abdominal pain, nausea and vomiting and shortness of breath, was found to have acute pancreatitis from alcohol. Currently further plan is continue current management.  Assessment and Plan: 1.  Acute alcoholic pancreatitis. Although patient reports that she is only drinking ?1.5 day her AST ALT ratio suggests that she has alcohol induced liver damage. CT scan is not showing any pancreatitis or other acute abnormality but lipase is 1500 on admission.  1000. AST is 30 also trending down. Continues to have abdominal pain although getting better. Continue conservative management, n.p.o. except ice chips, IV fluids, IV anti-emetics, IV Pepcid.  2.  Shortness of breath. Reported history of shortness of breath although patient does not have any chest pain or shortness of my evaluation. D-dimer was elevated and patient was supposed to go for a VQ scan although patient is on room air, does not have any evidence of DVT in the lower extremity does not have any risk factor for do not think the patient requires. Elevated d-dimer can be explained by her having pink otitis. No further work-up at present and monitor. Echocardiogram shows preserved EF no acute abnormality.  3.  Alcohol abuse. Monitor for withdrawal at present. She mentions that she does not drink but a frequent basis last drink was 2 weeks ago.  4.  Active smoker. Cessation  suggested. Monitor  Diet: N.p.o. DVT Prophylaxis: subcutaneous Heparin  Advance goals of care discussion: full code  Family Communication: no family was present at bedside, at the time of interview.   Disposition:  Discharge to home.  Consultants: none Procedures: none  Antibiotics: Anti-infectives (From admission, onward)   None       Objective: Physical Exam: Vitals:   07/20/17 0834 07/20/17 1416 07/20/17 1558 07/20/17 1559  BP:  (!) 88/65 112/81 99/74  Pulse:  70    Resp:      Temp:  98.1 F (36.7 C)    TempSrc:  Oral    SpO2: 95% 100%    Weight:      Height:        Intake/Output Summary (Last 24 hours) at 07/20/2017 1641 Last data filed at 07/20/2017 1551 Gross per 24 hour  Intake 2384.59 ml  Output -  Net 2384.59 ml   Filed Weights   07/19/17 1438  Weight: 49.9 kg (110 lb)   General: Alert, Awake and Oriented to Time, Place and Person. Appear in moderate distress, affect appropriate Eyes: PERRL, Conjunctiva normal ENT: Oral Mucosa clear moist. Neck: no JVD, no Abnormal Mass Or lumps Cardiovascular: S1 and S2 Present, no Murmur, Peripheral Pulses Present Respiratory: normal respiratory effort, Bilateral Air entry equal and Decreased, no use of accessory muscle, Clear to Auscultation, no Crackles, no wheezes Abdomen: Bowel Sound present, Soft and no tenderness, no hernia Skin: no redness, no Rash, no induration Extremities: no Pedal edema, no calf tenderness Neurologic: Grossly no focal neuro deficit. Bilaterally Equal motor strength  Data Reviewed: CBC: Recent Labs  Lab  07/19/17 1554 07/20/17 0529  WBC 6.5 5.1  NEUTROABS 5.1  --   HGB 14.2 10.5*  HCT 40.9 31.0*  MCV 94.0 93.7  PLT 194 161   Basic Metabolic Panel: Recent Labs  Lab 07/19/17 1554 07/20/17 0529  NA 132* 132*  K 3.5 3.3*  CL 95* 100*  CO2 23 23  GLUCOSE 164* 96  BUN 39* 34*  CREATININE 1.69* 1.22*  CALCIUM 10.1 8.6*  MG 1.6* 1.8  PHOS 3.1 2.2*    Liver Function  Tests: Recent Labs  Lab 07/19/17 1554 07/20/17 0529  AST 105* 61*  ALT 67* 45  ALKPHOS 62 44  BILITOT 1.0 0.9  PROT 8.4* 6.4*  ALBUMIN 4.3 3.3*   Recent Labs  Lab 07/19/17 1554 07/20/17 0529  LIPASE 1,575* 1,010*   No results for input(s): AMMONIA in the last 168 hours. Coagulation Profile: No results for input(s): INR, PROTIME in the last 168 hours. Cardiac Enzymes: Recent Labs  Lab 07/20/17 0028 07/20/17 0529 07/20/17 1126  TROPONINI <0.03 <0.03 <0.03   BNP (last 3 results) No results for input(s): PROBNP in the last 8760 hours. CBG: Recent Labs  Lab 07/19/17 2348 07/20/17 0422 07/20/17 0733 07/20/17 1121 07/20/17 1605  GLUCAP 175* 84 89 95 82   Studies: Ct Abdomen Pelvis W Contrast  Result Date: 07/19/2017 CLINICAL DATA:  Abdominal pain.  Diverticulitis suspected. EXAM: CT ABDOMEN AND PELVIS WITH CONTRAST TECHNIQUE: Multidetector CT imaging of the abdomen and pelvis was performed using the standard protocol following bolus administration of intravenous contrast. CONTRAST:  70mL ISOVUE-300 IOPAMIDOL (ISOVUE-300) INJECTION 61% COMPARISON:  None. FINDINGS: Lower chest: Clear lung bases.  Heart normal in size. Hepatobiliary: Liver is borderline enlarged with diffuse decreased attenuation consistent fatty infiltration. No liver mass or focal lesion. Normal gallbladder. No bile duct dilation. Pancreas: Unremarkable. No pancreatic ductal dilatation or surrounding inflammatory changes. Spleen: Normal in size without focal abnormality. Adrenals/Urinary Tract: Adrenal glands are unremarkable. Kidneys are normal, without renal calculi, focal lesion, or hydronephrosis. Bladder is unremarkable. Stomach/Bowel: There are numerous colonic diverticula, more prominent on the left. No evidence of diverticulitis. No colonic wall thickening or inflammation. Stomach and small bowel are unremarkable. Normal appendix visualized. Vascular/Lymphatic: Aortic atherosclerosis. No enlarged abdominal  or pelvic lymph nodes. Reproductive: Status post hysterectomy. No adnexal masses. Other: No abdominal wall hernia or abnormality. No abdominopelvic ascites. Musculoskeletal: No fracture or acute finding. No osteoblastic or osteolytic lesions. IMPRESSION: 1. No acute findings. 2. Numerous colonic diverticula without evidence of diverticulitis. 3. Hepatic steatosis. 4. Aortic atherosclerosis. Electronically Signed   By: Amie Portland M.D.   On: 07/19/2017 17:40   US Abdomen Limited Ruq  Result Date: 07/19/2017 CLINICAL DATA:  Elevated liver enzymes EXAM: ULTRASOUND ABDOMEN LIMITED RIGHT UPPER QUADRANT COMPARISON:  CT abdomen and pelvis Jul 19, 2017 FINDINGS: Gallbladder: No gallstones or wall thickening visualized. There is no pericholecystic fluid. No sonographic Murphy sign noted by sonographer. Common bile duct: Diameter: 4 mm. There is no intrahepatic or extrahepatic biliary duct dilatation. Liver: No focal lesion identified. Liver echogenicity is increased diffusely. Portal vein is patent on color Doppler imaging with normal direction of blood flow towards the liver. IMPRESSION: Diffuse increase in liver echogenicity, a finding felt to be indicative of hepatic steatosis. While no focal liver lesions are evident on this study, it must be cautioned that the sensitivity of ultrasound for detection of focal liver lesions is diminished in this circumstance. Study otherwise unremarkable. Electronically Signed   By: Bretta Bang III M.D.  On: 07/19/2017 21:56    Scheduled Meds: . aspirin  81 mg Oral Daily  . busPIRone  5 mg Oral BID  . DULoxetine  60 mg Oral Daily  . enoxaparin (LOVENOX) injection  40 mg Subcutaneous QHS  . feeding supplement (ENSURE ENLIVE)  237 mL Oral BID BM  . gabapentin  300 mg Oral TID  . guaiFENesin  600 mg Oral BID  . insulin aspart  0-9 Units Subcutaneous Q4H  . ipratropium  0.5 mg Nebulization BID  . methocarbamol  500 mg Oral BID  . mometasone-formoterol  2 puff  Inhalation BID  . [START ON 07/21/2017] pneumococcal 23 valent vaccine  0.5 mL Intramuscular Tomorrow-1000  . rosuvastatin  10 mg Oral Daily   Continuous Infusions: . sodium chloride 125 mL/hr at 07/20/17 1254  . sodium chloride 1,000 mL (07/20/17 1551)   PRN Meds: acetaminophen **OR** acetaminophen, albuterol, alum & mag hydroxide-simeth, ondansetron **OR** ondansetron (ZOFRAN) IV  Time spent: 35 minutes  Author: Lynden Oxford, MD [facility] Pager: [phone] 07/20/2017 4:41 PM  If 7PM-7AM, please contact night-coverage at [email or web address], password [facility]

## 2017-07-20 NOTE — Progress Notes (Signed)
  Echocardiogram 2D Echocardiogram has been performed.  Janalyn Harder 07/20/2017, 3:42 PM

## 2017-07-21 DIAGNOSIS — I110 Hypertensive heart disease with heart failure: Secondary | ICD-10-CM | POA: Diagnosis present

## 2017-07-21 DIAGNOSIS — R945 Abnormal results of liver function studies: Secondary | ICD-10-CM | POA: Diagnosis not present

## 2017-07-21 DIAGNOSIS — E785 Hyperlipidemia, unspecified: Secondary | ICD-10-CM | POA: Diagnosis present

## 2017-07-21 DIAGNOSIS — K852 Alcohol induced acute pancreatitis without necrosis or infection: Secondary | ICD-10-CM | POA: Diagnosis not present

## 2017-07-21 DIAGNOSIS — E7800 Pure hypercholesterolemia, unspecified: Secondary | ICD-10-CM | POA: Diagnosis present

## 2017-07-21 DIAGNOSIS — Z833 Family history of diabetes mellitus: Secondary | ICD-10-CM | POA: Diagnosis not present

## 2017-07-21 DIAGNOSIS — R748 Abnormal levels of other serum enzymes: Secondary | ICD-10-CM | POA: Diagnosis not present

## 2017-07-21 DIAGNOSIS — F1010 Alcohol abuse, uncomplicated: Secondary | ICD-10-CM | POA: Diagnosis not present

## 2017-07-21 DIAGNOSIS — Z90710 Acquired absence of both cervix and uterus: Secondary | ICD-10-CM | POA: Diagnosis not present

## 2017-07-21 DIAGNOSIS — M797 Fibromyalgia: Secondary | ICD-10-CM | POA: Diagnosis present

## 2017-07-21 DIAGNOSIS — I10 Essential (primary) hypertension: Secondary | ICD-10-CM | POA: Diagnosis not present

## 2017-07-21 DIAGNOSIS — R0600 Dyspnea, unspecified: Secondary | ICD-10-CM | POA: Diagnosis not present

## 2017-07-21 DIAGNOSIS — Z7982 Long term (current) use of aspirin: Secondary | ICD-10-CM | POA: Diagnosis not present

## 2017-07-21 DIAGNOSIS — E119 Type 2 diabetes mellitus without complications: Secondary | ICD-10-CM | POA: Diagnosis present

## 2017-07-21 DIAGNOSIS — K859 Acute pancreatitis without necrosis or infection, unspecified: Secondary | ICD-10-CM | POA: Diagnosis not present

## 2017-07-21 DIAGNOSIS — N179 Acute kidney failure, unspecified: Secondary | ICD-10-CM | POA: Diagnosis present

## 2017-07-21 DIAGNOSIS — I5030 Unspecified diastolic (congestive) heart failure: Secondary | ICD-10-CM | POA: Diagnosis present

## 2017-07-21 DIAGNOSIS — R1013 Epigastric pain: Secondary | ICD-10-CM | POA: Diagnosis present

## 2017-07-21 DIAGNOSIS — Z8249 Family history of ischemic heart disease and other diseases of the circulatory system: Secondary | ICD-10-CM | POA: Diagnosis not present

## 2017-07-21 DIAGNOSIS — F17210 Nicotine dependence, cigarettes, uncomplicated: Secondary | ICD-10-CM | POA: Diagnosis present

## 2017-07-21 DIAGNOSIS — E860 Dehydration: Secondary | ICD-10-CM | POA: Diagnosis present

## 2017-07-21 DIAGNOSIS — K700 Alcoholic fatty liver: Secondary | ICD-10-CM | POA: Diagnosis present

## 2017-07-21 DIAGNOSIS — Z23 Encounter for immunization: Secondary | ICD-10-CM | POA: Diagnosis not present

## 2017-07-21 LAB — COMPREHENSIVE METABOLIC PANEL
ALBUMIN: 2.9 g/dL — AB (ref 3.5–5.0)
ALT: 35 U/L (ref 14–54)
AST: 45 U/L — AB (ref 15–41)
Alkaline Phosphatase: 43 U/L (ref 38–126)
Anion gap: 11 (ref 5–15)
BUN: 17 mg/dL (ref 6–20)
CHLORIDE: 109 mmol/L (ref 101–111)
CO2: 18 mmol/L — ABNORMAL LOW (ref 22–32)
CREATININE: 0.7 mg/dL (ref 0.44–1.00)
Calcium: 8.2 mg/dL — ABNORMAL LOW (ref 8.9–10.3)
GFR calc Af Amer: >60 mL/min (ref >60)
GFR calc non Af Amer: >60 mL/min (ref >60)
GLUCOSE: 89 mg/dL (ref 65–99)
POTASSIUM: 3.4 mmol/L — AB (ref 3.5–5.1)
Sodium: 138 mmol/L (ref 135–145)
Total Bilirubin: 0.6 mg/dL (ref 0.3–1.2)
Total Protein: 5.6 g/dL — ABNORMAL LOW (ref 6.5–8.1)

## 2017-07-21 LAB — LIPASE, BLOOD: Lipase: 866 U/L — ABNORMAL HIGH (ref 11–51)

## 2017-07-21 LAB — GLUCOSE, CAPILLARY
GLUCOSE-CAPILLARY: 169 mg/dL — AB (ref 65–99)
GLUCOSE-CAPILLARY: 189 mg/dL — AB (ref 65–99)
GLUCOSE-CAPILLARY: 70 mg/dL (ref 65–99)
Glucose-Capillary: 134 mg/dL — ABNORMAL HIGH (ref 65–99)
Glucose-Capillary: 150 mg/dL — ABNORMAL HIGH (ref 65–99)
Glucose-Capillary: 73 mg/dL (ref 65–99)
Glucose-Capillary: 91 mg/dL (ref 65–99)

## 2017-07-21 LAB — CBC
HEMATOCRIT: 30.3 % — AB (ref 36.0–46.0)
Hemoglobin: 10 g/dL — ABNORMAL LOW (ref 12.0–15.0)
MCH: 31.5 pg (ref 26.0–34.0)
MCHC: 33 g/dL (ref 30.0–36.0)
MCV: 95.6 fL (ref 78.0–100.0)
PLATELETS: 152 10*3/uL (ref 150–400)
RBC: 3.17 MIL/uL — AB (ref 3.87–5.11)
RDW: 12.9 % (ref 11.5–15.5)
WBC: 4.2 10*3/uL (ref 4.0–10.5)

## 2017-07-21 LAB — HIV ANTIBODY (ROUTINE TESTING W REFLEX): HIV SCREEN 4TH GENERATION: NONREACTIVE

## 2017-07-21 MED ORDER — BOOST / RESOURCE BREEZE PO LIQD CUSTOM
1.0000 | Freq: Three times a day (TID) | ORAL | Status: DC
  Administered 2017-07-21: 1 via ORAL
  Administered 2017-07-21: 22:00:00 via ORAL
  Administered 2017-07-22: 1 via ORAL

## 2017-07-21 NOTE — Progress Notes (Signed)
Initial Nutrition Assessment  DOCUMENTATION CODES:   Not applicable  INTERVENTION:  Boost Breeze po TID, each supplement provides 250 kcal and 9 grams of protein RD to monitor diet advancement and order supplements as appropriate  NUTRITION DIAGNOSIS:   Increased nutrient needs related to acute illness as evidenced by estimated needs.  GOAL:   Patient will meet greater than or equal to 90% of their needs  MONITOR:   PO intake, Supplement acceptance, Diet advancement, Weight trends, I & O's, Labs, Skin  REASON FOR ASSESSMENT:   Malnutrition Screening Tool    ASSESSMENT:   57 y.o. F admitted on 07/19/17 for dehydration and acute alcoholic pancreatitis. PMH of fibromyalgia, chronic back pain, HLD, HTN, AKI, T2DM, and tobacco use. Pt current smoker.   Pt reports weighing 162 lbs 3 years ago, but now weighs 110 lbs. Report not confirmed by chart. Pt reports that within the last year she only lost 7 pounds - not significant.   Pt reports typically not having a good intake; most days are poor with pt only eating a fistful a day. Pt reports that on a good day she would eat 2 meals a day two-fistfuls at each meal; pt reports that these days only come around a couple of times of month. Pt reports this lack of appetite comes from stress of taking care of her husband and her back pain/fibromyalgia. Pt reports trying to get a pill to help her gain weight, but could not specify the medication.  Discussed with pt that she could increase her kcal and protein intake by using some techniques including eating small frequent meals, eating protein foods first, adding kcal to foods she is already eating like butter to mashed potatoes, and adding supplements like ensure. Pt reports only liking chocolate ensure and prefers boost, but she typically won't drink any at home; her husband gets them from the [place]. Pt is open to boost breeze while on a clear liquid diet and reports liking the clear liquids she is  currently on.   Medications reviewed: ensure enlive BID, buspar, cymbalta, novolog 0-9 units, robaxin, 0.9% NaCl 125 ml/hr.   Labs reviewed: K+ 3.4 (L), CO2 18 (L), albumin 2.9 (L), lipase 866 (H), AST 45 (H), total protein 5.6 (L), RBC 3.17 (L), hemoglobin 10 (L), HCT 30 (L).   NUTRITION - FOCUSED PHYSICAL EXAM:  Pt wearing thick pants; unable to assess thigh/patellar region.    Most Recent Value  Orbital Region  No depletion  Upper Arm Region  Moderate depletion  Thoracic and Lumbar Region  No depletion  Buccal Region  No depletion  Temple Region  No depletion  Clavicle Bone Region  Mild depletion  Clavicle and Acromion Bone Region  No depletion  Scapular Bone Region  No depletion  Dorsal Hand  No depletion  Patellar Region Unable to assess  Anterior Thigh Region Unable to assess  Posterior Calf Region  No depletion  Edema (RD Assessment)  None  Hair  Reviewed  Eyes  Reviewed  Mouth  Reviewed  Skin  Reviewed  Nails  Reviewed       Diet Order:   Diet Order           Diet clear liquid Room service appropriate? Yes; Fluid consistency: Thin  Diet effective now          EDUCATION NEEDS:   Education needs have been addressed  Skin:  Skin Assessment: Reviewed RN Assessment  Last BM:  07/19/17  Height:   Ht Readings  from Last 1 Encounters:  07/19/17 5' (1.524 m)    Weight:   Wt Readings from Last 1 Encounters:  07/19/17 110 lb (49.9 kg)   UBW: variable 103-158 since 2013: stated weights except for 46.8 kg 09/2016.  %UBW: 107%  Ideal Body Weight:  45.45 kg  BMI:  Body mass index is 21.48 kg/m.  Estimated Nutritional Needs:   Kcal:  1300-1500 kcal  Protein:  65-80 grams  Fluid:  >/= 1.3 L    Sherrine Maples, Dietetic Intern

## 2017-07-21 NOTE — Progress Notes (Signed)
[facility] Progress Note  Patient: [name] IRJ:[id number]   PCP: Dorothyann Peng, MD DOB: 08/25/1960   DOA: 07/19/2017   DOS: 07/21/2017   Date of Service: the patient was seen and examined on 07/21/2017  Subjective: Abdominal pain is getting better.  No nausea.  No vomiting further.  Had a bowel movement this morning.  Brief hospital course: Pt. with PMH of chronic back pain, HLD, HTN, type 2 diabetes mellitus-diet-controlled, active smoker, alcohol use; admitted on 07/19/2017, presented with complaint of abdominal pain, nausea and vomiting and shortness of breath, was found to have acute pancreatitis from alcohol. Currently further plan is continue current management.  Assessment and Plan: 1.  Acute alcoholic pancreatitis. Although patient reports that she is only drinking ?1.5 day her AST ALT ratio suggests that she has alcohol induced liver damage. CT scan is not showing any pancreatitis or other acute abnormality but lipase is 1500 on admission.  Lipase still elevated although 800 AST is 30 also trending down. Continues to have abdominal pain although getting better. Continue conservative management, With improvement in pain as well as having bowel movement I would advance to clear liquid diet and if tolerates diet advance to full liquid diet for tonight  2.  Shortness of breath. Reported history of shortness of breath although patient does not have any chest pain or shortness of my evaluation. D-dimer was elevated and patient was supposed to go for a VQ scan although patient is on room air, does not have any evidence of DVT in the lower extremity does not have any risk factor for do not think the patient requires. Elevated d-dimer can be explained by her having pink otitis. No further work-up at present and monitor. Echocardiogram shows preserved EF no acute abnormality.  3.  Alcohol abuse. Monitor for withdrawal at present. She mentions that she does not drink but a  frequent basis last drink was 2 weeks ago.  4.  Active smoker. Cessation suggested. Monitor  Diet: Full liquid diet DVT Prophylaxis: subcutaneous Heparin  Advance goals of care discussion: full code  Family Communication: no family was present at bedside, at the time of interview.   Disposition:  Discharge to home.  Consultants: none Procedures: none  Antibiotics: Anti-infectives (From admission, onward)   None       Objective: Physical Exam: Vitals:   07/21/17 0015 07/21/17 0409 07/21/17 0800 07/21/17 1303  BP:  107/74  (!) 130/95  Pulse: 76 75  81  Resp:  (!) 28  18  Temp:  98.3 F (36.8 C)  (!) 97.5 F (36.4 C)  TempSrc:  Oral  Axillary  SpO2: 100% 99% 98% 100%  Weight:      Height:        Intake/Output Summary (Last 24 hours) at 07/21/2017 1657 Last data filed at 07/21/2017 1000 Gross per 24 hour  Intake 500 ml  Output -  Net 500 ml   Filed Weights   07/19/17 1438  Weight: 49.9 kg (110 lb)   General: Alert, Awake and Oriented to Time, Place and Person. Appear in moderate distress, affect appropriate Eyes: PERRL, Conjunctiva normal ENT: Oral Mucosa clear moist. Neck: no JVD, no Abnormal Mass Or lumps Cardiovascular: S1 and S2 Present, no Murmur, Peripheral Pulses Present Respiratory: normal respiratory effort, Bilateral Air entry equal and Decreased, no use of accessory muscle, Clear to Auscultation, no Crackles, no wheezes Abdomen: Bowel Sound present, Soft and no tenderness, no hernia Skin: no redness, no Rash, no induration Extremities: no  Pedal edema, no calf tenderness Neurologic: Grossly no focal neuro deficit. Bilaterally Equal motor strength  Data Reviewed: CBC: Recent Labs  Lab 07/19/17 1554 07/20/17 0529 07/21/17 0550  WBC 6.5 5.1 4.2  NEUTROABS 5.1  --   --   HGB 14.2 10.5* 10.0*  HCT 40.9 31.0* 30.3*  MCV 94.0 93.7 95.6  PLT 194 161 152   Basic Metabolic Panel: Recent Labs  Lab 07/19/17 1554 07/20/17 0529 07/21/17 0550    NA 132* 132* 138  K 3.5 3.3* 3.4*  CL 95* 100* 109  CO2 23 23 18*  GLUCOSE 164* 96 89  BUN 39* 34* 17  CREATININE 1.69* 1.22* 0.70  CALCIUM 10.1 8.6* 8.2*  MG 1.6* 1.8  --   PHOS 3.1 2.2*  --     Liver Function Tests: Recent Labs  Lab 07/19/17 1554 07/20/17 0529 07/21/17 0550  AST 105* 61* 45*  ALT 67* 45 35  ALKPHOS 62 44 43  BILITOT 1.0 0.9 0.6  PROT 8.4* 6.4* 5.6*  ALBUMIN 4.3 3.3* 2.9*   Recent Labs  Lab 07/19/17 1554 07/20/17 0529 07/21/17 0550  LIPASE 1,575* 1,010* 866*   No results for input(s): AMMONIA in the last 168 hours. Coagulation Profile: No results for input(s): INR, PROTIME in the last 168 hours. Cardiac Enzymes: Recent Labs  Lab 07/20/17 0028 07/20/17 0529 07/20/17 1126  TROPONINI <0.03 <0.03 <0.03   BNP (last 3 results) No results for input(s): PROBNP in the last 8760 hours. CBG: Recent Labs  Lab 07/21/17 0013 07/21/17 0408 07/21/17 0738 07/21/17 1121 07/21/17 1602  GLUCAP 70 73 91 189* 169*   Studies: No results found.  Scheduled Meds: . aspirin  81 mg Oral Daily  . busPIRone  5 mg Oral BID  . DULoxetine  60 mg Oral Daily  . enoxaparin (LOVENOX) injection  40 mg Subcutaneous QHS  . feeding supplement  1 Container Oral TID BM  . gabapentin  300 mg Oral TID  . guaiFENesin  600 mg Oral BID  . insulin aspart  0-9 Units Subcutaneous Q4H  . ipratropium  0.5 mg Nebulization BID  . methocarbamol  500 mg Oral BID  . mometasone-formoterol  2 puff Inhalation BID  . rosuvastatin  10 mg Oral Daily   Continuous Infusions: . sodium chloride 125 mL/hr at 07/21/17 0950   PRN Meds: acetaminophen **OR** acetaminophen, albuterol, alum & mag hydroxide-simeth, ondansetron **OR** ondansetron (ZOFRAN) IV  Time spent: 35 minutes  Author: Lynden Oxford, MD [facility] Pager: [phone] 07/21/2017 4:57 PM  If 7PM-7AM, please contact night-coverage at [email or web address], password [facility]

## 2017-07-22 DIAGNOSIS — R945 Abnormal results of liver function studies: Secondary | ICD-10-CM

## 2017-07-22 DIAGNOSIS — R748 Abnormal levels of other serum enzymes: Secondary | ICD-10-CM

## 2017-07-22 DIAGNOSIS — K852 Alcohol induced acute pancreatitis without necrosis or infection: Principal | ICD-10-CM

## 2017-07-22 DIAGNOSIS — F1010 Alcohol abuse, uncomplicated: Secondary | ICD-10-CM

## 2017-07-22 LAB — CBC
HEMATOCRIT: 31.2 % — AB (ref 36.0–46.0)
HEMOGLOBIN: 10.2 g/dL — AB (ref 12.0–15.0)
MCH: 31.7 pg (ref 26.0–34.0)
MCHC: 32.7 g/dL (ref 30.0–36.0)
MCV: 96.9 fL (ref 78.0–100.0)
Platelets: 194 10*3/uL (ref 150–400)
RBC: 3.22 MIL/uL — ABNORMAL LOW (ref 3.87–5.11)
RDW: 12.8 % (ref 11.5–15.5)
WBC: 3.4 10*3/uL — ABNORMAL LOW (ref 4.0–10.5)

## 2017-07-22 LAB — GLUCOSE, CAPILLARY
Glucose-Capillary: 93 mg/dL (ref 65–99)
Glucose-Capillary: 95 mg/dL (ref 65–99)
Glucose-Capillary: 105 mg/dL — ABNORMAL HIGH (ref 65–99)
Glucose-Capillary: 158 mg/dL — ABNORMAL HIGH (ref 65–99)
Glucose-Capillary: 140 mg/dL — ABNORMAL HIGH (ref 65–99)

## 2017-07-22 LAB — COMPREHENSIVE METABOLIC PANEL
ALBUMIN: 3.1 g/dL — AB (ref 3.5–5.0)
ALK PHOS: 45 U/L (ref 38–126)
ALT: 33 U/L (ref 14–54)
AST: 38 U/L (ref 15–41)
Anion gap: 6 (ref 5–15)
BILIRUBIN TOTAL: 0.3 mg/dL (ref 0.3–1.2)
BUN: 6 mg/dL (ref 6–20)
CALCIUM: 8.2 mg/dL — AB (ref 8.9–10.3)
CO2: 20 mmol/L — AB (ref 22–32)
CREATININE: 0.66 mg/dL (ref 0.44–1.00)
Chloride: 114 mmol/L — ABNORMAL HIGH (ref 101–111)
GFR calc non Af Amer: >60 mL/min (ref >60)
GLUCOSE: 101 mg/dL — AB (ref 65–99)
Potassium: 3.6 mmol/L (ref 3.5–5.1)
SODIUM: 140 mmol/L (ref 135–145)
TOTAL PROTEIN: 5.8 g/dL — AB (ref 6.5–8.1)

## 2017-07-22 LAB — MAGNESIUM: Magnesium: 1.2 mg/dL — ABNORMAL LOW (ref 1.7–2.4)

## 2017-07-22 LAB — LIPASE, BLOOD: LIPASE: 968 U/L — AB (ref 11–51)

## 2017-07-22 MED ORDER — INSULIN ASPART 100 UNIT/ML ~~LOC~~ SOLN: 0.0000 [IU] | Freq: Every day | SUBCUTANEOUS | Status: DC

## 2017-07-22 MED ORDER — INSULIN ASPART 100 UNIT/ML ~~LOC~~ SOLN
0.0000 [IU] | Freq: Three times a day (TID) | SUBCUTANEOUS | Status: DC
  Administered 2017-07-22: 2 [IU] via SUBCUTANEOUS
  Administered 2017-07-23: 1 [IU] via SUBCUTANEOUS

## 2017-07-22 MED ORDER — MAGNESIUM SULFATE 2 GM/50ML IV SOLN
2.0000 g | Freq: Once | INTRAVENOUS | Status: AC
  Administered 2017-07-22: 2 g via INTRAVENOUS
  Filled 2017-07-22: qty 50

## 2017-07-22 NOTE — Progress Notes (Addendum)
PROGRESS NOTE [facility]   AVIONA MARTENSON   ZOX:[id number] DOB: 01/10/1961  DOA: 07/19/2017 PCP: Dorothyann Peng, MD   Brief Narrative:  [name] is a 57 year old female with medical history of hypertension, type 2 diabetes mellitus, chronic back pain and alcohol abuse who was admitted on 5/26 with complaint of abdominal pain and found to have acute pancreatitis.  Patient was treated conservatively with IV fluids and pain medication, diet was slowly advanced and patient tolerating well.  Subjective: Patient seen and examined, abdominal pain continues to improve.  Mild nauseous this morning but no further vomiting.  Continues to have bowel movement with no issues.  Afebrile.  Denies shortness of breath and chest pain.  Assessment & Plan: Acute alcoholic pancreatitis Elevated lipase on admission 1500, trending down although remains elevated at 900.  Patient clinically improving and tolerating diet well.  She wishes to advance diet.  Will advance diet as tolerated. LFTs trending down Continue supportive treatment, if tolerates soft diet today and tomorrow will DC home in a.m. CT abdomen did not show evidence of pancreatitis  AKI  Due to dehydration Responded well to IV fluids and renal function returned to baseline Monitor  Alcohol abuse Monitor for signs of withdrawal She reports that does not drink frequently Alcohol cessation discussed  Tobacco abuse Tobacco cessation discussed Nicotine patch  Remote history shortness of breath Patient was evaluated with d-dimer which was elevated, prior hospitalist felt that d-dimer elevation was related to active inflammation from pancreatitis.  Patient did not have evidence of DVT and did not feel that VQ scan was needed to rule out PE.  Patient is currently on room air with no signs of respiratory distress.  Shortness of breath has resolved.  No further work-up at this time, continue to monitor.  Echocardiogram was done which  showed preserved ejection fraction with no acute abnormality.  DVT prophylaxis: Heparin SQ Code Status: Full code Family Communication: None at bedside Disposition Plan: Home in a.m. if tolerates diet  Consultants:   None  Procedures:   None  Antimicrobials:  None   Objective: Vitals:   07/21/17 2027 07/22/17 0419 07/22/17 1107 07/22/17 1347  BP:  (!) 138/96  133/88  Pulse:  80  87  Resp:  18    Temp:  98 F (36.7 C)  97.6 F (36.4 C)  TempSrc:  Oral  Oral  SpO2: 100% 98% 96% 100%  Weight:      Height:        Intake/Output Summary (Last 24 hours) at 07/22/2017 1651 Last data filed at 07/22/2017 1435 Gross per 24 hour  Intake 2890 ml  Output -  Net 2890 ml   Filed Weights   07/19/17 1438  Weight: 49.9 kg (110 lb)    Examination:  General exam: Appears calm and comfortable  Respiratory system: Clear to auscultation. No wheezes,crackle or rhonchi Cardiovascular system: S1 & S2 heard, RRR. No JVD, murmurs, rubs or gallops Gastrointestinal system: Mild epigastric tenderness, no rebound or guarding.  Positive bowel sounds. Central nervous system: Alert and oriented. No focal neurological deficits. Extremities: No pedal edema Skin: No rashes, lesions or ulcers Psychiatry:  Mood & affect appropriate.    Data Reviewed: I have personally reviewed following labs and imaging studies  CBC: Recent Labs  Lab 07/19/17 1554 07/20/17 0529 07/21/17 0550 07/22/17 0551  WBC 6.5 5.1 4.2 3.4*  NEUTROABS 5.1  --   --   --   HGB 14.2 10.5* 10.0* 10.2*  HCT  40.9 31.0* 30.3* 31.2*  MCV 94.0 93.7 95.6 96.9  PLT 194 161 152 194   Basic Metabolic Panel: Recent Labs  Lab 07/19/17 1554 07/20/17 0529 07/21/17 0550 07/22/17 0551 07/22/17 0552  NA 132* 132* 138 140  --   K 3.5 3.3* 3.4* 3.6  --   CL 95* 100* 109 114*  --   CO2 23 23 18* 20*  --   GLUCOSE 164* 96 89 101*  --   BUN 39* 34* 17 6  --   CREATININE 1.69* 1.22* 0.70 0.66  --   CALCIUM 10.1 8.6* 8.2* 8.2*   --   MG 1.6* 1.8  --   --  1.2*  PHOS 3.1 2.2*  --   --   --    GFR: Estimated Creatinine Clearance: 55.7 mL/min (by C-G formula based on SCr of 0.66 mg/dL). Liver Function Tests: Recent Labs  Lab 07/19/17 1554 07/20/17 0529 07/21/17 0550 07/22/17 0551  AST 105* 61* 45* 38  ALT 67* 45 35 33  ALKPHOS 62 44 43 45  BILITOT 1.0 0.9 0.6 0.3  PROT 8.4* 6.4* 5.6* 5.8*  ALBUMIN 4.3 3.3* 2.9* 3.1*   Recent Labs  Lab 07/19/17 1554 07/20/17 0529 07/21/17 0550 07/22/17 0552  LIPASE 1,575* 1,010* 866* 968*   No results for input(s): AMMONIA in the last 168 hours. Coagulation Profile: No results for input(s): INR, PROTIME in the last 168 hours. Cardiac Enzymes: Recent Labs  Lab 07/20/17 0028 07/20/17 0529 07/20/17 1126  TROPONINI <0.03 <0.03 <0.03   BNP (last 3 results) No results for input(s): PROBNP in the last 8760 hours. HbA1C: Recent Labs    07/20/17 0529  HGBA1C 5.1   CBG: Recent Labs  Lab 07/21/17 2348 07/22/17 0418 07/22/17 0724 07/22/17 1049 07/22/17 1610  GLUCAP 150* 93 95 105* 158*   Lipid Profile: No results for input(s): CHOL, HDL, LDLCALC, TRIG, CHOLHDL, LDLDIRECT in the last 72 hours. Thyroid Function Tests: Recent Labs    07/20/17 0529  TSH 2.473   Anemia Panel: No results for input(s): VITAMINB12, FOLATE, FERRITIN, TIBC, IRON, RETICCTPCT in the last 72 hours. Sepsis Labs: Recent Labs  Lab 07/19/17 1554 07/19/17 1932  LATICACIDVEN 2.30* 0.85    No results found for this or any previous visit (from the past 240 hour(s)).    Radiology Studies: No results found.    Scheduled Meds: . aspirin  81 mg Oral Daily  . busPIRone  5 mg Oral BID  . DULoxetine  60 mg Oral Daily  . enoxaparin (LOVENOX) injection  40 mg Subcutaneous QHS  . feeding supplement  1 Container Oral TID BM  . gabapentin  300 mg Oral TID  . guaiFENesin  600 mg Oral BID  . insulin aspart  0-5 Units Subcutaneous QHS  . insulin aspart  0-9 Units Subcutaneous TID WC    . ipratropium  0.5 mg Nebulization BID  . methocarbamol  500 mg Oral BID  . mometasone-formoterol  2 puff Inhalation BID  . rosuvastatin  10 mg Oral Daily   Continuous Infusions: . sodium chloride 125 mL/hr at 07/22/17 1207     LOS: 1 day    Time spent: Total of 25 minutes spent with pt, greater than 50% of which was spent in discussion of  treatment, counseling and coordination of care   Latrelle Dodrill, MD Pager: Text Page via [email or web address]   If 7PM-7AM, please contact night-coverage [email or web address] 07/22/2017, 4:51 PM   Note - This record has been created using [employer]  software. Chart creation errors have been sought, but may not always have been located. Such creation errors do not reflect on the standard of medical care.

## 2017-07-23 DIAGNOSIS — E860 Dehydration: Secondary | ICD-10-CM

## 2017-07-23 DIAGNOSIS — I10 Essential (primary) hypertension: Secondary | ICD-10-CM

## 2017-07-23 DIAGNOSIS — R0600 Dyspnea, unspecified: Secondary | ICD-10-CM

## 2017-07-23 LAB — CBC WITH DIFFERENTIAL/PLATELET
Basophils Absolute: 0 10*3/uL (ref 0.0–0.1)
Basophils Relative: 1 %
EOS ABS: 0.1 10*3/uL (ref 0.0–0.7)
EOS PCT: 2 %
HCT: 27.8 % — ABNORMAL LOW (ref 36.0–46.0)
Hemoglobin: 9.2 g/dL — ABNORMAL LOW (ref 12.0–15.0)
LYMPHS ABS: 1 10*3/uL (ref 0.7–4.0)
Lymphocytes Relative: 28 %
MCH: 31.6 pg (ref 26.0–34.0)
MCHC: 33.1 g/dL (ref 30.0–36.0)
MCV: 95.5 fL (ref 78.0–100.0)
Monocytes Absolute: 0.5 10*3/uL (ref 0.1–1.0)
Monocytes Relative: 13 %
Neutro Abs: 2 10*3/uL (ref 1.7–7.7)
Neutrophils Relative %: 56 %
PLATELETS: 237 10*3/uL (ref 150–400)
RBC: 2.91 MIL/uL — AB (ref 3.87–5.11)
RDW: 13 % (ref 11.5–15.5)
WBC: 3.6 10*3/uL — AB (ref 4.0–10.5)

## 2017-07-23 LAB — BASIC METABOLIC PANEL
Anion gap: 6 (ref 5–15)
BUN: 5 mg/dL — AB (ref 6–20)
CALCIUM: 8.3 mg/dL — AB (ref 8.9–10.3)
CO2: 22 mmol/L (ref 22–32)
CREATININE: 0.64 mg/dL (ref 0.44–1.00)
Chloride: 113 mmol/L — ABNORMAL HIGH (ref 101–111)
GFR calc non Af Amer: >60 mL/min (ref >60)
Glucose, Bld: 100 mg/dL — ABNORMAL HIGH (ref 65–99)
Potassium: 3.5 mmol/L (ref 3.5–5.1)
SODIUM: 141 mmol/L (ref 135–145)

## 2017-07-23 LAB — GLUCOSE, CAPILLARY
GLUCOSE-CAPILLARY: 104 mg/dL — AB (ref 65–99)
GLUCOSE-CAPILLARY: 121 mg/dL — AB (ref 65–99)

## 2017-07-23 LAB — LIPASE, BLOOD: Lipase: 750 U/L — ABNORMAL HIGH (ref 11–51)

## 2017-07-23 LAB — MAGNESIUM: Magnesium: 1.4 mg/dL — ABNORMAL LOW (ref 1.7–2.4)

## 2017-07-23 MED ORDER — TRAMADOL HCL 50 MG PO TABS: 50.0000 mg | ORAL_TABLET | Freq: Four times a day (QID) | ORAL | 0 refills | Status: AC | PRN

## 2017-07-23 MED ORDER — ONDANSETRON HCL 4 MG PO TABS: 4.0000 mg | ORAL_TABLET | Freq: Four times a day (QID) | ORAL | 0 refills | Status: DC | PRN

## 2017-07-23 MED ORDER — BUSPIRONE HCL 5 MG PO TABS: 5.0000 mg | ORAL_TABLET | Freq: Two times a day (BID) | ORAL | 0 refills | Status: DC

## 2017-07-23 MED ORDER — IRBESARTAN 75 MG PO TABS
37.5000 mg | ORAL_TABLET | Freq: Every day | ORAL | Status: DC
  Administered 2017-07-23: 37.5 mg via ORAL
  Filled 2017-07-23: qty 1

## 2017-07-23 MED ORDER — MAGNESIUM SULFATE 2 GM/50ML IV SOLN
2.0000 g | Freq: Once | INTRAVENOUS | Status: AC
  Administered 2017-07-23: 2 g via INTRAVENOUS
  Filled 2017-07-23: qty 50

## 2017-07-23 MED ORDER — ALUM & MAG HYDROXIDE-SIMETH 200-200-20 MG/5ML PO SUSP: 15.0000 mL | ORAL | 0 refills | Status: AC | PRN

## 2017-07-23 NOTE — Discharge Instructions (Signed)
Acute Pancreatitis Acute pancreatitis is a condition in which the pancreas suddenly gets irritated and swollen (has inflammation). The pancreas is a large gland behind the stomach. It makes enzymes that help to digest food. The pancreas also makes hormones that help to control your blood sugar. Acute pancreatitis happens when the enzymes attack the pancreas and damage it. Most attacks last a couple of days and can cause serious problems. Follow these instructions at home: Eating and drinking  Follow instructions from your doctor about diet. You may need to: ? Avoid alcohol. ? Limit how much fat is in your diet.  Eat small meals often. Avoid eating big meals.  Drink enough fluid to keep your pee (urine) clear or pale yellow.  Do not drink alcohol if it caused your condition. General instructions  Take over-the-counter and prescription medicines only as told by your doctor.  Do not use any tobacco products. These include cigarettes, chewing tobacco, and e-cigarettes. If you need help quitting, ask your doctor.  Get plenty of rest.  If directed, check your blood sugar at home as told by your doctor.  Keep all follow-up visits as told by your doctor. This is important. Contact a doctor if:  You do not get better as quickly as expected.  You have new symptoms.  Your symptoms get worse.  You have lasting pain or weakness.  You continue to feel sick to your stomach (nauseous).  You get better and then you have another pain attack.  You have a fever. Get help right away if:  You cannot eat or keep fluids down.  Your pain becomes very bad.  Your skin or the white part of your eyes turns yellow (jaundice).  You throw up (vomit).  You feel dizzy or you pass out (faint).  Your blood sugar is high (over 300 mg/dL). This information is not intended to replace advice given to you by your health care provider. Make sure you discuss any questions you have with your health care  provider. Document Released: 07/30/2007 Document Revised: 07/19/2015 Document Reviewed: 11/14/2014 Elsevier Interactive Patient Education  2018 Elsevier Inc.  

## 2017-07-23 NOTE — Discharge Summary (Signed)
Physician Discharge Summary  [name]  BJY:[id number]  DOB: 03/21/1960  DOA: 07/19/2017 PCP: Dorothyann Peng, MD  Admit date: 07/19/2017 Discharge date: 07/23/2017  Admitted From: Home  Disposition: Home   Recommendations for Outpatient Follow-up:  1. Follow up with PCP in 1 week 2. Please obtain CMP/CBC in one week to monitor renal function, LFT's and Hgb   Discharge Condition: Stable   CODE STATUS: Full Code  Diet recommendation: Heart Healthy / Carb Modified   Brief/Interim Summary: For full details see H&P/Progress note, but in brief, [name] is a is a 57 year old female with medical history of hypertension, type 2 diabetes mellitus, chronic back pain and alcohol abuse who was admitted on 5/26 with complaint of abdominal pain and found to have acute pancreatitis.  Patient was treated conservatively with IV fluids and pain medication, diet was slowly advanced and patient tolerating well. Given patient was deemed stable for discharge to follow-up with primary care doctor.  Subjective: Patient seen and examined, abdominal pain is minimal.  She tolerated soft diet well.  She was mild nauseous this morning without any emesis, however patient reported that she is chronically nauseous.  Denies shortness of breath and chest pain.  Remains afebrile.  No acute events overnight  Discharge Diagnoses/Hospital Course:  Acute alcoholic pancreatitis Elevated lipase on admission 1500,now trending down. Patient clinically improving and tolerating diet well. CT abdomen did not show evidence of pancreatitis Was treated with conservative measure, IV fluids and pain medication. Will discharge home with Zofran and tramadol  AKI  Due to dehydration Responded well to IV fluids and renal function returned to baseline Check renal function in 1 week  Transaminitis Felt to be secondary to hepatic steatosis, likely from alcohol abuse Check LFTs in 1 week, follow-up with PCP  HTN  BP  slight elevated upon discharge, due to aggressive hydration and discontinuation of Benicar due to initial AKI. Benicar resumed prior to d/c. Continue home dosed and follow up with PCP in 1 week.   Alcohol abuse No signs of alcohol withdrawal during hospital stay She reports that does not drink frequently Alcohol cessation discussed  Tobacco abuse Tobacco cessation discussed Nicotine patch  Remote history shortness of breath - resolved  Patient was evaluated with d-dimer which was elevated, prior hospitalist felt that d-dimer elevation was related to active inflammation from pancreatitis. Patient is currently on room air with no signs of respiratory distress.  Shortness of breath has resolved.  No further work-up at this time, continue to monitor. Echocardiogram was done which showed preserved ejection fraction with no acute abnormality.  All other chronic medical condition were stable during the hospitalization.  On the day of the discharge the patient's vitals were stable, and no other acute medical condition were reported by patient. the patient was felt safe to be discharge to home   Discharge Instructions  You were cared for by a hospitalist during your hospital stay. If you have any questions about your discharge medications or the care you received while you were in the hospital after you are discharged, you can call the unit and asked to speak with the hospitalist on call if the hospitalist that took care of you is not available. Once you are discharged, your primary care physician will handle any further medical issues. Please note that NO REFILLS for any discharge medications will be authorized once you are discharged, as it is imperative that you return to your primary care physician (or establish a relationship with a  primary care physician if you do not have one) for your aftercare needs so that they can reassess your need for medications and monitor your lab values.  Discharge  Instructions    Call MD for:  difficulty breathing, headache or visual disturbances   Complete by:  As directed    Call MD for:  extreme fatigue   Complete by:  As directed    Call MD for:  hives   Complete by:  As directed    Call MD for:  persistant dizziness or light-headedness   Complete by:  As directed    Call MD for:  persistant nausea and vomiting   Complete by:  As directed    Call MD for:  redness, tenderness, or signs of infection (pain, swelling, redness, odor or green/yellow discharge around incision site)   Complete by:  As directed    Call MD for:  severe uncontrolled pain   Complete by:  As directed    Call MD for:  temperature >100.4   Complete by:  As directed    Diet - low sodium heart healthy   Complete by:  As directed    Increase activity slowly   Complete by:  As directed      Allergies as of 07/23/2017      Reactions   Tylenol [acetaminophen] Itching   Pollen Extract    Shellfish Allergy Hives   Peanut-containing Drug Products Hives      Medication List    STOP taking these medications   HYDROcodone-acetaminophen 5-325 MG tablet Commonly known as:  NORCO/VICODIN     TAKE these medications   albuterol 108 (90 Base) MCG/ACT inhaler Commonly known as:  PROVENTIL HFA;VENTOLIN HFA Inhale 1 puff into the lungs every 6 (six) hours as needed for wheezing or shortness of breath.   alum & mag hydroxide-simeth 200-200-20 MG/5ML suspension Commonly known as:  MAALOX/MYLANTA Take 15 mLs by mouth every 4 (four) hours as needed for indigestion or heartburn.   aspirin 81 MG chewable tablet Chew 81 mg by mouth daily.   budesonide-formoterol 80-4.5 MCG/ACT inhaler Commonly known as:  SYMBICORT Inhale 2 puffs into the lungs as needed.   busPIRone 5 MG tablet Commonly known as:  BUSPAR Take 5 mg by mouth 2 (two) times daily.   DULoxetine 60 MG capsule Commonly known as:  CYMBALTA Take 60 mg by mouth daily.   gabapentin 300 MG capsule Commonly known  as:  NEURONTIN Take 1 capsule (300 mg total) 3 (three) times daily by mouth.   LUMIGAN OP Place 1 drop into both eyes daily as needed (dry eye).   Melatonin 5 MG Tabs Take 1 tablet by mouth at bedtime.   methocarbamol 500 MG tablet Commonly known as:  ROBAXIN Take 1 tablet (500 mg total) 2 (two) times daily by mouth.   olmesartan 5 MG tablet Commonly known as:  BENICAR Take 5 mg by mouth daily.   ondansetron 4 MG tablet Commonly known as:  ZOFRAN Take 1 tablet (4 mg total) by mouth every 6 (six) hours as needed for nausea.   rosuvastatin 10 MG tablet Commonly known as:  CRESTOR Take 10 mg by mouth daily.   traMADol 50 MG tablet Commonly known as:  ULTRAM Take 1 tablet (50 mg total) by mouth every 6 (six) hours as needed for up to 3 days.   VISINE OP Place 1 drop into both eyes daily.      Follow-up Information    Felecia Shelling, DPM.  Call in 2 days.   Specialty:  Podiatry Why:  to arrange a follow up appointment Contact information: [address] [place] [address] [phone]          Allergies  Allergen Reactions  . Tylenol [Acetaminophen] Itching  . Pollen Extract   . Shellfish Allergy Hives  . Peanut-Containing Drug Products Hives    Consultations:  None    Procedures/Studies: Dg Chest 2 View  Result Date: 07/19/2017 CLINICAL DATA:  Shortness of breath and cough EXAM: CHEST - 2 VIEW COMPARISON:  April 03, 2016 FINDINGS: There is no edema or consolidation. The heart size and pulmonary vascularity are normal. No adenopathy. There is aortic atherosclerosis. No evident bone lesions. IMPRESSION: Aortic atherosclerosis.  No evident edema or consolidation. Aortic Atherosclerosis (ICD10-I70.0). Electronically Signed   By: Bretta Bang III M.D.   On: 07/19/2017 15:18   Ct Abdomen Pelvis W Contrast  Result Date: 07/19/2017 CLINICAL DATA:  Abdominal pain.  Diverticulitis suspected. EXAM: CT ABDOMEN AND PELVIS WITH CONTRAST TECHNIQUE:  Multidetector CT imaging of the abdomen and pelvis was performed using the standard protocol following bolus administration of intravenous contrast. CONTRAST:  70mL ISOVUE-300 IOPAMIDOL (ISOVUE-300) INJECTION 61% COMPARISON:  None. FINDINGS: Lower chest: Clear lung bases.  Heart normal in size. Hepatobiliary: Liver is borderline enlarged with diffuse decreased attenuation consistent fatty infiltration. No liver mass or focal lesion. Normal gallbladder. No bile duct dilation. Pancreas: Unremarkable. No pancreatic ductal dilatation or surrounding inflammatory changes. Spleen: Normal in size without focal abnormality. Adrenals/Urinary Tract: Adrenal glands are unremarkable. Kidneys are normal, without renal calculi, focal lesion, or hydronephrosis. Bladder is unremarkable. Stomach/Bowel: There are numerous colonic diverticula, more prominent on the left. No evidence of diverticulitis. No colonic wall thickening or inflammation. Stomach and small bowel are unremarkable. Normal appendix visualized. Vascular/Lymphatic: Aortic atherosclerosis. No enlarged abdominal or pelvic lymph nodes. Reproductive: Status post hysterectomy. No adnexal masses. Other: No abdominal wall hernia or abnormality. No abdominopelvic ascites. Musculoskeletal: No fracture or acute finding. No osteoblastic or osteolytic lesions. IMPRESSION: 1. No acute findings. 2. Numerous colonic diverticula without evidence of diverticulitis. 3. Hepatic steatosis. 4. Aortic atherosclerosis. Electronically Signed   By: Amie Portland M.D.   On: 07/19/2017 17:40   US Abdomen Limited Ruq  Result Date: 07/19/2017 CLINICAL DATA:  Elevated liver enzymes EXAM: ULTRASOUND ABDOMEN LIMITED RIGHT UPPER QUADRANT COMPARISON:  CT abdomen and pelvis Jul 19, 2017 FINDINGS: Gallbladder: No gallstones or wall thickening visualized. There is no pericholecystic fluid. No sonographic Murphy sign noted by sonographer. Common bile duct: Diameter: 4 mm. There is no intrahepatic or  extrahepatic biliary duct dilatation. Liver: No focal lesion identified. Liver echogenicity is increased diffusely. Portal vein is patent on color Doppler imaging with normal direction of blood flow towards the liver. IMPRESSION: Diffuse increase in liver echogenicity, a finding felt to be indicative of hepatic steatosis. While no focal liver lesions are evident on this study, it must be cautioned that the sensitivity of ultrasound for detection of focal liver lesions is diminished in this circumstance. Study otherwise unremarkable. Electronically Signed   By: Bretta Bang III M.D.   On: 07/19/2017 21:56    Discharge Exam: Vitals:   07/23/17 0904 07/23/17 1144  BP:  (!) 155/108  Pulse:  92  Resp:    Temp:  98.1 F (36.7 C)  SpO2: 99% 100%   Vitals:   07/22/17 2112 07/22/17 2113 07/23/17 0904 07/23/17 1144  BP: (!) 149/89 (!) 151/88  (!) 155/108  Pulse:  84 80  92  Resp:      Temp: 97.8 F (36.6 C)   98.1 F (36.7 C)  TempSrc: Oral   Oral  SpO2: 100%  99% 100%  Weight:      Height:        General: Pt is alert, awake, not in acute distress Cardiovascular: RRR, S1/S2 +, no rubs, no gallops Respiratory: CTA bilaterally, no wheezing, no rhonchi Abdominal: Soft, ND, mild epigastric and RLQ tenderness. No rebound or guarding. abd benign Extremities: no edema, no cyanosis   The results of significant diagnostics from this hospitalization (including imaging, microbiology, ancillary and laboratory) are listed below for reference.     Microbiology: No results found for this or any previous visit (from the past 240 hour(s)).   Labs: BNP (last 3 results) No results for input(s): BNP in the last 8760 hours. Basic Metabolic Panel: Recent Labs  Lab 07/19/17 1554 07/20/17 0529 07/21/17 0550 07/22/17 0551 07/22/17 0552 07/23/17 0636  NA 132* 132* 138 140  --  141  K 3.5 3.3* 3.4* 3.6  --  3.5  CL 95* 100* 109 114*  --  113*  CO2 23 23 18* 20*  --  22  GLUCOSE 164* 96 89 101*   --  100*  BUN 39* 34* 17 6  --  5*  CREATININE 1.69* 1.22* 0.70 0.66  --  0.64  CALCIUM 10.1 8.6* 8.2* 8.2*  --  8.3*  MG 1.6* 1.8  --   --  1.2* 1.4*  PHOS 3.1 2.2*  --   --   --   --    Liver Function Tests: Recent Labs  Lab 07/19/17 1554 07/20/17 0529 07/21/17 0550 07/22/17 0551  AST 105* 61* 45* 38  ALT 67* 45 35 33  ALKPHOS 62 44 43 45  BILITOT 1.0 0.9 0.6 0.3  PROT 8.4* 6.4* 5.6* 5.8*  ALBUMIN 4.3 3.3* 2.9* 3.1*   Recent Labs  Lab 07/19/17 1554 07/20/17 0529 07/21/17 0550 07/22/17 0552 07/23/17 0636  LIPASE 1,575* 1,010* 866* 968* 750*   No results for input(s): AMMONIA in the last 168 hours. CBC: Recent Labs  Lab 07/19/17 1554 07/20/17 0529 07/21/17 0550 07/22/17 0551 07/23/17 0636  WBC 6.5 5.1 4.2 3.4* 3.6*  NEUTROABS 5.1  --   --   --  2.0  HGB 14.2 10.5* 10.0* 10.2* 9.2*  HCT 40.9 31.0* 30.3* 31.2* 27.8*  MCV 94.0 93.7 95.6 96.9 95.5  PLT 194 161 152 194 237   Cardiac Enzymes: Recent Labs  Lab 07/20/17 0028 07/20/17 0529 07/20/17 1126  TROPONINI <0.03 <0.03 <0.03   BNP: Invalid input(s): POCBNP CBG: Recent Labs  Lab 07/22/17 1049 07/22/17 1610 07/22/17 2116 07/23/17 0726 07/23/17 1146  GLUCAP 105* 158* 140* 104* 121*   D-Dimer No results for input(s): DDIMER in the last 72 hours. Hgb A1c No results for input(s): HGBA1C in the last 72 hours. Lipid Profile No results for input(s): CHOL, HDL, LDLCALC, TRIG, CHOLHDL, LDLDIRECT in the last 72 hours. Thyroid function studies No results for input(s): TSH, T4TOTAL, T3FREE, THYROIDAB in the last 72 hours.  Invalid input(s): FREET3 Anemia work up No results for input(s): VITAMINB12, FOLATE, FERRITIN, TIBC, IRON, RETICCTPCT in the last 72 hours. Urinalysis    Component Value Date/Time   COLORURINE YELLOW 07/19/2017 1530   APPEARANCEUR HAZY (A) 07/19/2017 1530   LABSPEC 1.026 07/19/2017 1530   PHURINE 5.0 07/19/2017 1530   GLUCOSEU NEGATIVE 07/19/2017 1530   HGBUR NEGATIVE  07/19/2017 1530  BILIRUBINUR NEGATIVE 07/19/2017 1530   KETONESUR NEGATIVE 07/19/2017 1530   PROTEINUR NEGATIVE 07/19/2017 1530   UROBILINOGEN 0.2 03/31/2014 1148   NITRITE NEGATIVE 07/19/2017 1530   LEUKOCYTESUR NEGATIVE 07/19/2017 1530   Sepsis Labs Invalid input(s): PROCALCITONIN,  WBC,  LACTICIDVEN Microbiology No results found for this or any previous visit (from the past 240 hour(s)).   Time coordinating discharge: 35 minutes  SIGNED:  Latrelle Dodrill, MD  [facility] 07/23/2017, 12:01 PM  Pager please text page via  [email or web address]  Note - This record has been created using [employer]. Chart creation errors have been sought, but may not always have been located. Such creation errors do not reflect on the standard of medical care.

## 2017-07-31 ENCOUNTER — Encounter: Payer: Self-pay | Admitting: Podiatry

## 2017-07-31 ENCOUNTER — Ambulatory Visit (INDEPENDENT_AMBULATORY_CARE_PROVIDER_SITE_OTHER)

## 2017-07-31 ENCOUNTER — Ambulatory Visit (INDEPENDENT_AMBULATORY_CARE_PROVIDER_SITE_OTHER): Admitting: Podiatry

## 2017-07-31 VITALS — BP 134/81 | HR 82 | Resp 16

## 2017-07-31 DIAGNOSIS — M545 Low back pain, unspecified: Secondary | ICD-10-CM | POA: Insufficient documentation

## 2017-07-31 DIAGNOSIS — F419 Anxiety disorder, unspecified: Secondary | ICD-10-CM | POA: Insufficient documentation

## 2017-07-31 DIAGNOSIS — M722 Plantar fascial fibromatosis: Secondary | ICD-10-CM

## 2017-07-31 DIAGNOSIS — M2042 Other hammer toe(s) (acquired), left foot: Secondary | ICD-10-CM

## 2017-07-31 DIAGNOSIS — R000 Tachycardia, unspecified: Secondary | ICD-10-CM | POA: Insufficient documentation

## 2017-07-31 MED ORDER — TRIAMCINOLONE ACETONIDE 10 MG/ML IJ SUSP
10.0000 mg | Freq: Once | INTRAMUSCULAR | Status: AC
  Administered 2017-07-31: 10 mg

## 2017-08-02 NOTE — Progress Notes (Signed)
Subjective:   Patient ID: [name], female   DOB: 57 y.o.   MRN: [id number]   HPI Patient presents concerned about the mass on the plantar aspect of the right foot stating it still not tender but she wanted it checked again.  Patient is not currently smoking and likes to be active   Review of Systems  All other systems reviewed and are negative.       Objective:  Physical Exam  Constitutional: She appears well-developed and well-nourished.  Cardiovascular: Intact distal pulses.  Pulmonary/Chest: Effort normal.  Musculoskeletal: Normal range of motion.  Neurological: She is alert.  Skin: Skin is warm.  Nursing note and vitals reviewed.   Neurovascular status found to be intact muscle strength was adequate with range of motion within normal limits.  Patient does have a soft mass plantar aspect right arch in the distal portion measuring about 3 x 3 cm which appears to be within the plantar fascia and there is no proximal extension and no pain associated with it currently     Assessment:  Strong probability for plantar fibroma right     Plan:  H&P x-ray reviewed condition discussed at great length.  We discussed resection which she cannot do now due to her schedule but it may be necessary and I did discuss I cannot guarantee what this is without pathology.  She understands it completely and at this time we will get a try to shrink it and I did inject the plantar fascia and fibroma with 3 mg Kenalog 5 Milgram Xylocaine advised on heat therapy and will reappoint if symptoms persist or get worse  X-ray negative for signs of calcification or bony injury

## 2017-08-20 ENCOUNTER — Other Ambulatory Visit: Payer: Self-pay | Admitting: Gastroenterology

## 2017-08-20 DIAGNOSIS — R112 Nausea with vomiting, unspecified: Secondary | ICD-10-CM

## 2017-08-21 ENCOUNTER — Ambulatory Visit
Admission: RE | Admit: 2017-08-21 | Discharge: 2017-08-21 | Disposition: A | Discharge status: Home / Self Care | Source: Ambulatory Visit | Attending: Gastroenterology | Admitting: Gastroenterology

## 2017-08-21 DIAGNOSIS — R112 Nausea with vomiting, unspecified: Secondary | ICD-10-CM

## 2017-08-21 MED ORDER — IOPAMIDOL (ISOVUE-300) INJECTION 61%
100.0000 mL | Freq: Once | INTRAVENOUS | Status: AC | PRN
  Administered 2017-08-21: 100 mL via INTRAVENOUS

## 2017-09-18 ENCOUNTER — Other Ambulatory Visit: Payer: Self-pay | Admitting: Neurosurgery

## 2017-09-18 DIAGNOSIS — M7138 Other bursal cyst, other site: Secondary | ICD-10-CM

## 2017-09-21 ENCOUNTER — Encounter: Payer: Self-pay | Admitting: Gynecology

## 2017-10-05 ENCOUNTER — Ambulatory Visit
Admission: RE | Admit: 2017-10-05 | Discharge: 2017-10-05 | Disposition: A | Discharge status: Home / Self Care | Source: Ambulatory Visit | Attending: Neurosurgery | Admitting: Neurosurgery

## 2017-10-05 DIAGNOSIS — M7138 Other bursal cyst, other site: Secondary | ICD-10-CM

## 2017-10-28 DIAGNOSIS — E1165 Type 2 diabetes mellitus with hyperglycemia: Secondary | ICD-10-CM

## 2017-10-28 DIAGNOSIS — J209 Acute bronchitis, unspecified: Secondary | ICD-10-CM

## 2017-10-28 DIAGNOSIS — I10 Essential (primary) hypertension: Secondary | ICD-10-CM | POA: Diagnosis not present

## 2017-10-28 DIAGNOSIS — M545 Low back pain: Secondary | ICD-10-CM

## 2017-10-28 DIAGNOSIS — F1021 Alcohol dependence, in remission: Secondary | ICD-10-CM

## 2017-12-14 ENCOUNTER — Other Ambulatory Visit: Payer: Self-pay | Admitting: Nurse Practitioner

## 2017-12-16 ENCOUNTER — Other Ambulatory Visit: Payer: Self-pay | Admitting: Nurse Practitioner

## 2017-12-16 MED ORDER — GABAPENTIN 300 MG PO CAPS: 300.0000 mg | ORAL_CAPSULE | Freq: Three times a day (TID) | ORAL | 1 refills | Status: DC

## 2017-12-24 DIAGNOSIS — M180 Bilateral primary osteoarthritis of first carpometacarpal joints: Secondary | ICD-10-CM | POA: Insufficient documentation

## 2017-12-24 DIAGNOSIS — M720 Palmar fascial fibromatosis [Dupuytren]: Secondary | ICD-10-CM | POA: Insufficient documentation

## 2018-02-02 ENCOUNTER — Encounter: Payer: Self-pay | Admitting: Internal Medicine

## 2018-02-02 ENCOUNTER — Ambulatory Visit (INDEPENDENT_AMBULATORY_CARE_PROVIDER_SITE_OTHER): Admitting: Internal Medicine

## 2018-02-02 ENCOUNTER — Other Ambulatory Visit: Payer: Self-pay | Admitting: Nurse Practitioner

## 2018-02-02 VITALS — BP 126/86 | HR 97 | Temp 98.2°F | Ht 60.0 in | Wt 144.6 lb

## 2018-02-02 DIAGNOSIS — R81 Glycosuria: Secondary | ICD-10-CM | POA: Diagnosis not present

## 2018-02-02 DIAGNOSIS — Z720 Tobacco use: Secondary | ICD-10-CM | POA: Diagnosis not present

## 2018-02-02 DIAGNOSIS — J40 Bronchitis, not specified as acute or chronic: Secondary | ICD-10-CM

## 2018-02-02 DIAGNOSIS — R062 Wheezing: Secondary | ICD-10-CM | POA: Diagnosis not present

## 2018-02-02 DIAGNOSIS — J209 Acute bronchitis, unspecified: Secondary | ICD-10-CM | POA: Diagnosis not present

## 2018-02-02 LAB — POCT URINALYSIS DIPSTICK
Bilirubin, UA: NEGATIVE
Glucose, UA: POSITIVE — AB
KETONES UA: NEGATIVE
Leukocytes, UA: NEGATIVE
NITRITE UA: NEGATIVE
PROTEIN UA: POSITIVE — AB
RBC UA: NEGATIVE
SPEC GRAV UA: 1.02 (ref 1.010–1.025)
Urobilinogen, UA: 0.2 E.U./dL
pH, UA: 6 (ref 5.0–8.0)

## 2018-02-02 MED ORDER — DOXYCYCLINE HYCLATE 100 MG PO TABS: 100.0000 mg | ORAL_TABLET | Freq: Two times a day (BID) | ORAL | 0 refills | Status: DC

## 2018-02-02 MED ORDER — ALBUTEROL SULFATE HFA 108 (90 BASE) MCG/ACT IN AERS: 1.0000 | INHALATION_SPRAY | Freq: Four times a day (QID) | RESPIRATORY_TRACT | 0 refills | Status: DC | PRN

## 2018-02-02 NOTE — Progress Notes (Signed)
Subjective:     Patient ID: [name] , female    DOB: 04/26/1960 , 57 y.o.   MRN: [id number]   Chief Complaint  Patient presents with  . URI    Pt states she has been coughing a lot, nausea and chest congestion and sinus headache and has been going on for the past 2 days.    HPI 1-Has been rhinitis and coughing a lot x 4 days. She continues smoking. Has been wheezing and having cough attack since yesterday. Her cough is productive with light yellow to gray sputum.   2-Been having urinary incontinence, has been very nauseous. No dysuria. Wonders if she may have a UTI. Denies flank pain.    Past Medical History:  Diagnosis Date  . Acute kidney failure with lesion of tubular necrosis (HCC) 11/11/2016  . Anxiety   . Back pain   . Diabetes mellitus   . Essential hypertension 11/11/2016  . Fibromyalgia   . High cholesterol   . Hypertension   . Syncope 11/11/2016     Family History  Problem Relation Age of Onset  . Hypertension Mother   . Heart failure Mother   . Diabetes Father   . COPD Father   . Emphysema Father   . Heart failure Maternal Grandmother   . Cancer Maternal Grandfather        LUNG   . Heart disease Maternal Grandfather   . Lung cancer Maternal Grandfather      Current Outpatient Medications:  .  albuterol (PROVENTIL HFA;VENTOLIN HFA) 108 (90 Base) MCG/ACT inhaler, Inhale 1 puff into the lungs every 6 (six) hours as needed for wheezing or shortness of breath., Disp: , Rfl:  .  alum & mag hydroxide-simeth (MAALOX/MYLANTA) 200-200-20 MG/5ML suspension, Take 15 mLs by mouth every 4 (four) hours as needed for indigestion or heartburn., Disp: 355 mL, Rfl: 0 .  aspirin 81 MG chewable tablet, Chew 81 mg by mouth daily., Disp: , Rfl:  .  budesonide-formoterol (SYMBICORT) 80-4.5 MCG/ACT inhaler, Inhale 2 puffs into the lungs as needed., Disp: , Rfl:  .  DULoxetine (CYMBALTA) 60 MG capsule, Take 60 mg by mouth daily.  , Disp: , Rfl:  .  gabapentin (NEURONTIN) 300  MG capsule, Take 1 capsule (300 mg total) by mouth 3 (three) times daily., Disp: 180 capsule, Rfl: 1 .  Melatonin 5 MG TABS, Take 1 tablet by mouth at bedtime., Disp: , Rfl:  .  methocarbamol (ROBAXIN) 500 MG tablet, Take 1 tablet (500 mg total) 2 (two) times daily by mouth., Disp: 20 tablet, Rfl: 0 .  olmesartan (BENICAR) 5 MG tablet, Take 5 mg by mouth daily.  , Disp: , Rfl:  .  ondansetron (ZOFRAN) 4 MG tablet, Take 1 tablet (4 mg total) by mouth every 6 (six) hours as needed for nausea., Disp: 20 tablet, Rfl: 0 .  rosuvastatin (CRESTOR) 10 MG tablet, Take 10 mg by mouth daily.  , Disp: , Rfl:  .  Tetrahydrozoline HCl (VISINE OP), Place 1 drop into both eyes daily., Disp: , Rfl:    Allergies  Allergen Reactions  . Tylenol [Acetaminophen] Itching  . Pollen Extract   . Shellfish Allergy Hives  . Peanut-Containing Drug Products Hives     Review of Systems  Constitutional: Positive for chills, diaphoresis and fatigue. Negative for appetite change and fever.  HENT: Positive for congestion, postnasal drip and rhinorrhea. Negative for ear discharge, ear pain, sinus pressure, sinus pain, sore throat, trouble swallowing and voice change.  Eyes: Negative for discharge.  Respiratory: Positive for cough and wheezing. Negative for chest tightness and shortness of breath.   Cardiovascular: Negative for chest pain, palpitations and leg swelling.  Gastrointestinal: Negative for abdominal pain, nausea and vomiting.  Musculoskeletal: Negative for gait problem and myalgias.  Skin: Negative for rash.  Neurological: Negative for headaches.  Hematological: Negative for adenopathy.     Today's Vitals   02/02/18 1604  BP: 126/86  Pulse: 97  Temp: 98.2 F (36.8 C)  SpO2: 95%  Weight: 144 lb 9.6 oz (65.6 kg)  Height: 5' (1.524 m)  PainSc: 8   PainLoc: Abdomen   Body mass index is 28.24 kg/m.   Objective:  Physical Exam Vitals signs and nursing note reviewed.  Constitutional:      General:  She is not in acute distress.    Appearance: Normal appearance. She is ill-appearing. She is not toxic-appearing or diaphoretic.  HENT:     Head: Normocephalic.     Right Ear: Tympanic membrane, ear canal and external ear normal.     Left Ear: Tympanic membrane, ear canal and external ear normal.     Nose: Congestion present.     Mouth/Throat:     Mouth: Mucous membranes are moist.     Pharynx: Oropharynx is clear. No oropharyngeal exudate or posterior oropharyngeal erythema.  Eyes:     General: No scleral icterus.    Extraocular Movements: Extraocular movements intact.     Conjunctiva/sclera: Conjunctivae normal.     Pupils: Pupils are equal, round, and reactive to light.  Neck:     Musculoskeletal: Neck supple.     Vascular: No carotid bruit.  Cardiovascular:     Rate and Rhythm: Normal rate and regular rhythm.     Heart sounds: No murmur.  Pulmonary:     Effort: No respiratory distress.     Breath sounds: Wheezing present. No rhonchi or rales.     Comments: During initial exam during expirations she could not exhale due to having a lot of coughing, this resolved after duo neb.  Lymphadenopathy:     Cervical: No cervical adenopathy.  Skin:    General: Skin is warm and dry.  Neurological:     General: No focal deficit present.     Mental Status: She is alert and oriented to person, place, and time.  Psychiatric:        Mood and Affect: Mood normal.        Behavior: Behavior normal.        Thought Content: Thought content normal.        Judgment: Judgment normal.     UA shows +2 glucose    Assessment And Plan:    1. Glucosuria- possibly she is a diabetic again. She was in remission.  - Hemoglobin A1c - POCT Urinalysis Dipstick (81002)  2. Bronchitis- acute. I placed her on Doxy x 10 days as noted and albuterol inhaler as prescribed. If she gets worse in 72h needs to be seen again    [facility] HospitalYLVIA RODRIGUEZ-SOUTHWORTH, PA-C

## 2018-02-03 LAB — HEMOGLOBIN A1C
Est. average glucose Bld gHb Est-mCnc: 169 mg/dL
Hgb A1c MFr Bld: 7.5 % — ABNORMAL HIGH (ref 4.8–5.6)

## 2018-02-04 ENCOUNTER — Other Ambulatory Visit: Payer: Self-pay

## 2018-02-04 ENCOUNTER — Encounter: Payer: Self-pay | Admitting: Internal Medicine

## 2018-02-04 ENCOUNTER — Other Ambulatory Visit: Payer: Self-pay | Admitting: Internal Medicine

## 2018-02-04 MED ORDER — METFORMIN HCL ER 750 MG PO TB24: 750.0000 mg | ORAL_TABLET | Freq: Every day | ORAL | 2 refills | Status: DC

## 2018-02-04 NOTE — Progress Notes (Signed)
rx for metformin re-sent x 2, but continues to print. I will have Maely fax it.

## 2018-04-14 ENCOUNTER — Other Ambulatory Visit: Payer: Self-pay | Admitting: Nurse Practitioner

## 2018-04-19 ENCOUNTER — Other Ambulatory Visit: Payer: Self-pay | Admitting: Nurse Practitioner

## 2018-04-19 DIAGNOSIS — F329 Major depressive disorder, single episode, unspecified: Secondary | ICD-10-CM

## 2018-04-19 DIAGNOSIS — F32A Depression, unspecified: Secondary | ICD-10-CM

## 2018-04-19 MED ORDER — BUSPIRONE HCL 5 MG PO TABS: 5.0000 mg | ORAL_TABLET | Freq: Two times a day (BID) | ORAL | 1 refills | Status: DC

## 2018-04-28 ENCOUNTER — Encounter: Payer: Self-pay | Admitting: Nurse Practitioner

## 2018-04-28 ENCOUNTER — Ambulatory Visit (INDEPENDENT_AMBULATORY_CARE_PROVIDER_SITE_OTHER): Admitting: Nurse Practitioner

## 2018-04-28 VITALS — BP 118/78 | HR 85 | Ht 60.0 in | Wt 148.4 lb

## 2018-04-28 DIAGNOSIS — I10 Essential (primary) hypertension: Secondary | ICD-10-CM | POA: Diagnosis not present

## 2018-04-28 DIAGNOSIS — R040 Epistaxis: Secondary | ICD-10-CM | POA: Diagnosis not present

## 2018-04-28 DIAGNOSIS — R7303 Prediabetes: Secondary | ICD-10-CM | POA: Diagnosis not present

## 2018-04-28 DIAGNOSIS — F419 Anxiety disorder, unspecified: Secondary | ICD-10-CM | POA: Diagnosis not present

## 2018-04-28 HISTORY — DX: Epistaxis: R04.0

## 2018-04-28 LAB — POCT UA - MICROALBUMIN: MICROALBUMIN (UR) POC: NORMAL mg/L

## 2018-04-28 LAB — POCT URINALYSIS DIPSTICK
Bilirubin, UA: NEGATIVE
Glucose, UA: NEGATIVE
Ketones, UA: NEGATIVE
Leukocytes, UA: NEGATIVE
Nitrite, UA: NEGATIVE
Protein, UA: NEGATIVE
RBC UA: NEGATIVE
Urobilinogen, UA: 0.2 E.U./dL
pH, UA: 6.5 (ref 5.0–8.0)

## 2018-04-28 MED ORDER — BUSPIRONE HCL 5 MG PO TABS: 5.0000 mg | ORAL_TABLET | Freq: Three times a day (TID) | ORAL | 1 refills | Status: DC

## 2018-04-28 MED ORDER — BLOOD GLUCOSE MONITOR KIT: PACK | 0 refills | Status: DC

## 2018-04-28 NOTE — Patient Instructions (Signed)
NORMAL SALINE NASAL SPRAY TO NOSE VASELINE TO INNER NOSE

## 2018-04-28 NOTE — Progress Notes (Signed)
Subjective:     Patient ID: [name] , female    DOB: 1960-05-01 , 58 y.o.   MRN: [id number]   Chief Complaint  Patient presents with  . Hypertension    follow up for htn , have some nose bleeds about 1 week ago , had a headache just before it started to bleed     HPI  Hypertension  This is a chronic problem. The current episode started more than 1 year ago. The problem is controlled. Pertinent negatives include no anxiety, blurred vision, chest pain, headaches or palpitations. There are no associated agents to hypertension. Risk factors for coronary artery disease include diabetes mellitus and sedentary lifestyle.  Diabetes  Pertinent negatives for hypoglycemia include no dizziness or headaches. Pertinent negatives for diabetes include no blurred vision, no chest pain, no fatigue, no polydipsia, no polyphagia and no polyuria.     Past Medical History:  Diagnosis Date  . Acute kidney failure with lesion of tubular necrosis ([place]) 11/11/2016  . Anxiety   . Back pain   . Diabetes mellitus   . Essential hypertension 11/11/2016  . Fibromyalgia   . High cholesterol   . Hypertension   . Syncope 11/11/2016     Family History  Problem Relation Age of Onset  . Hypertension Mother   . Heart failure Mother   . Diabetes Father   . COPD Father   . Emphysema Father   . Heart failure Maternal Grandmother   . Cancer Maternal Grandfather        LUNG   . Heart disease Maternal Grandfather   . Lung cancer Maternal Grandfather      Current Outpatient Medications:  .  albuterol (PROVENTIL HFA;VENTOLIN HFA) 108 (90 Base) MCG/ACT inhaler, Inhale 1 puff into the lungs every 6 (six) hours as needed for wheezing or shortness of breath., Disp: 1 Inhaler, Rfl: 0 .  alum & mag hydroxide-simeth (MAALOX/MYLANTA) 200-200-20 MG/5ML suspension, Take 15 mLs by mouth every 4 (four) hours as needed for indigestion or heartburn., Disp: 355 mL, Rfl: 0 .  aspirin 81 MG chewable tablet, Chew 81 mg by  mouth daily., Disp: , Rfl:  .  budesonide-formoterol (SYMBICORT) 80-4.5 MCG/ACT inhaler, Inhale 2 puffs into the lungs as needed., Disp: , Rfl:  .  busPIRone (BUSPAR) 5 MG tablet, Take 1 tablet (5 mg total) by mouth 2 (two) times daily., Disp: 60 tablet, Rfl: 1 .  cyclobenzaprine (FLEXERIL) 10 MG tablet, TAKE 1 TABLET BY MOUTH THREE TIMES DAILY, Disp: , Rfl:  .  DULoxetine (CYMBALTA) 60 MG capsule, TAKE 1 CAPSULE BY MOUTH ONCE DAILY, Disp: 90 capsule, Rfl: 1 .  gabapentin (NEURONTIN) 300 MG capsule, Take 1 capsule (300 mg total) by mouth 3 (three) times daily., Disp: 180 capsule, Rfl: 1 .  Melatonin 5 MG TABS, Take 1 tablet by mouth at bedtime., Disp: , Rfl:  .  metFORMIN (GLUCOPHAGE XR) 750 MG 24 hr tablet, Take 1 tablet (750 mg total) by mouth daily with breakfast., Disp: 30 tablet, Rfl: 2 .  olmesartan (BENICAR) 5 MG tablet, TAKE 1 TABLET BY MOUTH ONCE DAILY, Disp: 90 tablet, Rfl: 1 .  ondansetron (ZOFRAN) 4 MG tablet, Take 1 tablet (4 mg total) by mouth every 6 (six) hours as needed for nausea., Disp: 20 tablet, Rfl: 0 .  rosuvastatin (CRESTOR) 10 MG tablet, TAKE 1 TABLET BY MOUTH ONCE DAILY, Disp: 90 tablet, Rfl: 1 .  Tetrahydrozoline HCl (VISINE OP), Place 1 drop into both eyes daily., Disp: ,  Rfl:    Allergies  Allergen Reactions  . Tylenol [Acetaminophen] Itching  . Pollen Extract   . Shellfish Allergy Hives  . Peanut-Containing Drug Products Hives     Review of Systems  Constitutional: Negative for fatigue.  HENT: Positive for nosebleeds.   Eyes: Negative for blurred vision and photophobia.  Respiratory: Positive for cough (dry hacking cough). Negative for wheezing.   Cardiovascular: Negative.  Negative for chest pain, palpitations and leg swelling.  Gastrointestinal: Negative for diarrhea and nausea.  Endocrine: Negative for polydipsia, polyphagia and polyuria.  Neurological: Negative for dizziness and headaches.     Today's Vitals   04/28/18 1150  BP: 118/78  Pulse: 85   SpO2: 95%  Weight: 148 lb 6.4 oz (67.3 kg)  Height: 5' (1.524 m)   Body mass index is 28.98 kg/m.   Objective:  Physical Exam Vitals signs reviewed.  Constitutional:      Appearance: She is well-developed.  HENT:     Head: Normocephalic and atraumatic.  Eyes:     Pupils: Pupils are equal, round, and reactive to light.  Neck:     Musculoskeletal: Normal range of motion and neck supple.  Cardiovascular:     Rate and Rhythm: Normal rate and regular rhythm.     Pulses: Normal pulses.     Heart sounds: Normal heart sounds. No murmur.  Pulmonary:     Effort: Pulmonary effort is normal.     Breath sounds: Normal breath sounds.  Chest:     Chest wall: No tenderness.  Musculoskeletal: Normal range of motion.  Skin:    General: Skin is warm and dry.     Capillary Refill: Capillary refill takes less than 2 seconds.  Neurological:     General: No focal deficit present.     Mental Status: She is alert and oriented to person, place, and time.     Cranial Nerves: No cranial nerve deficit.  Psychiatric:        Mood and Affect: Mood normal.         Assessment And Plan:      1. Essential hypertension . B/P is controlled.  . CMP ordered to check renal function.  . The importance of regular exercise and dietary modification was stressed to the patient.  . Stressed importance of losing ten percent of her body weight to help with B/P control.  . The weight loss would help with decreasing cardiac and cancer risk as well.  - POCT Urinalysis Dipstick (81002) - POCT UA - Microalbumin - CMP14 + Anion Gap - TSH  2. Prediabetes  Chronic  Continue with current medications  Encouraged to limit intake of sugary foods and drinks  Encouraged to increase physical activity to 150 minutes per week  - blood glucose meter kit and supplies KIT; Dispense based on patient and insurance preference. Use up to four times daily as directed. (FOR ICD-9 250.00, 250.01).  Dispense: 1 each; Refill:  0 - Hemoglobin A1c  3. Anxiety  She is doing better currently and doing well however will feel more anxious in the middle of the day  Will increase the dose - busPIRone (BUSPAR) 5 MG tablet; Take 1 tablet (5 mg total) by mouth 3 (three) times daily.  Dispense: 270 tablet; Refill: 1  4. Epistaxis  Intermittent episodes of epistaxis   Advised to use a normal saline nasal spray   If worsens return call to office     [name], [name]

## 2018-04-29 LAB — CMP14 + ANION GAP
ALT: 24 IU/L (ref 0–32)
AST: 26 IU/L (ref 0–40)
Albumin/Globulin Ratio: 1.4 (ref 1.2–2.2)
Albumin: 4.4 g/dL (ref 3.8–4.9)
Alkaline Phosphatase: 125 IU/L — ABNORMAL HIGH (ref 39–117)
Anion Gap: 20 mmol/L — ABNORMAL HIGH (ref 10.0–18.0)
BUN/Creatinine Ratio: 13 (ref 9–23)
BUN: 10 mg/dL (ref 6–24)
Bilirubin Total: 0.2 mg/dL (ref 0.0–1.2)
CO2: 20 mmol/L (ref 20–29)
Calcium: 9.5 mg/dL (ref 8.7–10.2)
Chloride: 101 mmol/L (ref 96–106)
Creatinine, Ser: 0.79 mg/dL (ref 0.57–1.00)
GFR calc Af Amer: 95 mL/min/{1.73_m2} (ref >59)
GFR calc non Af Amer: 83 mL/min/{1.73_m2} (ref >59)
GLOBULIN, TOTAL: 3.1 g/dL (ref 1.5–4.5)
Glucose: 108 mg/dL — ABNORMAL HIGH (ref 65–99)
POTASSIUM: 4.4 mmol/L (ref 3.5–5.2)
Sodium: 141 mmol/L (ref 134–144)
Total Protein: 7.5 g/dL (ref 6.0–8.5)

## 2018-04-29 LAB — TSH: TSH: 0.823 u[IU]/mL (ref 0.450–4.500)

## 2018-04-29 LAB — HEMOGLOBIN A1C
Est. average glucose Bld gHb Est-mCnc: 160 mg/dL
Hgb A1c MFr Bld: 7.2 % — ABNORMAL HIGH (ref 4.8–5.6)

## 2018-05-19 ENCOUNTER — Other Ambulatory Visit: Payer: Self-pay | Admitting: Internal Medicine

## 2018-05-19 ENCOUNTER — Other Ambulatory Visit: Payer: Self-pay | Admitting: Nurse Practitioner

## 2018-06-08 ENCOUNTER — Other Ambulatory Visit (HOSPITAL_COMMUNITY): Payer: Self-pay | Admitting: *Deleted

## 2018-06-08 DIAGNOSIS — Z1231 Encounter for screening mammogram for malignant neoplasm of breast: Secondary | ICD-10-CM

## 2018-06-15 ENCOUNTER — Other Ambulatory Visit: Payer: Self-pay | Admitting: Internal Medicine

## 2018-06-18 ENCOUNTER — Other Ambulatory Visit: Payer: Self-pay | Admitting: Nurse Practitioner

## 2018-07-29 ENCOUNTER — Other Ambulatory Visit: Payer: Self-pay

## 2018-07-29 ENCOUNTER — Ambulatory Visit: Admitting: Nurse Practitioner

## 2018-07-29 ENCOUNTER — Ambulatory Visit (INDEPENDENT_AMBULATORY_CARE_PROVIDER_SITE_OTHER): Admitting: Nurse Practitioner

## 2018-07-29 ENCOUNTER — Encounter: Payer: Self-pay | Admitting: Nurse Practitioner

## 2018-07-29 VITALS — BP 104/70 | HR 104 | Temp 98.4°F | Ht 60.0 in | Wt 141.8 lb

## 2018-07-29 DIAGNOSIS — F419 Anxiety disorder, unspecified: Secondary | ICD-10-CM | POA: Diagnosis not present

## 2018-07-29 DIAGNOSIS — I10 Essential (primary) hypertension: Secondary | ICD-10-CM | POA: Diagnosis not present

## 2018-07-29 DIAGNOSIS — E119 Type 2 diabetes mellitus without complications: Secondary | ICD-10-CM | POA: Diagnosis not present

## 2018-07-29 DIAGNOSIS — E782 Mixed hyperlipidemia: Secondary | ICD-10-CM | POA: Diagnosis not present

## 2018-07-29 MED ORDER — ROSUVASTATIN CALCIUM 10 MG PO TABS: 10.0000 mg | ORAL_TABLET | Freq: Every day | ORAL | 1 refills | Status: DC

## 2018-07-29 MED ORDER — BLOOD GLUCOSE MONITOR KIT: PACK | 0 refills | Status: DC

## 2018-07-29 MED ORDER — BUSPIRONE HCL 10 MG PO TABS: 10.0000 mg | ORAL_TABLET | Freq: Two times a day (BID) | ORAL | 1 refills | Status: DC

## 2018-07-29 MED ORDER — DULOXETINE HCL 60 MG PO CPEP: 60.0000 mg | ORAL_CAPSULE | Freq: Every day | ORAL | 1 refills | Status: DC

## 2018-07-29 NOTE — Progress Notes (Addendum)
Subjective:     Patient ID: [name] , female    DOB: 1960/03/10 , 58 y.o.   MRN: [id number]   Chief Complaint  Patient presents with  . Hypertension    HPI  Hypertension  This is a chronic problem. The current episode started more than 1 year ago. The problem is controlled. Pertinent negatives include no anxiety, blurred vision, chest pain, headaches, palpitations or shortness of breath. There are no compliance problems.  There is no history of angina. There is no history of chronic renal disease.  Diabetes  She presents for her follow-up diabetic visit. She has type 2 diabetes mellitus. There are no hypoglycemic associated symptoms. Pertinent negatives for hypoglycemia include no confusion, dizziness, headaches or nervousness/anxiousness. Pertinent negatives for diabetes include no blurred vision, no chest pain, no fatigue and no weakness. There are no hypoglycemic complications. There are no diabetic complications. Risk factors for coronary artery disease include dyslipidemia, hypertension and sedentary lifestyle.     Past Medical History:  Diagnosis Date  . Acute kidney failure with lesion of tubular necrosis ([place]) 11/11/2016  . Anxiety   . Back pain   . Diabetes mellitus   . Essential hypertension 11/11/2016  . Fibromyalgia   . High cholesterol   . Hypertension   . Syncope 11/11/2016     Family History  Problem Relation Age of Onset  . Hypertension Mother   . Heart failure Mother   . Diabetes Father   . COPD Father   . Emphysema Father   . Heart failure Maternal Grandmother   . Cancer Maternal Grandfather        LUNG   . Heart disease Maternal Grandfather   . Lung cancer Maternal Grandfather      Current Outpatient Medications:  .  albuterol (VENTOLIN HFA) 108 (90 Base) MCG/ACT inhaler, INHALE 1 PUFF BY MOUTH EVERY 6 HOURS AS NEEDED FOR WHEEZING FOR SHORTNESS OF BREATH, Disp: 9 g, Rfl: 0 .  alum & mag hydroxide-simeth (MAALOX/MYLANTA) 200-200-20 MG/5ML  suspension, Take 15 mLs by mouth every 4 (four) hours as needed for indigestion or heartburn., Disp: 355 mL, Rfl: 0 .  aspirin 81 MG chewable tablet, Chew 81 mg by mouth daily., Disp: , Rfl:  .  budesonide-formoterol (SYMBICORT) 80-4.5 MCG/ACT inhaler, Inhale 2 puffs into the lungs as needed., Disp: , Rfl:  .  cyclobenzaprine (FLEXERIL) 10 MG tablet, TAKE 1 TABLET BY MOUTH THREE TIMES DAILY, Disp: 30 tablet, Rfl: 0 .  DULoxetine (CYMBALTA) 60 MG capsule, TAKE 1 CAPSULE BY MOUTH ONCE DAILY, Disp: 90 capsule, Rfl: 1 .  gabapentin (NEURONTIN) 300 MG capsule, Take 1 capsule (300 mg total) by mouth 3 (three) times daily., Disp: 180 capsule, Rfl: 1 .  Melatonin 5 MG TABS, Take 1 tablet by mouth at bedtime., Disp: , Rfl:  .  metFORMIN (GLUCOPHAGE-XR) 750 MG 24 hr tablet, Take 1 tablet by mouth once daily with breakfast, Disp: 90 tablet, Rfl: 0 .  olmesartan (BENICAR) 5 MG tablet, TAKE 1 TABLET BY MOUTH ONCE DAILY, Disp: 90 tablet, Rfl: 1 .  ondansetron (ZOFRAN) 4 MG tablet, Take 1 tablet (4 mg total) by mouth every 6 (six) hours as needed for nausea., Disp: 20 tablet, Rfl: 0 .  rosuvastatin (CRESTOR) 10 MG tablet, TAKE 1 TABLET BY MOUTH ONCE DAILY, Disp: 90 tablet, Rfl: 1 .  Tetrahydrozoline HCl (VISINE OP), Place 1 drop into both eyes daily., Disp: , Rfl:    Allergies  Allergen Reactions  . Tylenol [Acetaminophen] Itching  .  Pollen Extract   . Shellfish Allergy Hives  . Peanut-Containing Drug Products Hives     Review of Systems  Constitutional: Negative.  Negative for fatigue.  Eyes: Negative for blurred vision, photophobia and visual disturbance.  Respiratory: Negative.  Negative for shortness of breath.   Cardiovascular: Negative.  Negative for chest pain, palpitations and leg swelling.  Gastrointestinal: Negative.   Endocrine: Negative.   Musculoskeletal: Negative.   Skin: Negative.   Neurological: Negative for dizziness, weakness and headaches.  Psychiatric/Behavioral: Negative for  confusion. The patient is not nervous/anxious.      Today's Vitals   07/29/18 1110  BP: 104/70  Pulse: (!) 104  Temp: 98.4 F (36.9 C)  TempSrc: Oral  Weight: 141 lb 12.8 oz (64.3 kg)  Height: 5' (1.524 m)  PainSc: 4   PainLoc: Back   Body mass index is 27.69 kg/m.   Objective:  Physical Exam Constitutional:      Appearance: Normal appearance. She is well-developed.  Neck:     Musculoskeletal: Normal range of motion and neck supple.  Cardiovascular:     Rate and Rhythm: Normal rate and regular rhythm.     Pulses: Normal pulses.     Heart sounds: Normal heart sounds. No murmur.  Pulmonary:     Effort: Pulmonary effort is normal.     Breath sounds: Normal breath sounds.  Chest:     Chest wall: No tenderness.  Musculoskeletal: Normal range of motion.  Skin:    General: Skin is warm and dry.     Capillary Refill: Capillary refill takes less than 2 seconds.  Neurological:     General: No focal deficit present.     Mental Status: She is alert and oriented to person, place, and time.  Psychiatric:        Mood and Affect: Mood normal.        Behavior: Behavior normal.        Thought Content: Thought content normal.        Judgment: Judgment normal.         Assessment And Plan:     1. Type 2 diabetes mellitus without complication, without long-term current use of insulin (HCC)  Chronic, elevated at last visit   Continue with current medications  Encouraged to limit intake of sugary foods and drinks  Encouraged to increase physical activity to 150 minutes per week - blood glucose meter kit and supplies KIT; Dispense based on patient and insurance preference. Use up to four times daily as directed. (FOR ICD-9 250.00, 250.01).  Dispense: 1 each; Refill: 0 - CMP14 + Anion Gap - Lipid Profile  2. Essential hypertension  Chronic, good control  Continue with current medications - CMP14 + Anion Gap  3. Anxiety  Overall doing much better she is currently in  school and seems to be in good spirits - busPIRone (BUSPAR) 10 MG tablet; Take 1 tablet (10 mg total) by mouth 2 (two) times daily.  Dispense: 180 tablet; Refill: 1  4. Mixed hyperlipidemia  Chronic, controlled  No current medications - Hemoglobin A1c - Lipid Profile   [name], [name]    THE PATIENT IS ENCOURAGED TO PRACTICE SOCIAL DISTANCING DUE TO THE COVID-19 PANDEMIC.

## 2018-07-30 LAB — CMP14 + ANION GAP
ALT: 27 IU/L (ref 0–32)
AST: 32 IU/L (ref 0–40)
Albumin/Globulin Ratio: 1.6 (ref 1.2–2.2)
Albumin: 4.7 g/dL (ref 3.8–4.9)
Alkaline Phosphatase: 107 IU/L (ref 39–117)
Anion Gap: 18 mmol/L (ref 10.0–18.0)
BUN/Creatinine Ratio: 22 (ref 9–23)
BUN: 30 mg/dL — ABNORMAL HIGH (ref 6–24)
Bilirubin Total: 0.4 mg/dL (ref 0.0–1.2)
CO2: 19 mmol/L — ABNORMAL LOW (ref 20–29)
Calcium: 10 mg/dL (ref 8.7–10.2)
Chloride: 100 mmol/L (ref 96–106)
Creatinine, Ser: 1.38 mg/dL — ABNORMAL HIGH (ref 0.57–1.00)
GFR calc Af Amer: 49 mL/min/{1.73_m2} — ABNORMAL LOW (ref >59)
GFR calc non Af Amer: 42 mL/min/{1.73_m2} — ABNORMAL LOW (ref >59)
Globulin, Total: 2.9 g/dL (ref 1.5–4.5)
Glucose: 201 mg/dL — ABNORMAL HIGH (ref 65–99)
Potassium: 4.9 mmol/L (ref 3.5–5.2)
Sodium: 137 mmol/L (ref 134–144)
Total Protein: 7.6 g/dL (ref 6.0–8.5)

## 2018-07-30 LAB — HEMOGLOBIN A1C
Est. average glucose Bld gHb Est-mCnc: 180 mg/dL
Hgb A1c MFr Bld: 7.9 % — ABNORMAL HIGH (ref 4.8–5.6)

## 2018-07-30 LAB — LIPID PANEL
Chol/HDL Ratio: 3.7 ratio (ref 0.0–4.4)
Cholesterol, Total: 250 mg/dL — ABNORMAL HIGH (ref 100–199)
HDL: 67 mg/dL (ref >39)
LDL Calculated: 146 mg/dL — ABNORMAL HIGH (ref 0–99)
Triglycerides: 187 mg/dL — ABNORMAL HIGH (ref 0–149)
VLDL Cholesterol Cal: 37 mg/dL (ref 5–40)

## 2018-08-03 ENCOUNTER — Other Ambulatory Visit: Payer: Self-pay | Admitting: Nurse Practitioner

## 2018-08-03 DIAGNOSIS — E119 Type 2 diabetes mellitus without complications: Secondary | ICD-10-CM

## 2018-08-03 MED ORDER — SEMAGLUTIDE 7 MG PO TABS: 1.0000 | ORAL_TABLET | Freq: Every day | ORAL | 0 refills | Status: DC

## 2018-08-03 MED ORDER — ROSUVASTATIN CALCIUM 20 MG PO TABS: 10.0000 mg | ORAL_TABLET | Freq: Every day | ORAL | 1 refills | Status: DC

## 2018-08-03 MED FILL — ROSUVASTATIN CALCIUM 20 MG: 20 | 90 days supply | Qty: 45 | Fill #0

## 2018-08-03 MED FILL — RYBELSUS 7 MG TABS: 7 | 30 days supply | Qty: 30 | Fill #0

## 2018-08-19 ENCOUNTER — Telehealth: Payer: Self-pay

## 2018-08-19 ENCOUNTER — Other Ambulatory Visit: Payer: Self-pay

## 2018-08-19 DIAGNOSIS — E119 Type 2 diabetes mellitus without complications: Secondary | ICD-10-CM

## 2018-08-19 MED ORDER — RYBELSUS 7 MG PO TABS: 1.0000 | ORAL_TABLET | Freq: Every day | ORAL | 0 refills | Status: DC

## 2018-08-19 NOTE — Telephone Encounter (Signed)
Patient called stating her Rx for rybelsus 30mg  is not at the pharamacy.  RETURNED PT CALL TO NOTIFY HER WE HAVE SENT IT TO THE PHARMACY LEFT V/M FOR PT TO CALL OFFICE. Lonia Mad

## 2018-09-14 ENCOUNTER — Other Ambulatory Visit: Payer: Self-pay | Admitting: Nurse Practitioner

## 2018-09-14 NOTE — Telephone Encounter (Signed)
Cyclobenazeprine refill

## 2018-09-16 ENCOUNTER — Other Ambulatory Visit: Payer: Self-pay | Admitting: Nurse Practitioner

## 2018-10-14 ENCOUNTER — Other Ambulatory Visit: Payer: Self-pay

## 2018-10-14 ENCOUNTER — Ambulatory Visit
Admission: RE | Admit: 2018-10-14 | Discharge: 2018-10-14 | Disposition: A | Discharge status: Home / Self Care | Source: Ambulatory Visit | Attending: Obstetrics and Gynecology | Admitting: Obstetrics and Gynecology

## 2018-10-14 ENCOUNTER — Encounter (HOSPITAL_COMMUNITY): Payer: Self-pay

## 2018-10-14 ENCOUNTER — Ambulatory Visit (HOSPITAL_COMMUNITY)
Admission: RE | Admit: 2018-10-14 | Discharge: 2018-10-14 | Disposition: A | Discharge status: Home / Self Care | Source: Ambulatory Visit | Attending: Obstetrics and Gynecology | Admitting: Obstetrics and Gynecology

## 2018-10-14 DIAGNOSIS — Z1239 Encounter for other screening for malignant neoplasm of breast: Secondary | ICD-10-CM | POA: Insufficient documentation

## 2018-10-14 DIAGNOSIS — Z1231 Encounter for screening mammogram for malignant neoplasm of breast: Secondary | ICD-10-CM

## 2018-10-14 NOTE — Progress Notes (Signed)
No complaints today.   Pap Smear: Pap smear not completed today. Last Pap smear was 03/07/2013 at [facility] and normal. Per patient has no history of an abnormal Pap smear. Patient has a history of a hysterectomy 02/22/2007 due to fibroids and endometriosis. Patient doesn't need any further Pap smears due to her history of a hysterectomy for benign reasons per BCCCP and ACOG guidelines. Last Pap smear result is in Epic.  Physical exam: Breasts Right breast slightly larger than the left breast that per patient is normal for her. No skin abnormalities bilateral breasts. No nipple retraction bilateral breasts. No nipple discharge bilateral breasts. No lymphadenopathy. No lumps palpated bilateral breasts. No complaints of pain or tenderness on exam. Referred patient to the [facility] for a screening mammogram. Appointment scheduled for Thursday, October 14, 2018 at 1150.        Pelvic/Bimanual No Pap smear completed today since patient has a history of a hysterectomy for benign reasons. Pap smear not indicated per BCCCP guidelines.   Smoking History: Patient is a current smoker. Referred patient to the [facility] for a screening mammogram. Appointment scheduled for Thursday, October 14, 2018 at 1150.   Patient Navigation: Patient education provided. Access to services provided for patient through [facility] program.   Colorectal Cancer Screening: Per patient had a colonoscopy completed in 2016. No complaints today.   Breast and Cervical Cancer Risk Assessment: Patient has no family history of breast cancer, known genetic mutations, or radiation treatment to the chest before age 37. Patient has no history of cervical dysplasia, immunocompromised, or DES exposure in-utero.  Risk Assessment    Risk Scores      10/14/2018   Last edited by: Armond Hang, LPN   5-year risk: 1.4 %   Lifetime risk: 7.3 %

## 2018-10-14 NOTE — Patient Instructions (Addendum)
Explained breast self awareness with [name]. Patient did not need a Pap smear today due to patient has a history of a hysterectomy for benign reasons. Let patient know that she doesn't need any further Pap smears due to her history of a hysterectomy for benign reasons. Referred patient to the [facility] for a screening mammogram. Appointment scheduled for Thursday, October 14, 2018 at 1150. Patient aware of appointment and will be there. [facility] next couple weeks with results of mammogram by letter or phone. Referred patient to the [facility] for a screening mammogram. Appointment scheduled for Thursday, October 14, 2018 at 1150. Ambry E Rhome verbalized understanding.  Graycie Halley, Arvil Chaco, RN 12:45 PM

## 2018-10-19 ENCOUNTER — Encounter (HOSPITAL_COMMUNITY): Payer: Self-pay | Admitting: *Deleted

## 2018-10-19 ENCOUNTER — Telehealth: Payer: Self-pay

## 2018-10-19 NOTE — Telephone Encounter (Signed)
Patient called stating she is not feeling well and would like to be tested for covid.   I HAVE ATTEMPTED TO CALL BOT PATIENT'S HOME AND CELL NUMBER I HAVE LEFT HER A V/M ON HER CELL TO CALL THE OFFICE TO SCHEDULE HER VIRTUAL APPOINTMENT. [name]

## 2018-10-27 ENCOUNTER — Ambulatory Visit (HOSPITAL_COMMUNITY)
Admission: EM | Admit: 2018-10-27 | Discharge: 2018-10-27 | Disposition: A | Discharge status: Home / Self Care | Attending: Family Medicine | Admitting: Family Medicine

## 2018-10-27 ENCOUNTER — Encounter (HOSPITAL_COMMUNITY): Payer: Self-pay

## 2018-10-27 ENCOUNTER — Other Ambulatory Visit: Payer: Self-pay

## 2018-10-27 ENCOUNTER — Ambulatory Visit (INDEPENDENT_AMBULATORY_CARE_PROVIDER_SITE_OTHER)

## 2018-10-27 DIAGNOSIS — R0789 Other chest pain: Secondary | ICD-10-CM

## 2018-10-27 DIAGNOSIS — M797 Fibromyalgia: Secondary | ICD-10-CM | POA: Insufficient documentation

## 2018-10-27 DIAGNOSIS — I700 Atherosclerosis of aorta: Secondary | ICD-10-CM | POA: Insufficient documentation

## 2018-10-27 DIAGNOSIS — R05 Cough: Secondary | ICD-10-CM

## 2018-10-27 DIAGNOSIS — R062 Wheezing: Secondary | ICD-10-CM

## 2018-10-27 DIAGNOSIS — F17210 Nicotine dependence, cigarettes, uncomplicated: Secondary | ICD-10-CM | POA: Diagnosis not present

## 2018-10-27 DIAGNOSIS — R0602 Shortness of breath: Secondary | ICD-10-CM | POA: Diagnosis not present

## 2018-10-27 DIAGNOSIS — E119 Type 2 diabetes mellitus without complications: Secondary | ICD-10-CM | POA: Insufficient documentation

## 2018-10-27 DIAGNOSIS — I10 Essential (primary) hypertension: Secondary | ICD-10-CM

## 2018-10-27 DIAGNOSIS — E7800 Pure hypercholesterolemia, unspecified: Secondary | ICD-10-CM | POA: Insufficient documentation

## 2018-10-27 DIAGNOSIS — Z20828 Contact with and (suspected) exposure to other viral communicable diseases: Secondary | ICD-10-CM | POA: Insufficient documentation

## 2018-10-27 DIAGNOSIS — R059 Cough, unspecified: Secondary | ICD-10-CM

## 2018-10-27 MED ORDER — AZITHROMYCIN 250 MG PO TABS: 250.0000 mg | ORAL_TABLET | Freq: Every day | ORAL | 0 refills | Status: DC

## 2018-10-27 MED ORDER — PREDNISONE 10 MG (21) PO TBPK: ORAL_TABLET | Freq: Every day | ORAL | 0 refills | Status: DC

## 2018-10-27 NOTE — ED Triage Notes (Signed)
Pt states she has cough so much her chest is sore. X 3 weeks.

## 2018-10-27 NOTE — Discharge Instructions (Addendum)
Monitor your blood sugars closely. While taking prednisone, your blood sugars will be higher than normal.

## 2018-10-27 NOTE — ED Provider Notes (Signed)
[facility]   [id number] 10/27/18 Arrival Time: 1102  ASSESSMENT & PLAN:  1. Cough   2. SOB (shortness of breath)   3. Nicotine dependence, cigarettes, uncomplicated   4. Essential hypertension   5. Wheezing     COVID-19 testing sent. To self-quarantine until results are available.  No respiratory distress. I have personally viewed the imaging studies ordered this visit. No acute cardiopulmonary disease appreciated.  See AVS for discharge instructions.  Given persistent respiratory symptoms will begin: Meds ordered this encounter  Medications  . azithromycin (ZITHROMAX) 250 MG tablet    Sig: Take 1 tablet (250 mg total) by mouth daily. Take first 2 tablets together, then 1 every day until finished.    Dispense:  6 tablet    Refill:  0  . predniSONE (STERAPRED UNI-PAK 21 TAB) 10 MG (21) TBPK tablet    Sig: Take by mouth daily. Take as directed.    Dispense:  21 tablet    Refill:  0   OTC symptom care as needed. Ensure adequate fluid intake and rest.  Three plus minutes of smoking cessation counseling done. Patient co-morbidities include HTN, DM, hypercholesterolemia. Risks of smoking reviewed including possible lung disease (COPD, cancer) and cardiac disease.  Benefits of smoking cessation also presented. Currently in pre-contemplative stage with no desire to quit at this time. Encourage her to think about this. Also encouraged her to follow-up with a primary care provider should she desire to quit.  Recommend: Follow-up Information    Arnette Felts, [name].   Specialty: General Practice Why: As needed. Contact information: [address] [place] [address] [phone]          Reviewed expectations re: course of current medical issues. Questions answered. Outlined signs and symptoms indicating need for more acute intervention. Patient verbalized understanding. After Visit Summary given.   SUBJECTIVE: History from: patient.  [name] is a 59 y.o. female who presents with complaint of a fairly persistent dry cough with associated chest soreness. Cough is affecting sleep at times. No nasal congestion or sinus symptoms. Mild occasional body aches. Does describe sporadic feeling of being short of breath, esp with coughing spells. Questions wheezing at times; none currently. No fevers reported. Overall normal PO intake without n/v. No abdominal or back pain. Known sick contacts: no. No specific or significant aggravating or alleviating factors reported. Able to perform daily activities without difficulty. No LE edema. No rashes. OTC treatment: none reported.  Social History   Tobacco Use  Smoking Status Current Every Day Smoker  . Packs/day: 0.30  . Types: Cigarettes  Smokeless Tobacco Never Used   Continues to smoke cigarettes daily. Has not tried to quit recently.   Increased blood pressure noted today. Reports that she has been treated for hypertension in the past. Not currently taking medication for  She reports no chest pain on exertion, no dyspnea on exertion, no swelling of ankles, no orthostatic dizziness or lightheadedness, no orthopnea or paroxysmal nocturnal dyspnea, no palpitations and no intermittent claudication symptoms.  ROS: As per HPI. All other systems negative.    OBJECTIVE:  Vitals:   10/27/18 1117 10/27/18 1120  BP: (!) 154/63   Pulse: 98   Resp: 18   Temp: 98.4 F (36.9 C)   TempSrc: Oral   SpO2: 99%   Weight:  66.7 kg    General appearance: alert; no distress HEENT: mild nasal congestion; mild throat irritation; oropharynx is moist Neck: supple without LAD CV: RRR  Lungs: unlabored respirations, symmetrical air entry with mild bilateral expiratory wheezing; cough: mild and dry; able to speak full sentences without difficulty Abd: soft; non-tender Ext: no LE edema Skin: warm and dry Neuro: normal gait Psychological: alert and cooperative; normal mood and affect  Imaging:  Dg Chest 2 View  Result Date: 10/27/2018 CLINICAL DATA:  Cough, chest pain, shortness of breath. EXAM: CHEST - 2 VIEW COMPARISON:  Radiographs of Jul 19, 2017. FINDINGS: The heart size and mediastinal contours are within normal limits. Both lungs are clear. No pneumothorax or pleural effusion is noted. Atherosclerosis of thoracic aorta is noted. The visualized skeletal structures are unremarkable. IMPRESSION: No active cardiopulmonary disease. Aortic Atherosclerosis (ICD10-I70.0). Electronically Signed   By: Marijo Conception M.D.   On: 10/27/2018 11:59    Allergies  Allergen Reactions  . Tylenol [Acetaminophen] Itching  . Pollen Extract   . Shellfish Allergy Hives  . Peanut-Containing Drug Products Hives    Past Medical History:  Diagnosis Date  . Acute kidney failure with lesion of tubular necrosis ([place]) 11/11/2016  . Anxiety   . Back pain   . Diabetes mellitus   . Essential hypertension 11/11/2016  . Fibromyalgia   . High cholesterol   . Hypertension   . Syncope 11/11/2016   Family History  Problem Relation Age of Onset  . Hypertension Mother   . Heart failure Mother   . Diabetes Father   . COPD Father   . Emphysema Father   . Heart failure Maternal Grandmother   . Cancer Maternal Grandfather        LUNG   . Heart disease Maternal Grandfather   . Lung cancer Maternal Grandfather    Social History   Socioeconomic History  . Marital status: Married    Spouse name: Not on file  . Number of children: 4  . Years of education: Not on file  . Highest education level: Some college, no degree  Occupational History  . Not on file  Social Needs  . Financial resource strain: Not on file  . Food insecurity    Worry: Not on file    Inability: Not on file  . Transportation needs    Medical: Yes    Non-medical: Yes  Tobacco Use  . Smoking status: Current Every Day Smoker    Packs/day: 0.30    Types: Cigarettes  . Smokeless tobacco: Never Used  Substance and Sexual Activity   . Alcohol use: Yes    Alcohol/week: 0.0 standard drinks    Comment: rare  . Drug use: No  . Sexual activity: Yes    Birth control/protection: Surgical    Comment: HYST-1st intercourse 58 yo--Fewer than 5 partners  Lifestyle  . Physical activity    Days per week: Not on file    Minutes per session: Not on file  . Stress: Not on file  Relationships  . Social Herbalist on phone: Not on file    Gets together: Not on file    Attends religious service: Not on file    Active member of club or organization: Not on file    Attends meetings of clubs or organizations: Not on file    Relationship status: Not on file  . Intimate partner violence    Fear of current or ex partner: Not on file    Emotionally abused: Not on file    Physically abused: Not on file    Forced sexual activity: Not on file  Other Topics  Concern  . Not on file  Social History Narrative  . Not on file           Mardella LaymanHagler, Lexa Coronado, MD 10/28/18 1059

## 2018-10-29 LAB — NOVEL CORONAVIRUS, NAA (HOSP ORDER, SEND-OUT TO REF LAB; TAT 18-24 HRS): SARS-CoV-2, NAA: NOT DETECTED

## 2018-11-03 ENCOUNTER — Other Ambulatory Visit: Payer: Self-pay | Admitting: Nurse Practitioner

## 2018-11-04 ENCOUNTER — Other Ambulatory Visit: Payer: Self-pay

## 2018-11-04 ENCOUNTER — Ambulatory Visit (INDEPENDENT_AMBULATORY_CARE_PROVIDER_SITE_OTHER): Admitting: Nurse Practitioner

## 2018-11-04 ENCOUNTER — Encounter: Payer: Self-pay | Admitting: Nurse Practitioner

## 2018-11-04 VITALS — BP 120/84 | HR 79 | Temp 98.1°F | Ht 60.2 in | Wt 144.4 lb

## 2018-11-04 DIAGNOSIS — I10 Essential (primary) hypertension: Secondary | ICD-10-CM | POA: Diagnosis not present

## 2018-11-04 DIAGNOSIS — E782 Mixed hyperlipidemia: Secondary | ICD-10-CM

## 2018-11-04 DIAGNOSIS — E119 Type 2 diabetes mellitus without complications: Secondary | ICD-10-CM

## 2018-11-04 DIAGNOSIS — Z09 Encounter for follow-up examination after completed treatment for conditions other than malignant neoplasm: Secondary | ICD-10-CM

## 2018-11-04 DIAGNOSIS — R0602 Shortness of breath: Secondary | ICD-10-CM | POA: Diagnosis not present

## 2018-11-04 DIAGNOSIS — R05 Cough: Secondary | ICD-10-CM | POA: Diagnosis not present

## 2018-11-04 DIAGNOSIS — R059 Cough, unspecified: Secondary | ICD-10-CM

## 2018-11-04 DIAGNOSIS — Z720 Tobacco use: Secondary | ICD-10-CM

## 2018-11-04 MED ORDER — ROSUVASTATIN CALCIUM 20 MG PO TABS: 20.0000 mg | ORAL_TABLET | Freq: Every day | ORAL | 1 refills | Status: DC

## 2018-11-04 MED ORDER — RYBELSUS 14 MG PO TABS: 1.0000 | ORAL_TABLET | Freq: Every day | ORAL | 3 refills | Status: DC

## 2018-11-04 MED ORDER — HYDROCODONE-HOMATROPINE 5-1.5 MG/5ML PO SYRP: 5.0000 mL | ORAL_SOLUTION | Freq: Four times a day (QID) | ORAL | 0 refills | Status: DC | PRN

## 2018-11-04 NOTE — Progress Notes (Signed)
Subjective:     Patient ID: [name] , female    DOB: [birth year] , 59 y.o.   MRN: [id number]   Chief Complaint  Patient presents with  . Hypertension  . Follow-up    patient went to the urgent care on 10/27/18. patient stated she is still coughing and having SOB    HPI  Hypertension This is a chronic problem. The current episode started more than 1 year ago. The problem is unchanged. The problem is controlled. Associated symptoms include shortness of breath. Pertinent negatives include no anxiety, chest pain or headaches. Risk factors for coronary artery disease include obesity and sedentary lifestyle.  Cough This is a recurrent problem. The current episode started 1 to 4 weeks ago. The problem has been gradually improving. The problem occurs constantly. The cough is non-productive. Associated symptoms include shortness of breath and wheezing. Pertinent negatives include no chest pain, ear pain, headaches or sore throat. She has tried a beta-agonist inhaler (using twice a day proair, recent visit to urgent care and treated with prednisone and azithromycin) for the symptoms. The treatment provided no relief. Her past medical history is significant for COPD. There is no history of emphysema.     Past Medical History:  Diagnosis Date  . Acute kidney failure with lesion of tubular necrosis ([place]) 11/11/2016  . Anxiety   . Back pain   . Diabetes mellitus   . Essential hypertension 11/11/2016  . Fibromyalgia   . High cholesterol   . Hypertension   . Syncope 11/11/2016     Family History  Problem Relation Age of Onset  . Hypertension Mother   . Heart failure Mother   . Diabetes Father   . COPD Father   . Emphysema Father   . Heart failure Maternal Grandmother   . Cancer Maternal Grandfather        LUNG   . Heart disease Maternal Grandfather   . Lung cancer Maternal Grandfather      Current Outpatient Medications:  .  albuterol (VENTOLIN HFA) 108 (90 Base) MCG/ACT  inhaler, INHALE 1 PUFF BY MOUTH EVERY 6 HOURS AS NEEDED FOR WHEEZING OR SHORTNESS OF BREATH, Disp: 9 g, Rfl: 0 .  alum & mag hydroxide-simeth (MAALOX/MYLANTA) 200-200-20 MG/5ML suspension, Take 15 mLs by mouth every 4 (four) hours as needed for indigestion or heartburn., Disp: 355 mL, Rfl: 0 .  aspirin 81 MG chewable tablet, Chew 81 mg by mouth daily., Disp: , Rfl:  .  budesonide-formoterol (SYMBICORT) 80-4.5 MCG/ACT inhaler, Inhale 2 puffs into the lungs as needed., Disp: , Rfl:  .  busPIRone (BUSPAR) 10 MG tablet, Take 1 tablet (10 mg total) by mouth 2 (two) times daily., Disp: 180 tablet, Rfl: 1 .  cyclobenzaprine (FLEXERIL) 10 MG tablet, TAKE 1 TABLET BY MOUTH THREE TIMES DAILY, Disp: 30 tablet, Rfl: 0 .  DULoxetine (CYMBALTA) 60 MG capsule, Take 1 capsule (60 mg total) by mouth daily., Disp: 90 capsule, Rfl: 1 .  gabapentin (NEURONTIN) 300 MG capsule, Take 1 capsule (300 mg total) by mouth 3 (three) times daily., Disp: 180 capsule, Rfl: 1 .  Melatonin 5 MG TABS, Take 1 tablet by mouth as needed. , Disp: , Rfl:  .  metFORMIN (GLUCOPHAGE-XR) 750 MG 24 hr tablet, Take 1 tablet by mouth once daily with breakfast, Disp: 90 tablet, Rfl: 0 .  olmesartan (BENICAR) 5 MG tablet, TAKE 1 TABLET BY MOUTH ONCE DAILY, Disp: 90 tablet, Rfl: 1 .  rosuvastatin (CRESTOR) 20 MG tablet, Take  0.5 tablets (10 mg total) by mouth daily., Disp: 90 tablet, Rfl: 1 .  Semaglutide (RYBELSUS) 7 MG TABS, Take 1 tablet by mouth daily., Disp: 30 tablet, Rfl: 0 .  Tetrahydrozoline HCl (VISINE OP), Place 1 drop into both eyes daily., Disp: , Rfl:  .  blood glucose meter kit and supplies KIT, Dispense based on patient and insurance preference. Use up to four times daily as directed. (FOR ICD-9 250.00, 250.01). (Patient not taking: Reported on 11/04/2018), Disp: 1 each, Rfl: 0 .  ondansetron (ZOFRAN) 4 MG tablet, Take 1 tablet (4 mg total) by mouth every 6 (six) hours as needed for nausea. (Patient not taking: Reported on 10/14/2018),  Disp: 20 tablet, Rfl: 0   Allergies  Allergen Reactions  . Tylenol [Acetaminophen] Itching  . Pollen Extract   . Shellfish Allergy Hives  . Peanut-Containing Drug Products Hives     Review of Systems  HENT: Negative for ear pain and sore throat.   Respiratory: Positive for cough, shortness of breath and wheezing.   Cardiovascular: Negative for chest pain.  Neurological: Negative for headaches.     Today's Vitals   11/04/18 1055 11/04/18 1056  BP:  120/84  Pulse:  79  Temp:  98.1 F (36.7 C)  TempSrc:  Oral  Weight:  144 lb 6.4 oz (65.5 kg)  Height:  5' 0.2" (1.529 m)  PainSc: 0-No pain 0-No pain   Body mass index is 28.01 kg/m.   Objective:  Physical Exam Vitals signs reviewed.  Constitutional:      Appearance: Normal appearance.  Cardiovascular:     Rate and Rhythm: Normal rate and regular rhythm.     Pulses: Normal pulses.     Heart sounds: Normal heart sounds.  Pulmonary:     Effort: Pulmonary effort is normal. No respiratory distress.     Breath sounds: No rhonchi.     Comments: Course breath sounds throughout Neurological:     General: No focal deficit present.     Mental Status: She is alert and oriented to person, place, and time.  Psychiatric:        Mood and Affect: Mood normal.        Behavior: Behavior normal.        Thought Content: Thought content normal.        Judgment: Judgment normal.         Assessment And Plan:     1. Type 2 diabetes mellitus without complication, without long-term current use of insulin (HCC)  Chronic, poorly controlled  Continue with current medications  Encouraged to limit intake of sugary foods and drinks - Hemoglobin A1c; Future - CMP14 + Anion Gap  2. Essential hypertension . B/P is controlled.  . CMP ordered to check renal function.  . The importance of regular exercise and dietary modification was stressed to the patient.  . Stressed importance of losing ten percent of her body weight to help with B/P  control.  . The weight loss would help with decreasing cardiac and cancer risk as well.   3. Mixed hyperlipidemia  Chronic, controlled  Continue with current medications - Lipid Profile  4. Tobacco abuse  I have discussed smoking cessation  5. Cough  Seen at urgent care negative for coronavirus and CXR negative.  Will provide limited amount of hydromet, advised to avoid driving when taking this medication. PDMP reviewed - HYDROcodone-homatropine (HYDROMET) 5-1.5 MG/5ML syrup; Take 5 mLs by mouth every 6 (six) hours as needed.  Dispense: 120 mL;   Refill: 0   [name], [name]    THE PATIENT IS ENCOURAGED TO PRACTICE SOCIAL DISTANCING DUE TO THE COVID-19 PANDEMIC.

## 2018-11-05 LAB — CMP14 + ANION GAP
ALT: 26 IU/L (ref 0–32)
AST: 22 IU/L (ref 0–40)
Albumin/Globulin Ratio: 1.6 (ref 1.2–2.2)
Albumin: 4.1 g/dL (ref 3.8–4.9)
Alkaline Phosphatase: 73 IU/L (ref 39–117)
Anion Gap: 12 mmol/L (ref 10.0–18.0)
BUN/Creatinine Ratio: 20 (ref 9–23)
BUN: 17 mg/dL (ref 6–24)
Bilirubin Total: <0.2 mg/dL (ref 0.0–1.2)
CO2: 27 mmol/L (ref 20–29)
Calcium: 9.5 mg/dL (ref 8.7–10.2)
Chloride: 102 mmol/L (ref 96–106)
Creatinine, Ser: 0.87 mg/dL (ref 0.57–1.00)
GFR calc Af Amer: 85 mL/min/{1.73_m2} (ref >59)
GFR calc non Af Amer: 74 mL/min/{1.73_m2} (ref >59)
Globulin, Total: 2.6 g/dL (ref 1.5–4.5)
Glucose: 105 mg/dL — ABNORMAL HIGH (ref 65–99)
Potassium: 4.5 mmol/L (ref 3.5–5.2)
Sodium: 141 mmol/L (ref 134–144)
Total Protein: 6.7 g/dL (ref 6.0–8.5)

## 2018-11-05 LAB — LIPID PANEL
Chol/HDL Ratio: 1.7 ratio (ref 0.0–4.4)
Cholesterol, Total: 134 mg/dL (ref 100–199)
HDL: 80 mg/dL (ref >39)
LDL Chol Calc (NIH): 35 mg/dL (ref 0–99)
Triglycerides: 105 mg/dL (ref 0–149)
VLDL Cholesterol Cal: 19 mg/dL (ref 5–40)

## 2018-11-08 ENCOUNTER — Ambulatory Visit

## 2018-12-18 ENCOUNTER — Other Ambulatory Visit: Payer: Self-pay | Admitting: Nurse Practitioner

## 2018-12-18 ENCOUNTER — Other Ambulatory Visit: Payer: Self-pay | Admitting: Internal Medicine

## 2018-12-20 NOTE — Telephone Encounter (Signed)
Cyclobenzaprine refill 

## 2019-01-31 ENCOUNTER — Other Ambulatory Visit: Payer: Self-pay

## 2019-01-31 MED ORDER — GABAPENTIN 300 MG PO CAPS: 300.0000 mg | ORAL_CAPSULE | Freq: Three times a day (TID) | ORAL | 1 refills | Status: DC

## 2019-02-22 ENCOUNTER — Ambulatory Visit (INDEPENDENT_AMBULATORY_CARE_PROVIDER_SITE_OTHER): Admitting: Nurse Practitioner

## 2019-02-22 ENCOUNTER — Encounter: Payer: Self-pay | Admitting: Nurse Practitioner

## 2019-02-22 ENCOUNTER — Other Ambulatory Visit: Payer: Self-pay

## 2019-02-22 VITALS — BP 132/90 | HR 82 | Temp 98.5°F | Ht 59.8 in | Wt 135.2 lb

## 2019-02-22 DIAGNOSIS — E119 Type 2 diabetes mellitus without complications: Secondary | ICD-10-CM

## 2019-02-22 DIAGNOSIS — I10 Essential (primary) hypertension: Secondary | ICD-10-CM

## 2019-02-22 DIAGNOSIS — Z0000 Encounter for general adult medical examination without abnormal findings: Secondary | ICD-10-CM | POA: Diagnosis not present

## 2019-02-22 DIAGNOSIS — M25511 Pain in right shoulder: Secondary | ICD-10-CM | POA: Diagnosis not present

## 2019-02-22 DIAGNOSIS — E782 Mixed hyperlipidemia: Secondary | ICD-10-CM

## 2019-02-22 DIAGNOSIS — Z720 Tobacco use: Secondary | ICD-10-CM | POA: Diagnosis not present

## 2019-02-22 DIAGNOSIS — Z23 Encounter for immunization: Secondary | ICD-10-CM

## 2019-02-22 MED ORDER — KETOROLAC TROMETHAMINE 60 MG/2ML IM SOLN
60.0000 mg | Freq: Once | INTRAMUSCULAR | Status: AC
  Administered 2019-02-22: 11:00:00 60 mg via INTRAMUSCULAR

## 2019-02-22 MED ORDER — SITAGLIPTIN PHOSPHATE 25 MG PO TABS: 25.0000 mg | ORAL_TABLET | Freq: Every day | ORAL | 2 refills | Status: DC

## 2019-02-22 NOTE — Patient Instructions (Signed)
Health Maintenance, Female Adopting a healthy lifestyle and getting preventive care are important in promoting health and wellness. Ask your health care provider about:  The right schedule for you to have regular tests and exams.  Things you can do on your own to prevent diseases and keep yourself healthy. What should I know about diet, weight, and exercise? Eat a healthy diet   Eat a diet that includes plenty of vegetables, fruits, low-fat dairy products, and lean protein.  Do not eat a lot of foods that are high in solid fats, added sugars, or sodium. Maintain a healthy weight Body mass index (BMI) is used to identify weight problems. It estimates body fat based on height and weight. Your health care provider can help determine your BMI and help you achieve or maintain a healthy weight. Get regular exercise Get regular exercise. This is one of the most important things you can do for your health. Most adults should:  Exercise for at least 150 minutes each week. The exercise should increase your heart rate and make you sweat (moderate-intensity exercise).  Do strengthening exercises at least twice a week. This is in addition to the moderate-intensity exercise.  Spend less time sitting. Even light physical activity can be beneficial. Watch cholesterol and blood lipids Have your blood tested for lipids and cholesterol at 59 years of age, then have this test every 5 years. Have your cholesterol levels checked more often if:  Your lipid or cholesterol levels are high.  You are older than 59 years of age.  You are at high risk for heart disease. What should I know about cancer screening? Depending on your health history and family history, you may need to have cancer screening at various ages. This may include screening for:  Breast cancer.  Cervical cancer.  Colorectal cancer.  Skin cancer.  Lung cancer. What should I know about heart disease, diabetes, and high blood  pressure? Blood pressure and heart disease  High blood pressure causes heart disease and increases the risk of stroke. This is more likely to develop in people who have high blood pressure readings, are of African descent, or are overweight.  Have your blood pressure checked: ? Every 3-5 years if you are 18-39 years of age. ? Every year if you are 40 years old or older. Diabetes Have regular diabetes screenings. This checks your fasting blood sugar level. Have the screening done:  Once every three years after age 40 if you are at a normal weight and have a low risk for diabetes.  More often and at a younger age if you are overweight or have a high risk for diabetes. What should I know about preventing infection? Hepatitis B If you have a higher risk for hepatitis B, you should be screened for this virus. Talk with your health care provider to find out if you are at risk for hepatitis B infection. Hepatitis C Testing is recommended for:  Everyone born from 1945 through 1965.  Anyone with known risk factors for hepatitis C. Sexually transmitted infections (STIs)  Get screened for STIs, including gonorrhea and chlamydia, if: ? You are sexually active and are younger than 59 years of age. ? You are older than 59 years of age and your health care provider tells you that you are at risk for this type of infection. ? Your sexual activity has changed since you were last screened, and you are at increased risk for chlamydia or gonorrhea. Ask your health care provider if   you are at risk.  Ask your health care provider about whether you are at high risk for HIV. Your health care provider may recommend a prescription medicine to help prevent HIV infection. If you choose to take medicine to prevent HIV, you should first get tested for HIV. You should then be tested every 3 months for as long as you are taking the medicine. Pregnancy  If you are about to stop having your period (premenopausal) and  you may become pregnant, seek counseling before you get pregnant.  Take 400 to 800 micrograms (mcg) of folic acid every day if you become pregnant.  Ask for birth control (contraception) if you want to prevent pregnancy. Osteoporosis and menopause Osteoporosis is a disease in which the bones lose minerals and strength with aging. This can result in bone fractures. If you are 65 years old or older, or if you are at risk for osteoporosis and fractures, ask your health care provider if you should:  Be screened for bone loss.  Take a calcium or vitamin D supplement to lower your risk of fractures.  Be given hormone replacement therapy (HRT) to treat symptoms of menopause. Follow these instructions at home: Lifestyle  Do not use any products that contain nicotine or tobacco, such as cigarettes, e-cigarettes, and chewing tobacco. If you need help quitting, ask your health care provider.  Do not use street drugs.  Do not share needles.  Ask your health care provider for help if you need support or information about quitting drugs. Alcohol use  Do not drink alcohol if: ? Your health care provider tells you not to drink. ? You are pregnant, may be pregnant, or are planning to become pregnant.  If you drink alcohol: ? Limit how much you use to 0-1 drink a day. ? Limit intake if you are breastfeeding.  Be aware of how much alcohol is in your drink. In the [place] one drink equals one 12 oz bottle of beer (355 mL), one 5 oz glass of wine (148 mL), or one 1 oz glass of hard liquor (44 mL). General instructions  Schedule regular health, dental, and eye exams.  Stay current with your vaccines.  Tell your health care provider if: ? You often feel depressed. ? You have ever been abused or do not feel safe at home. Summary  Adopting a healthy lifestyle and getting preventive care are important in promoting health and wellness.  Follow your health care provider's instructions about healthy  diet, exercising, and getting tested or screened for diseases.  Follow your health care provider's instructions on monitoring your cholesterol and blood pressure. This information is not intended to replace advice given to you by your health care provider. Make sure you discuss any questions you have with your health care provider. Document Released: 08/26/2010 Document Revised: 02/03/2018 Document Reviewed: 02/03/2018 [employer]vier Patient Education  2020 [employer]  

## 2019-02-22 NOTE — Progress Notes (Signed)
This visit occurred during the SARS-CoV-2 public health emergency.  Safety protocols were in place, including screening questions prior to the visit, additional usage of staff PPE, and extensive cleaning of exam room while observing appropriate contact time as indicated for disinfecting solutions.  Subjective:     Patient ID: [name] , female    DOB: November 16, 1960 , 58 y.o.   MRN: [id number]   Chief Complaint  Patient presents with  . Annual Exam    HPI  Here for HM  Hypertension This is a chronic problem. The current episode started more than 1 year ago. The problem has been gradually improving since onset. The problem is controlled (reports some low systolics of 297). Pertinent negatives include no anxiety, blurred vision, chest pain, headaches or palpitations. There are no associated agents to hypertension. Risk factors for coronary artery disease include diabetes mellitus and sedentary lifestyle. Past treatments include angiotensin blockers. The current treatment provides significant improvement. There are no compliance problems.  There is no history of angina. There is no history of chronic renal disease.  Diabetes Pertinent negatives for hypoglycemia include no dizziness or headaches. Pertinent negatives for diabetes include no blurred vision, no chest pain, no fatigue, no polydipsia, no polyphagia and no polyuria.    The patient states she uses status post hysterectomy for birth control.  Mammogram last done 10/14/2018.  Negative for: breast discharge, breast lump(s), breast pain and breast self exam.  Pertinent negatives include abnormal bleeding (hematology), anxiety, decreased libido, depression, difficulty falling sleep, dyspareunia, history of infertility, nocturia, sexual dysfunction, sleep disturbances, urinary incontinence, urinary urgency, vaginal discharge and vaginal itching. Diet regular. The patient states her exercise level is minimal.     The patient's tobacco use  is:  Social History   Tobacco Use  Smoking Status Current Every Day Smoker  . Packs/day: 0.30  . Types: Cigarettes  Smokeless Tobacco Never Used  Tobacco Comment   trying to cut back again  . She has been exposed to passive smoke. The patient's alcohol use is:  Social History   Substance and Sexual Activity  Alcohol Use Yes  . Alcohol/week: 0.0 standard drinks   Comment: rare   Additional information: Hysterectomy  Past Medical History:  Diagnosis Date  . Acute kidney failure with lesion of tubular necrosis ([place]) 11/11/2016  . Anxiety   . Back pain   . Diabetes mellitus   . Essential hypertension 11/11/2016  . Fibromyalgia   . High cholesterol   . Hypertension   . Syncope 11/11/2016     Family History  Problem Relation Age of Onset  . Hypertension Mother   . Heart failure Mother   . Diabetes Father   . COPD Father   . Emphysema Father   . Heart failure Maternal Grandmother   . Cancer Maternal Grandfather        LUNG   . Heart disease Maternal Grandfather   . Lung cancer Maternal Grandfather      Current Outpatient Medications:  .  albuterol (VENTOLIN HFA) 108 (90 Base) MCG/ACT inhaler, INHALE 1 PUFF BY MOUTH EVERY 6 HOURS AS NEEDED FOR WHEEZING OR SHORTNESS OF BREATH, Disp: 9 g, Rfl: 0 .  alum & mag hydroxide-simeth (MAALOX/MYLANTA) 200-200-20 MG/5ML suspension, Take 15 mLs by mouth every 4 (four) hours as needed for indigestion or heartburn., Disp: 355 mL, Rfl: 0 .  aspirin 81 MG chewable tablet, Chew 81 mg by mouth daily., Disp: , Rfl:  .  budesonide-formoterol (SYMBICORT) 80-4.5 MCG/ACT inhaler, Inhale  2 puffs into the lungs as needed., Disp: , Rfl:  .  busPIRone (BUSPAR) 10 MG tablet, Take 1 tablet (10 mg total) by mouth 2 (two) times daily., Disp: 180 tablet, Rfl: 1 .  cyclobenzaprine (FLEXERIL) 10 MG tablet, TAKE 1 TABLET BY MOUTH THREE TIMES DAILY, Disp: 30 tablet, Rfl: 0 .  DULoxetine (CYMBALTA) 60 MG capsule, Take 1 capsule (60 mg total) by mouth daily.,  Disp: 90 capsule, Rfl: 1 .  gabapentin (NEURONTIN) 300 MG capsule, Take 1 capsule (300 mg total) by mouth 3 (three) times daily., Disp: 180 capsule, Rfl: 1 .  HYDROcodone-homatropine (HYDROMET) 5-1.5 MG/5ML syrup, Take 5 mLs by mouth every 6 (six) hours as needed., Disp: 120 mL, Rfl: 0 .  Melatonin 5 MG TABS, Take 1 tablet by mouth as needed. , Disp: , Rfl:  .  metFORMIN (GLUCOPHAGE-XR) 750 MG 24 hr tablet, Take 1 tablet by mouth once daily with breakfast, Disp: 90 tablet, Rfl: 0 .  olmesartan (BENICAR) 5 MG tablet, Take 1 tablet by mouth once daily, Disp: 90 tablet, Rfl: 0 .  ondansetron (ZOFRAN) 4 MG tablet, Take 1 tablet (4 mg total) by mouth every 6 (six) hours as needed for nausea., Disp: 20 tablet, Rfl: 0 .  rosuvastatin (CRESTOR) 20 MG tablet, Take 1 tablet (20 mg total) by mouth daily., Disp: 90 tablet, Rfl: 1 .  Tetrahydrozoline HCl (VISINE OP), Place 1 drop into both eyes daily., Disp: , Rfl:  .  blood glucose meter kit and supplies KIT, Dispense based on patient and insurance preference. Use up to four times daily as directed. (FOR ICD-9 250.00, 250.01). (Patient not taking: Reported on 11/04/2018), Disp: 1 each, Rfl: 0 .  Semaglutide (RYBELSUS) 14 MG TABS, Take 1 tablet by mouth daily before breakfast. 30 minutes before breakfast (Patient not taking: Reported on 02/22/2019), Disp: 30 tablet, Rfl: 3   Allergies  Allergen Reactions  . Tylenol [Acetaminophen] Itching  . Pollen Extract   . Shellfish Allergy Hives  . Peanut-Containing Drug Products Hives     Review of Systems  Constitutional: Negative for fatigue and fever.  HENT: Negative.   Eyes: Negative.  Negative for blurred vision.  Respiratory: Negative.   Cardiovascular: Negative for chest pain, palpitations and leg swelling.  Gastrointestinal: Negative.   Endocrine: Negative for polydipsia, polyphagia and polyuria.  Genitourinary: Negative.   Musculoskeletal: Negative.   Skin: Negative.   Neurological: Negative for  dizziness and headaches.  Hematological: Negative.   Psychiatric/Behavioral: Negative.      Today's Vitals   02/22/19 1018  BP: 132/90  Pulse: 82  Temp: 98.5 F (36.9 C)  TempSrc: Oral  Weight: 135 lb 3.2 oz (61.3 kg)  Height: 4' 11.8" (1.519 m)  PainSc: 6   PainLoc: Shoulder   Body mass index is 26.58 kg/m.   Objective:  Physical Exam Vitals reviewed.  Constitutional:      General: She is not in acute distress.    Appearance: Normal appearance. She is not toxic-appearing.  HENT:     Head: Normocephalic and atraumatic.  Eyes:     Extraocular Movements: Extraocular movements intact.     Conjunctiva/sclera: Conjunctivae normal.     Pupils: Pupils are equal, round, and reactive to light.  Cardiovascular:     Rate and Rhythm: Normal rate and regular rhythm.     Pulses: Normal pulses.     Heart sounds: Normal heart sounds. No murmur.  Pulmonary:     Effort: Pulmonary effort is normal. No respiratory distress.  Breath sounds: Normal breath sounds.  Abdominal:     General: Abdomen is flat. Bowel sounds are normal. There is no distension.     Palpations: Abdomen is soft.  Musculoskeletal:        General: No swelling or tenderness. Normal range of motion.     Cervical back: Normal range of motion.  Skin:    General: Skin is warm and dry.     Capillary Refill: Capillary refill takes less than 2 seconds.  Neurological:     General: No focal deficit present.     Mental Status: She is alert and oriented to person, place, and time.  Psychiatric:        Mood and Affect: Mood normal.        Behavior: Behavior normal.        Thought Content: Thought content normal.        Judgment: Judgment normal.         Assessment And Plan:      1. Health maintenance examination . Behavior modifications discussed and diet history reviewed.   . Pt will continue to exercise regularly and modify diet with low GI, plant based foods and decrease intake of processed foods.  . Recommend  intake of daily multivitamin, Vitamin D, and calcium.  . Recommend mammogram and colonoscopy for preventive screenings, as well as recommend immunizations that include influenza, TDAP  2. Need for influenza vaccination  Influenza vaccine given in office  Advised to take Tylenol as needed for muscle aches or fever - Flu Vaccine QUAD 6+ mos PF IM (Fluarix Quad PF)  3. Tobacco abuse  Ready to quit: Yes  Counseling given: Yes  Comment: trying to cut back again  she has patches at home   4. Acute pain of right shoulder  Tenderness to right bursa space.  Limited range of motion   Will treat with toradol IM in office - ketorolac (TORADOL) injection 60 mg  5. Essential hypertension B/P is fairly controlled.  CMP ordered to check renal function.  The importance of regular exercise and dietary modification was stressed to the patient.  - POCT Urinalysis Dipstick (81002) - EKG 12-Lead - CMP14+EGFR - CBC without diff  6. Type 2 diabetes mellitus without complication, without long-term current use of insulin (HCC)  Chronic, HgbA1c was elevated at last visit  Will recheck HgbA1c   I will start her on Januvia - Hemoglobin A1c - sitaGLIPtin (JANUVIA) 25 MG tablet; Take 1 tablet (25 mg total) by mouth daily.  Dispense: 30 tablet; Refill: 2  7. Mixed hyperlipidemia  Chronic,   Stable.  Continue with current control - Lipid Profile   [name], [name]    THE PATIENT IS ENCOURAGED TO PRACTICE SOCIAL DISTANCING DUE TO THE COVID-19 PANDEMIC.

## 2019-02-23 LAB — CMP14+EGFR
ALT: 45 IU/L — ABNORMAL HIGH (ref 0–32)
AST: 41 IU/L — ABNORMAL HIGH (ref 0–40)
Albumin/Globulin Ratio: 1.7 (ref 1.2–2.2)
Albumin: 4 g/dL (ref 3.8–4.9)
Alkaline Phosphatase: 72 IU/L (ref 39–117)
BUN/Creatinine Ratio: 8 — ABNORMAL LOW (ref 9–23)
BUN: 6 mg/dL (ref 6–24)
Bilirubin Total: 0.2 mg/dL (ref 0.0–1.2)
CO2: 22 mmol/L (ref 20–29)
Calcium: 9.2 mg/dL (ref 8.7–10.2)
Chloride: 104 mmol/L (ref 96–106)
Creatinine, Ser: 0.75 mg/dL (ref 0.57–1.00)
GFR calc Af Amer: 101 mL/min/{1.73_m2} (ref >59)
GFR calc non Af Amer: 88 mL/min/{1.73_m2} (ref >59)
Globulin, Total: 2.4 g/dL (ref 1.5–4.5)
Glucose: 86 mg/dL (ref 65–99)
Potassium: 3.9 mmol/L (ref 3.5–5.2)
Sodium: 142 mmol/L (ref 134–144)
Total Protein: 6.4 g/dL (ref 6.0–8.5)

## 2019-02-23 LAB — CBC
Hematocrit: 35.1 % (ref 34.0–46.6)
Hemoglobin: 12.1 g/dL (ref 11.1–15.9)
MCH: 30.3 pg (ref 26.6–33.0)
MCHC: 34.5 g/dL (ref 31.5–35.7)
MCV: 88 fL (ref 79–97)
Platelets: 245 10*3/uL (ref 150–450)
RBC: 4 x10E6/uL (ref 3.77–5.28)
RDW: 12.5 % (ref 11.7–15.4)
WBC: 5.4 10*3/uL (ref 3.4–10.8)

## 2019-02-23 LAB — LIPID PANEL
Chol/HDL Ratio: 2.1 ratio (ref 0.0–4.4)
Cholesterol, Total: 132 mg/dL (ref 100–199)
HDL: 64 mg/dL (ref >39)
LDL Chol Calc (NIH): 51 mg/dL (ref 0–99)
Triglycerides: 87 mg/dL (ref 0–149)
VLDL Cholesterol Cal: 17 mg/dL (ref 5–40)

## 2019-02-23 LAB — HEMOGLOBIN A1C
Est. average glucose Bld gHb Est-mCnc: 128 mg/dL
Hgb A1c MFr Bld: 6.1 % — ABNORMAL HIGH (ref 4.8–5.6)

## 2019-03-02 ENCOUNTER — Other Ambulatory Visit: Payer: Self-pay | Admitting: Nurse Practitioner

## 2019-03-26 ENCOUNTER — Other Ambulatory Visit: Payer: Self-pay | Admitting: Nurse Practitioner

## 2019-05-24 ENCOUNTER — Ambulatory Visit (INDEPENDENT_AMBULATORY_CARE_PROVIDER_SITE_OTHER): Admitting: Nurse Practitioner

## 2019-05-24 ENCOUNTER — Encounter: Payer: Self-pay | Admitting: Nurse Practitioner

## 2019-05-24 ENCOUNTER — Other Ambulatory Visit: Payer: Self-pay

## 2019-05-24 VITALS — BP 130/82 | HR 79 | Temp 98.0°F | Ht 59.8 in | Wt 141.2 lb

## 2019-05-24 DIAGNOSIS — E119 Type 2 diabetes mellitus without complications: Secondary | ICD-10-CM | POA: Diagnosis not present

## 2019-05-24 DIAGNOSIS — I10 Essential (primary) hypertension: Secondary | ICD-10-CM | POA: Diagnosis not present

## 2019-05-24 DIAGNOSIS — M25511 Pain in right shoulder: Secondary | ICD-10-CM

## 2019-05-24 DIAGNOSIS — E782 Mixed hyperlipidemia: Secondary | ICD-10-CM | POA: Diagnosis not present

## 2019-05-24 DIAGNOSIS — G8929 Other chronic pain: Secondary | ICD-10-CM

## 2019-05-24 DIAGNOSIS — J42 Unspecified chronic bronchitis: Secondary | ICD-10-CM | POA: Diagnosis not present

## 2019-05-24 DIAGNOSIS — L659 Nonscarring hair loss, unspecified: Secondary | ICD-10-CM

## 2019-05-24 DIAGNOSIS — L700 Acne vulgaris: Secondary | ICD-10-CM

## 2019-05-24 MED ORDER — HYDROCODONE-HOMATROPINE 5-1.5 MG/5ML PO SYRP: 5.0000 mL | ORAL_SOLUTION | Freq: Four times a day (QID) | ORAL | 0 refills | Status: DC | PRN

## 2019-05-24 MED ORDER — DICLOFENAC EPOLAMINE 1.3 % EX PTCH: 1.0000 | MEDICATED_PATCH | Freq: Two times a day (BID) | CUTANEOUS | 1 refills | Status: DC

## 2019-05-24 MED ORDER — BUDESONIDE-FORMOTEROL FUMARATE 80-4.5 MCG/ACT IN AERO: 2.0000 | INHALATION_SPRAY | Freq: Two times a day (BID) | RESPIRATORY_TRACT | 3 refills | Status: DC

## 2019-05-24 NOTE — Progress Notes (Signed)
Subjective:     Patient ID: [name] , female    DOB: 03/20/1960 , 59 y.o.   MRN: [id number]   Chief Complaint  Patient presents with  . Diabetes  . Hypertension  . Cough    patient would like a refill on her cough syrup    HPI  Hair loss mainly in the back and sides. Breaking off when combing.  The back of her hair is a lot more short.  She tries to drink water.    Wt Readings from Last 3 Encounters: 05/24/19 : 141 lb 3.2 oz (64 kg) 02/22/19 : 135 lb 3.2 oz (61.3 kg) 11/04/18 : 144 lb 6.4 oz (65.5 kg)   Hypertension This is a chronic problem. The current episode started more than 1 year ago. The problem is unchanged. The problem is controlled. Pertinent negatives include no anxiety, chest pain, headaches or shortness of breath. There are no associated agents to hypertension. Risk factors for coronary artery disease include obesity and sedentary lifestyle. Past treatments include angiotensin blockers. There are no compliance problems.  There is no history of angina. There is no history of chronic renal disease.  Cough This is a recurrent problem. The current episode started 1 to 4 weeks ago. The problem has been gradually improving. The problem occurs constantly. The cough is non-productive. Pertinent negatives include no chest pain, ear pain, headaches, sore throat, shortness of breath or wheezing. She has tried a beta-agonist inhaler (using twice a day proair, recent visit to urgent care and treated with prednisone and azithromycin) for the symptoms. The treatment provided no relief. Her past medical history is significant for COPD. There is no history of emphysema.  Diabetes She presents for her follow-up diabetic visit. She has type 2 diabetes mellitus. Her disease course has been stable. Pertinent negatives for hypoglycemia include no dizziness or headaches. Pertinent negatives for diabetes include no chest pain. There are no hypoglycemic complications. There are no diabetic  complications. Risk factors for coronary artery disease include sedentary lifestyle, hypertension and diabetes mellitus. Current diabetic treatment includes oral agent (monotherapy). She is compliant with treatment all of the time. She has not had a previous visit with a dietitian. She rarely participates in exercise. An ACE inhibitor/angiotensin II receptor blocker is being taken. She does not see a podiatrist.Eye exam is not current.     Past Medical History:  Diagnosis Date  . Acute kidney failure with lesion of tubular necrosis ([place]) 11/11/2016  . Anxiety   . Back pain   . Diabetes mellitus   . Essential hypertension 11/11/2016  . Fibromyalgia   . High cholesterol   . Hypertension   . Syncope 11/11/2016     Family History  Problem Relation Age of Onset  . Hypertension Mother   . Heart failure Mother   . Diabetes Father   . COPD Father   . Emphysema Father   . Heart failure Maternal Grandmother   . Cancer Maternal Grandfather        LUNG   . Heart disease Maternal Grandfather   . Lung cancer Maternal Grandfather      Current Outpatient Medications:  .  albuterol (VENTOLIN HFA) 108 (90 Base) MCG/ACT inhaler, INHALE 1 PUFF BY MOUTH EVERY 6 HOURS AS NEEDED FOR WHEEZING OR SHORTNESS OF BREATH, Disp: 9 g, Rfl: 0 .  alum & mag hydroxide-simeth (MAALOX/MYLANTA) 200-200-20 MG/5ML suspension, Take 15 mLs by mouth every 4 (four) hours as needed for indigestion or heartburn., Disp: 355 mL,  Rfl: 0 .  aspirin 81 MG chewable tablet, Chew 81 mg by mouth daily., Disp: , Rfl:  .  budesonide-formoterol (SYMBICORT) 80-4.5 MCG/ACT inhaler, Inhale 2 puffs into the lungs as needed., Disp: , Rfl:  .  busPIRone (BUSPAR) 10 MG tablet, Take 1 tablet (10 mg total) by mouth 2 (two) times daily. (Patient taking differently: Take 10 mg by mouth as needed. ), Disp: 180 tablet, Rfl: 1 .  cyclobenzaprine (FLEXERIL) 10 MG tablet, TAKE 1 TABLET BY MOUTH THREE TIMES DAILY, Disp: 30 tablet, Rfl: 0 .  DULoxetine  (CYMBALTA) 60 MG capsule, Take 1 capsule by mouth once daily, Disp: 90 capsule, Rfl: 0 .  gabapentin (NEURONTIN) 300 MG capsule, Take 1 capsule (300 mg total) by mouth 3 (three) times daily., Disp: 180 capsule, Rfl: 1 .  Melatonin 5 MG TABS, Take 1 tablet by mouth as needed. , Disp: , Rfl:  .  metFORMIN (GLUCOPHAGE-XR) 750 MG 24 hr tablet, Take 1 tablet by mouth once daily with breakfast, Disp: 90 tablet, Rfl: 0 .  olmesartan (BENICAR) 5 MG tablet, Take 1 tablet by mouth once daily, Disp: 90 tablet, Rfl: 0 .  ondansetron (ZOFRAN) 4 MG tablet, Take 1 tablet (4 mg total) by mouth every 6 (six) hours as needed for nausea., Disp: 20 tablet, Rfl: 0 .  rosuvastatin (CRESTOR) 20 MG tablet, Take 1 tablet (20 mg total) by mouth daily., Disp: 90 tablet, Rfl: 1 .  sitaGLIPtin (JANUVIA) 25 MG tablet, Take 1 tablet (25 mg total) by mouth daily., Disp: 30 tablet, Rfl: 2 .  Tetrahydrozoline HCl (VISINE OP), Place 1 drop into both eyes daily., Disp: , Rfl:  .  blood glucose meter kit and supplies KIT, Dispense based on patient and insurance preference. Use up to four times daily as directed. (FOR ICD-9 250.00, 250.01). (Patient not taking: Reported on 11/04/2018), Disp: 1 each, Rfl: 0   Allergies  Allergen Reactions  . Tylenol [Acetaminophen] Itching  . Pollen Extract   . Shellfish Allergy Hives  . Peanut-Containing Drug Products Hives     Review of Systems  Constitutional: Negative.   HENT: Negative for ear pain and sore throat.   Respiratory: Negative for cough, shortness of breath and wheezing.   Cardiovascular: Negative for chest pain.  Neurological: Negative for dizziness and headaches.  Psychiatric/Behavioral: Negative.      Today's Vitals   05/24/19 1000  BP: 130/82  Pulse: 79  Temp: 98 F (36.7 C)  TempSrc: Oral  Weight: 141 lb 3.2 oz (64 kg)  Height: 4' 11.8" (1.519 m)  PainSc: 0-No pain   Body mass index is 27.76 kg/m.   Objective:  Physical Exam Vitals reviewed.   Constitutional:      General: She is not in acute distress.    Appearance: Normal appearance.  Cardiovascular:     Rate and Rhythm: Normal rate and regular rhythm.     Pulses: Normal pulses.     Heart sounds: Normal heart sounds. No murmur.  Pulmonary:     Effort: Pulmonary effort is normal. No respiratory distress.     Breath sounds: Normal breath sounds. No stridor. No wheezing or rhonchi.  Skin:    General: Skin is warm.     Capillary Refill: Capillary refill takes less than 2 seconds.  Neurological:     General: No focal deficit present.     Mental Status: She is alert and oriented to person, place, and time.     Cranial Nerves: No cranial nerve deficit.  Psychiatric:        Mood and Affect: Mood normal.        Behavior: Behavior normal.        Thought Content: Thought content normal.        Judgment: Judgment normal.         Assessment And Plan:     1. Type 2 diabetes mellitus without complication, without long-term current use of insulin (HCC)  Chronic, improved at last visit  Continue with medications  She reports having to pay an excessive amount of money for her [facility] Gentile will try to get her set up with patient assistance.  2. Mixed hyperlipidemia  Chronic, controlled  Continue with current medications  3. Essential hypertension  Chronic, she is under good control  Encouraged to try to quit smoking.   4. Chronic bronchitis, unspecified chronic bronchitis type ([place])  I have advised her to use her symbicort twice a day to see if this helps so she does not have to use her hydromet as much - budesonide-formoterol (SYMBICORT) 80-4.5 MCG/ACT inhaler; Inhale 2 puffs into the lungs 2 (two) times daily.  Dispense: 1 Inhaler; Refill: 3 - HYDROcodone-homatropine (HYDROMET) 5-1.5 MG/5ML syrup; Take 5 mLs by mouth every 6 (six) hours as needed.  Dispense: 120 mL; Refill: 0  5. Closed comedone  She has a comedone to the upper middle part of her back she would like  to go to a dermatologist to have evaluated  6. Chronic right shoulder pain  Steroid injection was not effective to her right shoulder pain  Will refer to orthopedic for further evaluation - Ambulatory referral to Orthopedic Surgery  7. Hair loss  Hair thinning to back of scalp and on the sides  Encouraged to make sure she is staying hydrated and to cut back on smoking  She is to moisturize her hair regularly         [name], [name]    THE PATIENT IS ENCOURAGED TO PRACTICE SOCIAL DISTANCING DUE TO THE COVID-19 PANDEMIC.

## 2019-05-25 LAB — HEMOGLOBIN A1C
Est. average glucose Bld gHb Est-mCnc: 160 mg/dL
Hgb A1c MFr Bld: 7.2 % — ABNORMAL HIGH (ref 4.8–5.6)

## 2019-05-25 LAB — LIPID PANEL
Chol/HDL Ratio: 2 ratio (ref 0.0–4.4)
Cholesterol, Total: 146 mg/dL (ref 100–199)
HDL: 72 mg/dL (ref >39)
LDL Chol Calc (NIH): 58 mg/dL (ref 0–99)
Triglycerides: 86 mg/dL (ref 0–149)
VLDL Cholesterol Cal: 16 mg/dL (ref 5–40)

## 2019-05-25 LAB — CMP14+EGFR
ALT: 21 IU/L (ref 0–32)
AST: 25 IU/L (ref 0–40)
Albumin/Globulin Ratio: 1.7 (ref 1.2–2.2)
Albumin: 4.8 g/dL (ref 3.8–4.9)
Alkaline Phosphatase: 103 IU/L (ref 39–117)
BUN/Creatinine Ratio: 18 (ref 9–23)
BUN: 15 mg/dL (ref 6–24)
Bilirubin Total: <0.2 mg/dL (ref 0.0–1.2)
CO2: 21 mmol/L (ref 20–29)
Calcium: 9.8 mg/dL (ref 8.7–10.2)
Chloride: 100 mmol/L (ref 96–106)
Creatinine, Ser: 0.83 mg/dL (ref 0.57–1.00)
GFR calc Af Amer: 89 mL/min/{1.73_m2} (ref >59)
GFR calc non Af Amer: 77 mL/min/{1.73_m2} (ref >59)
Globulin, Total: 2.9 g/dL (ref 1.5–4.5)
Glucose: 152 mg/dL — ABNORMAL HIGH (ref 65–99)
Potassium: 4.4 mmol/L (ref 3.5–5.2)
Sodium: 137 mmol/L (ref 134–144)
Total Protein: 7.7 g/dL (ref 6.0–8.5)

## 2019-06-07 ENCOUNTER — Other Ambulatory Visit: Payer: Self-pay | Admitting: Nurse Practitioner

## 2019-06-07 ENCOUNTER — Other Ambulatory Visit: Payer: Self-pay | Admitting: Internal Medicine

## 2019-07-12 ENCOUNTER — Other Ambulatory Visit: Payer: Self-pay | Admitting: Orthopedic Surgery

## 2019-07-12 DIAGNOSIS — M25511 Pain in right shoulder: Secondary | ICD-10-CM

## 2019-07-26 ENCOUNTER — Other Ambulatory Visit: Payer: Self-pay | Admitting: Nurse Practitioner

## 2019-07-26 DIAGNOSIS — E119 Type 2 diabetes mellitus without complications: Secondary | ICD-10-CM

## 2019-08-13 ENCOUNTER — Other Ambulatory Visit

## 2019-09-03 IMAGING — CT CT ABD-PELV W/ CM
2 of 5 series · 16 of 46 positions shown, 18 images · IV contrast (ISOVUE)
Comparison: None.

CLINICAL DATA: Abdominal pain.  Diverticulitis suspected.

EXAM:
CT ABDOMEN AND PELVIS WITH CONTRAST
TECHNIQUE: Multidetector CT imaging of the abdomen and pelvis was performed
using the standard protocol following bolus administration of
intravenous contrast.
CONTRAST:  70mL [id number]-4UU IOPAMIDOL ([id number]) INJECTION 61%

[Series 2: axial st · axial · 0.69mm/px · z∈[-411,-66]mm · 13 of 81 slices shown, 15 images]
[im 6/81  soft-tissue]
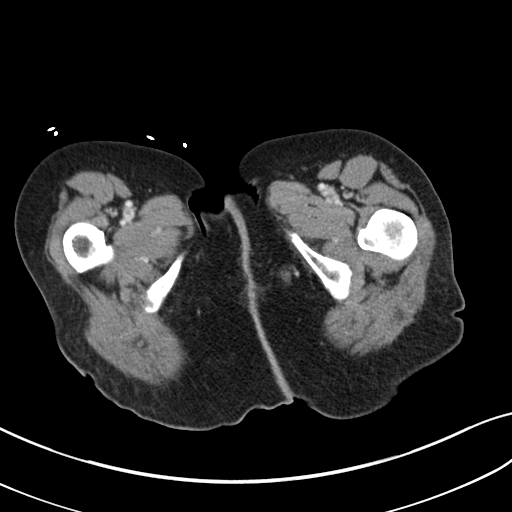
[im 6/81  bone]
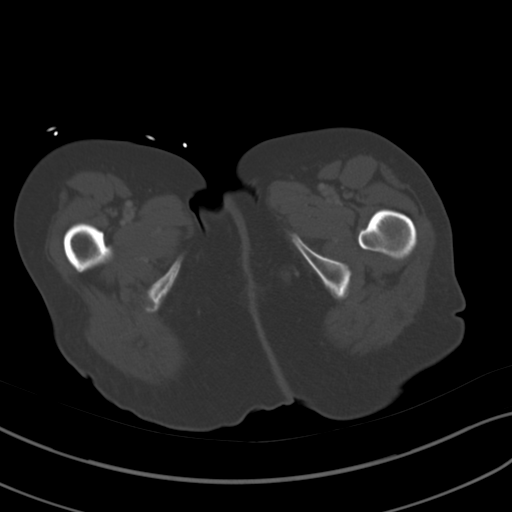
[im 12/81  soft-tissue]
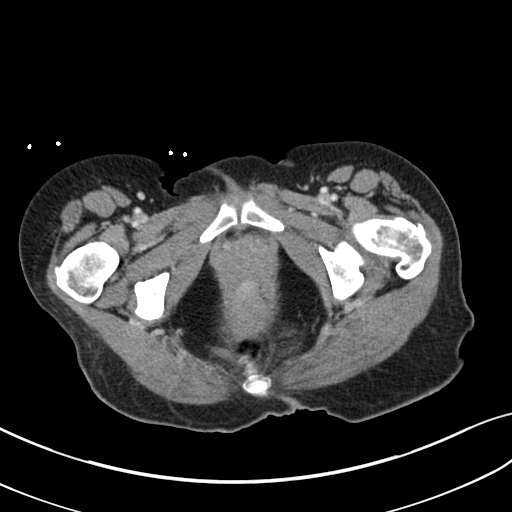
[im 18/81  soft-tissue]
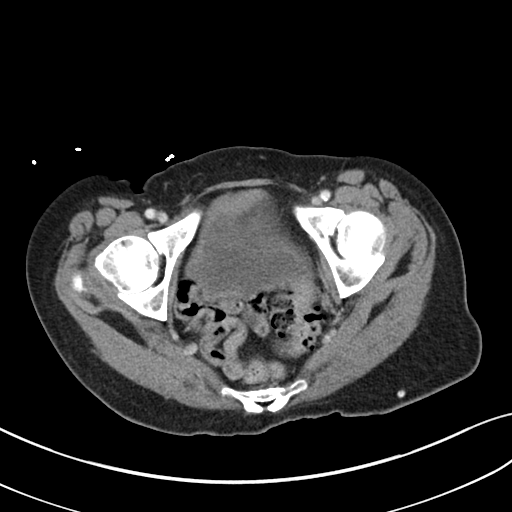
[im 23/81  soft-tissue]
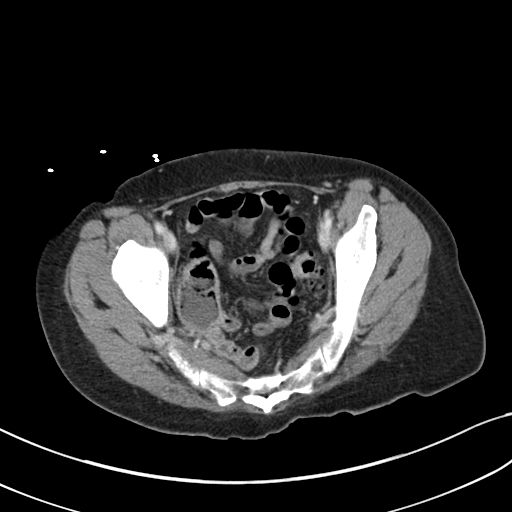
[im 29/81  soft-tissue]
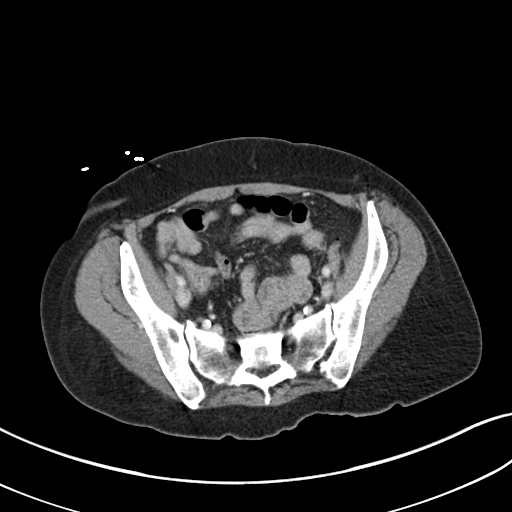
[im 35/81  soft-tissue]
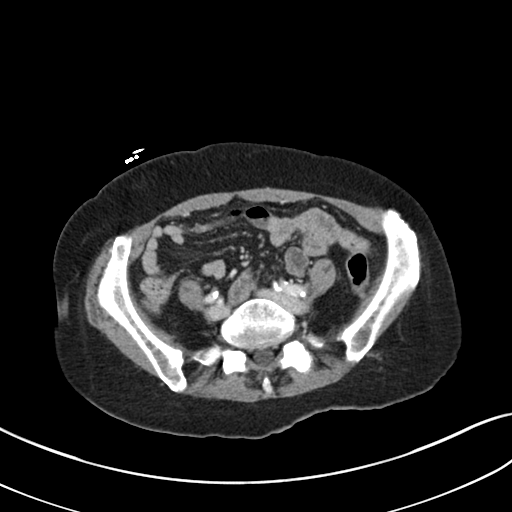
[im 41/81  soft-tissue]
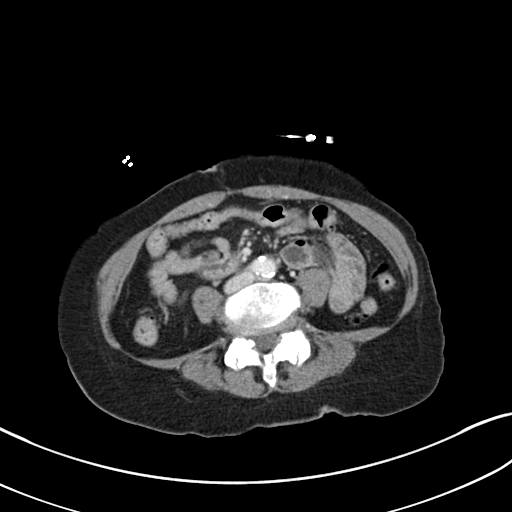
[im 46/81  soft-tissue]
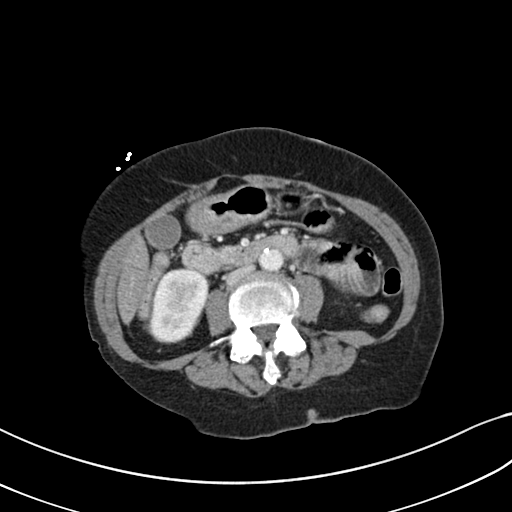
[im 52/81  soft-tissue]
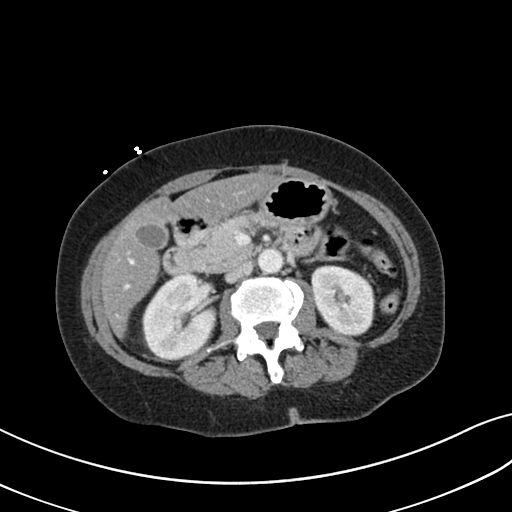
[im 52/81  bone]
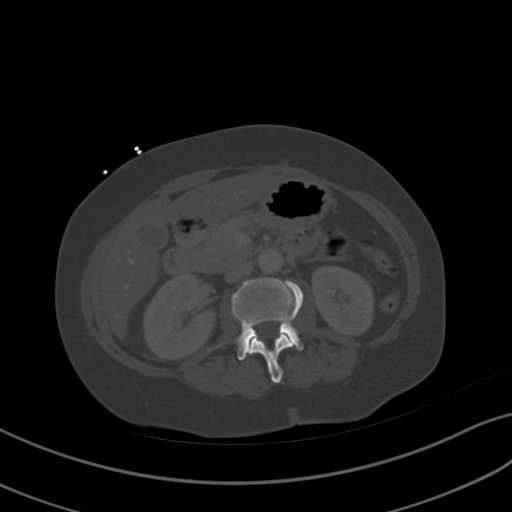
[im 58/81  soft-tissue]
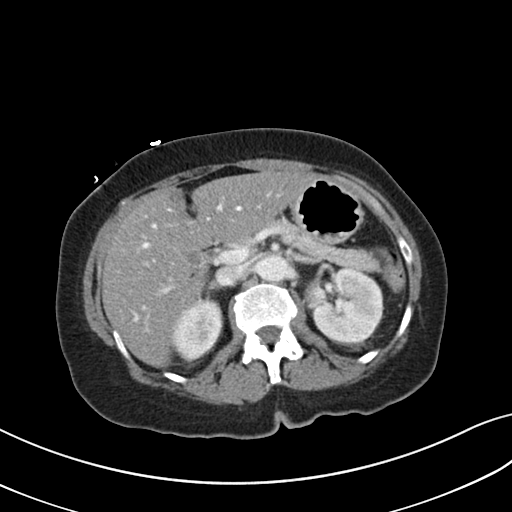
[im 63/81  soft-tissue]
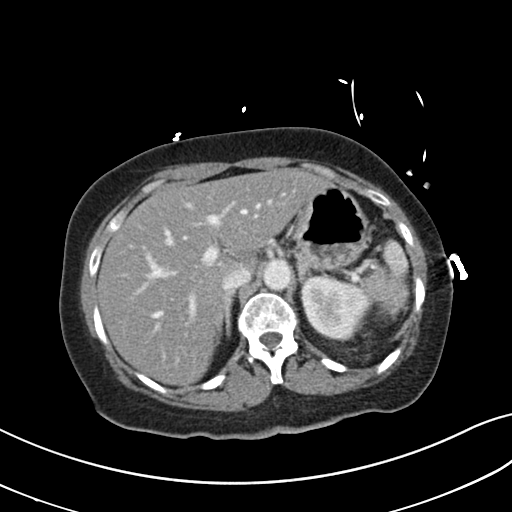
[im 69/81  soft-tissue]
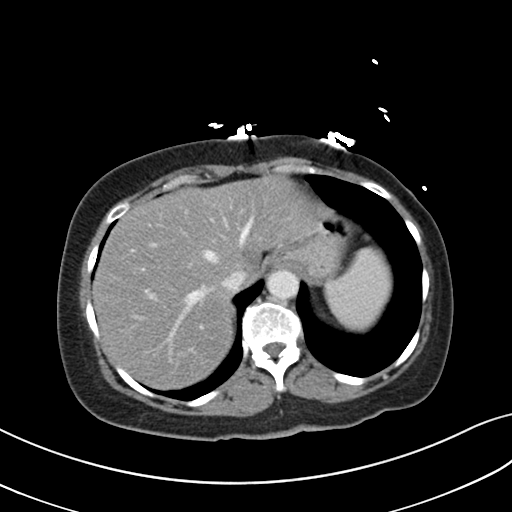
[im 75/81  soft-tissue]
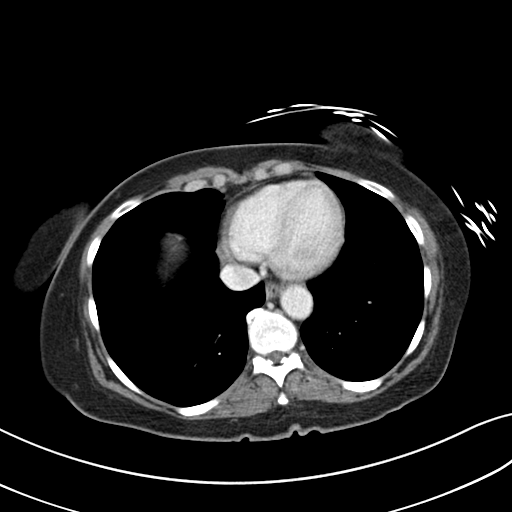

[Series 5: coronal st · coronal · 0.57mm/px · 3 of 87 slices shown]
[im 29/87  soft-tissue]
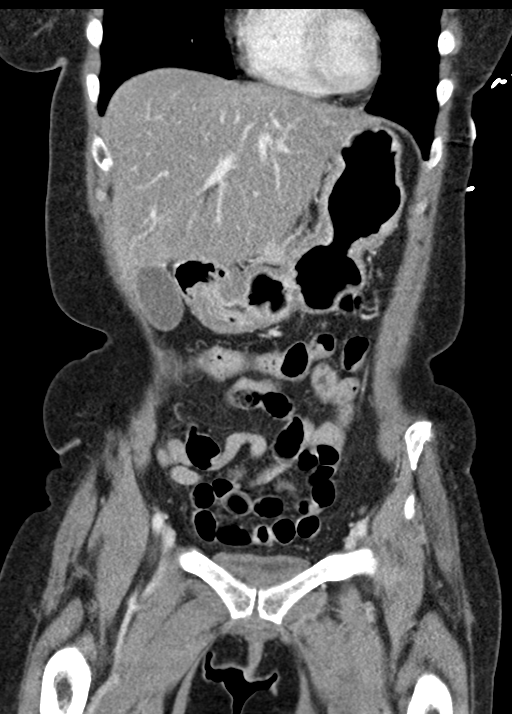
[im 39/87  soft-tissue]
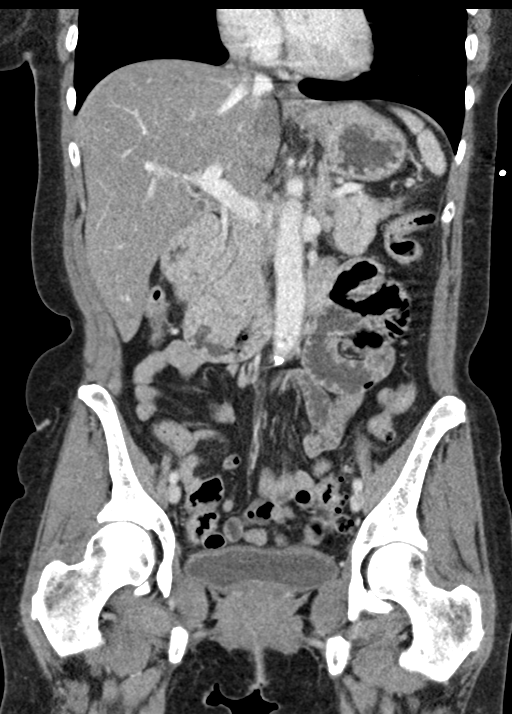
[im 48/87  soft-tissue]
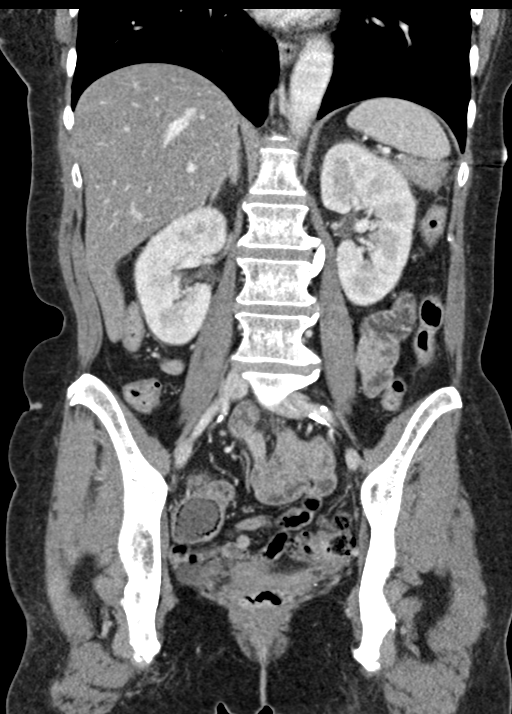

[16 of 46 positions shown; findings below may reference images not displayed]

FINDINGS: Lower chest: Clear lung bases.  Heart normal in size.

Hepatobiliary: Liver is borderline enlarged with diffuse decreased
attenuation consistent fatty infiltration. No liver mass or focal
lesion. Normal gallbladder. No bile duct dilation.

Pancreas: Unremarkable. No pancreatic ductal dilatation or
surrounding inflammatory changes.

Spleen: Normal in size without focal abnormality.

Adrenals/Urinary Tract: Adrenal glands are unremarkable. Kidneys are
normal, without renal calculi, focal lesion, or hydronephrosis.
Bladder is unremarkable.

Stomach/Bowel: There are numerous colonic diverticula, more
prominent on the left. No evidence of diverticulitis. No colonic
wall thickening or inflammation.

Stomach and small bowel are unremarkable.

Normal appendix visualized.

Vascular/Lymphatic: Aortic atherosclerosis. No enlarged abdominal or
pelvic lymph nodes.

Reproductive: Status post hysterectomy. No adnexal masses.

Other: No abdominal wall hernia or abnormality. No abdominopelvic
ascites.

Musculoskeletal: No fracture or acute finding. No osteoblastic or
osteolytic lesions.
IMPRESSION: 1. No acute findings.
2. Numerous colonic diverticula without evidence of diverticulitis.
3. Hepatic steatosis.
4. Aortic atherosclerosis.

## 2019-09-09 ENCOUNTER — Other Ambulatory Visit: Payer: Self-pay | Admitting: Internal Medicine

## 2019-09-13 ENCOUNTER — Other Ambulatory Visit: Payer: Self-pay | Admitting: Nurse Practitioner

## 2019-09-13 DIAGNOSIS — F419 Anxiety disorder, unspecified: Secondary | ICD-10-CM

## 2019-10-10 ENCOUNTER — Other Ambulatory Visit: Payer: Self-pay | Admitting: Nurse Practitioner

## 2019-11-23 ENCOUNTER — Other Ambulatory Visit: Payer: Self-pay | Admitting: Nurse Practitioner

## 2019-11-24 ENCOUNTER — Ambulatory Visit: Admitting: Nurse Practitioner

## 2019-11-27 ENCOUNTER — Other Ambulatory Visit: Payer: Self-pay | Admitting: Nurse Practitioner

## 2019-11-27 DIAGNOSIS — F419 Anxiety disorder, unspecified: Secondary | ICD-10-CM

## 2019-12-06 ENCOUNTER — Other Ambulatory Visit: Payer: Self-pay

## 2019-12-06 ENCOUNTER — Ambulatory Visit (INDEPENDENT_AMBULATORY_CARE_PROVIDER_SITE_OTHER): Admitting: Nurse Practitioner

## 2019-12-06 ENCOUNTER — Encounter: Payer: Self-pay | Admitting: Nurse Practitioner

## 2019-12-06 VITALS — BP 116/80 | HR 72 | Temp 98.2°F | Ht 60.6 in | Wt 135.8 lb

## 2019-12-06 DIAGNOSIS — I10 Essential (primary) hypertension: Secondary | ICD-10-CM

## 2019-12-06 DIAGNOSIS — E119 Type 2 diabetes mellitus without complications: Secondary | ICD-10-CM

## 2019-12-06 DIAGNOSIS — Z23 Encounter for immunization: Secondary | ICD-10-CM

## 2019-12-06 DIAGNOSIS — N393 Stress incontinence (female) (male): Secondary | ICD-10-CM | POA: Diagnosis not present

## 2019-12-06 DIAGNOSIS — F419 Anxiety disorder, unspecified: Secondary | ICD-10-CM | POA: Diagnosis not present

## 2019-12-06 DIAGNOSIS — J42 Unspecified chronic bronchitis: Secondary | ICD-10-CM

## 2019-12-06 DIAGNOSIS — Z720 Tobacco use: Secondary | ICD-10-CM

## 2019-12-06 LAB — POCT URINALYSIS DIPSTICK
Bilirubin, UA: NEGATIVE
Blood, UA: NEGATIVE
Glucose, UA: NEGATIVE
Ketones, UA: NEGATIVE
Leukocytes, UA: NEGATIVE
Nitrite, UA: NEGATIVE
Protein, UA: NEGATIVE
Spec Grav, UA: 1.015 (ref 1.010–1.025)
Urobilinogen, UA: 0.2 E.U./dL
pH, UA: 6 (ref 5.0–8.0)

## 2019-12-06 LAB — POCT UA - MICROALBUMIN
Albumin/Creatinine Ratio, Urine, POC: 30
Creatinine, POC: 10 mg/dL
Microalbumin Ur, POC: 10 mg/L

## 2019-12-06 MED ORDER — METFORMIN HCL ER 750 MG PO TB24: 750.0000 mg | ORAL_TABLET | Freq: Every day | ORAL | 0 refills | Status: DC

## 2019-12-06 MED ORDER — GABAPENTIN 300 MG PO CAPS: 300.0000 mg | ORAL_CAPSULE | Freq: Three times a day (TID) | ORAL | 1 refills | Status: DC

## 2019-12-06 MED ORDER — SITAGLIPTIN PHOSPHATE 25 MG PO TABS: 25.0000 mg | ORAL_TABLET | Freq: Every day | ORAL | 1 refills | Status: DC

## 2019-12-06 MED ORDER — BUSPIRONE HCL 10 MG PO TABS: 10.0000 mg | ORAL_TABLET | ORAL | 1 refills | Status: DC | PRN

## 2019-12-06 MED ORDER — SPIRIVA HANDIHALER 18 MCG IN CAPS: 18.0000 ug | ORAL_CAPSULE | Freq: Every day | RESPIRATORY_TRACT | 2 refills | Status: DC

## 2019-12-06 MED ORDER — DULOXETINE HCL 60 MG PO CPEP: 60.0000 mg | ORAL_CAPSULE | Freq: Every day | ORAL | 1 refills | Status: DC

## 2019-12-06 NOTE — Progress Notes (Signed)
I,Yamilka Roman [employer] as a Education administrator for [employer], [name].,have documented all relevant documentation on the behalf of [name], [name],as directed by  [name], [name] while in the presence of [name], [place]. This visit occurred during the SARS-CoV-2 public health emergency.  Safety protocols were in place, including screening questions prior to the visit, additional usage of staff PPE, and extensive cleaning of exam room while observing appropriate contact time as indicated for disinfecting solutions.  Subjective:     Patient ID: [name] , female    DOB: 06/17/1960 , 59 y.o.   MRN: [id number]   Chief Complaint  Patient presents with  . Diabetes  . Hypertension  . Cough    patient stated she has phelgm in her chest and the mucinex isnt clearing it up    HPI  Here for diabetes and hypertension.   Wt Readings from Last 3 Encounters: 12/06/19 : 135 lb 12.8 oz (61.6 kg) 05/24/19 : 141 lb 3.2 oz (64 kg) 02/22/19 : 135 lb 3.2 oz (61.3 kg)  She reports she is taking her medications as directed.   Diabetes She presents for her follow-up diabetic visit. She has type 2 diabetes mellitus. Her disease course has been stable. Pertinent negatives for hypoglycemia include no dizziness. Pertinent negatives for diabetes include no chest pain, no polydipsia, no polyphagia and no polyuria. There are no hypoglycemic complications. There are no diabetic complications. Risk factors for coronary artery disease include sedentary lifestyle, hypertension and diabetes mellitus. Current diabetic treatment includes oral agent (monotherapy). She is compliant with treatment all of the time. She has not had a previous visit with a dietitian. She rarely participates in exercise. An ACE inhibitor/angiotensin II receptor blocker is being taken. She does not see a podiatrist.Eye exam is not current.  Hypertension This is a chronic problem. The current episode started more than 1 year ago. The problem  is unchanged. The problem is controlled. Associated symptoms include shortness of breath (intermittent with exertion). Pertinent negatives include no anxiety, chest pain or palpitations. There are no associated agents to hypertension. Risk factors for coronary artery disease include obesity and sedentary lifestyle. Past treatments include angiotensin blockers. There are no compliance problems.  There is no history of angina or kidney disease. There is no history of chronic renal disease.     Past Medical History:  Diagnosis Date  . Acute kidney failure with lesion of tubular necrosis ([place]) 11/11/2016  . Anxiety   . Back pain   . Diabetes mellitus   . Essential hypertension 11/11/2016  . Fibromyalgia   . High cholesterol   . Hypertension   . Syncope 11/11/2016     Family History  Problem Relation Age of Onset  . Hypertension Mother   . Heart failure Mother   . Diabetes Father   . COPD Father   . Emphysema Father   . Heart failure Maternal Grandmother   . Cancer Maternal Grandfather        LUNG   . Heart disease Maternal Grandfather   . Lung cancer Maternal Grandfather      Current Outpatient Medications:  .  albuterol (VENTOLIN HFA) 108 (90 Base) MCG/ACT inhaler, INHALE 1 PUFF BY MOUTH EVERY 6 HOURS AS NEEDED FOR WHEEZING OR SHORTNESS OF BREATH, Disp: 9 g, Rfl: 0 .  alum & mag hydroxide-simeth (MAALOX/MYLANTA) 200-200-20 MG/5ML suspension, Take 15 mLs by mouth every 4 (four) hours as needed for indigestion or heartburn., Disp: 355 mL, Rfl: 0 .  aspirin 81  MG chewable tablet, Chew 81 mg by mouth daily., Disp: , Rfl:  .  budesonide-formoterol (SYMBICORT) 80-4.5 MCG/ACT inhaler, Inhale 2 puffs into the lungs 2 (two) times daily., Disp: 1 Inhaler, Rfl: 3 .  busPIRone (BUSPAR) 10 MG tablet, Take 1 tablet (10 mg total) by mouth as needed., Disp: 90 tablet, Rfl: 1 .  cyclobenzaprine (FLEXERIL) 10 MG tablet, TAKE 1 TABLET BY MOUTH THREE TIMES DAILY, Disp: 30 tablet, Rfl: 0 .  DULoxetine  (CYMBALTA) 60 MG capsule, Take 1 capsule (60 mg total) by mouth daily., Disp: 90 capsule, Rfl: 1 .  gabapentin (NEURONTIN) 300 MG capsule, Take 1 capsule (300 mg total) by mouth 3 (three) times daily., Disp: 180 capsule, Rfl: 1 .  HYDROcodone-homatropine (HYDROMET) 5-1.5 MG/5ML syrup, Take 5 mLs by mouth every 6 (six) hours as needed., Disp: 120 mL, Rfl: 0 .  Melatonin 5 MG TABS, Take 1 tablet by mouth as needed. , Disp: , Rfl:  .  metFORMIN (GLUCOPHAGE-XR) 750 MG 24 hr tablet, Take 1 tablet (750 mg total) by mouth daily with breakfast., Disp: 90 tablet, Rfl: 0 .  olmesartan (BENICAR) 5 MG tablet, Take 1 tablet by mouth once daily, Disp: 90 tablet, Rfl: 0 .  ondansetron (ZOFRAN) 4 MG tablet, Take 1 tablet (4 mg total) by mouth every 6 (six) hours as needed for nausea., Disp: 20 tablet, Rfl: 0 .  rosuvastatin (CRESTOR) 20 MG tablet, Take 1 tablet by mouth once daily, Disp: 90 tablet, Rfl: 0 .  sitaGLIPtin (JANUVIA) 25 MG tablet, Take 1 tablet (25 mg total) by mouth daily., Disp: 90 tablet, Rfl: 1 .  Tetrahydrozoline HCl (VISINE OP), Place 1 drop into both eyes daily., Disp: , Rfl:  .  blood glucose meter kit and supplies KIT, Dispense based on patient and insurance preference. Use up to four times daily as directed. (FOR ICD-9 250.00, 250.01). (Patient not taking: Reported on 11/04/2018), Disp: 1 each, Rfl: 0 .  diclofenac (FLECTOR) 1.3 % PTCH, Place 1 patch onto the skin 2 (two) times daily. (Patient not taking: Reported on 12/06/2019), Disp: 60 patch, Rfl: 1 .  tiotropium (SPIRIVA HANDIHALER) 18 MCG inhalation capsule, Place 1 capsule (18 mcg total) into inhaler and inhale daily., Disp: 30 capsule, Rfl: 2   Allergies  Allergen Reactions  . Tylenol [Acetaminophen] Itching  . Pollen Extract   . Shellfish Allergy Hives  . Peanut-Containing Drug Products Hives     Review of Systems  Constitutional: Negative.   Eyes: Negative.   Respiratory: Positive for cough (yellow secretions ) and shortness  of breath (intermittent with exertion).   Cardiovascular: Negative for chest pain, palpitations and leg swelling.  Gastrointestinal: Negative.   Endocrine: Negative.  Negative for polydipsia, polyphagia and polyuria.  Genitourinary: Negative.   Skin: Negative.   Neurological: Negative for dizziness.  Psychiatric/Behavioral: Negative.      Today's Vitals   12/06/19 0914  BP: 116/80  Pulse: 72  Temp: 98.2 F (36.8 C)  TempSrc: Oral  Weight: 135 lb 12.8 oz (61.6 kg)  Height: 5' 0.6" (1.539 m)  PainSc: 6   PainLoc: Back   Body mass index is 26 kg/m.   Objective:  Physical Exam Vitals reviewed.  Constitutional:      General: She is not in acute distress.    Appearance: Normal appearance.  Eyes:     Pupils: Pupils are equal, round, and reactive to light.  Cardiovascular:     Rate and Rhythm: Normal rate and regular rhythm.  Pulses: Normal pulses.     Heart sounds: Normal heart sounds. No murmur heard.   Pulmonary:     Effort: Pulmonary effort is normal. No respiratory distress.     Breath sounds: Normal breath sounds. No wheezing.  Skin:    General: Skin is warm and dry.  Neurological:     General: No focal deficit present.     Mental Status: She is alert and oriented to person, place, and time.     Cranial Nerves: No cranial nerve deficit.  Psychiatric:        Mood and Affect: Mood normal.        Behavior: Behavior normal.        Thought Content: Thought content normal.        Judgment: Judgment normal.         Assessment And Plan:     1. Type 2 diabetes mellitus without complication, without long-term current use of insulin (HCC)  Chronic, her HgbA1c was elevated at last visit  Will check HgbA1c - sitaGLIPtin (JANUVIA) 25 MG tablet; Take 1 tablet (25 mg total) by mouth daily.  Dispense: 90 tablet; Refill: 1 - metFORMIN (GLUCOPHAGE-XR) 750 MG 24 hr tablet; Take 1 tablet (750 mg total) by mouth daily with breakfast.  Dispense: 90 tablet; Refill: 0 - Lipid  panel - Hemoglobin A1c  2. Essential hypertension  Chronic, blood pressure is well controlled  Continue with current medications - CMP14+EGFR  3. Stress incontinence of urine  Will check urinalysis for infection however this is likely related to stress incontinence.  She may benefit with from pelvic rehab prior to considering medications  4. Anxiety  Chronic, she has been overall doing better - busPIRone (BUSPAR) 10 MG tablet; Take 1 tablet (10 mg total) by mouth as needed.  Dispense: 90 tablet; Refill: 1  5. Chronic bronchitis, unspecified chronic bronchitis type ([place])  I will start her on spiriva she tells me her symbicort was expensive.  I will also refer her to pulmonary to see if she can have a PFT since she has a history of tobacco abuse   Her lungs are clear bilaterally but has a moist cough, explained not always is yellow secretions a sign of infection - tiotropium (SPIRIVA HANDIHALER) 18 MCG inhalation capsule; Place 1 capsule (18 mcg total) into inhaler and inhale daily.  Dispense: 30 capsule; Refill: 2 - Ambulatory referral to Pulmonology  6. Tobacco abuse  Discussed importance of quitting smoking and the risk for cardiac and pulmonary disease.  - Ambulatory referral to Pulmonology  7. Need for influenza vaccination  Influenza vaccine administered  Encouraged to take Tylenol as needed for fever or muscle aches. - Flu Vaccine QUAD 6+ mos PF IM (Fluarix Quad PF)    Patient was given opportunity to ask questions. Patient verbalized understanding of the plan and was able to repeat key elements of the plan. All questions were answered to their satisfaction.   Teola Bradley, [name], have reviewed all documentation for this visit. The documentation on 12/06/19 for the exam, diagnosis, procedures, and orders are all accurate and complete.   THE PATIENT IS ENCOURAGED TO PRACTICE SOCIAL DISTANCING DUE TO THE COVID-19 PANDEMIC.

## 2019-12-06 NOTE — Patient Instructions (Addendum)
Diabetes Basics  Diabetes (diabetes mellitus) is a long-term (chronic) disease. It occurs when the body does not properly use sugar (glucose) that is released from food after you eat. Diabetes may be caused by one or both of these problems:  Your pancreas does not make enough of a hormone called insulin.  Your body does not react in a normal way to insulin that it makes. Insulin lets sugars (glucose) go into cells in your body. This gives you energy. If you have diabetes, sugars cannot get into cells. This causes high blood sugar (hyperglycemia). Follow these instructions at home: How is diabetes treated? You may need to take insulin or other diabetes medicines daily to keep your blood sugar in balance. Take your diabetes medicines every day as told by your doctor. List your diabetes medicines here: Diabetes medicines  Name of medicine: ______________________________ ? Amount (dose): _______________ Time (a.m./p.m.): _______________ Notes: ___________________________________  Name of medicine: ______________________________ ? Amount (dose): _______________ Time (a.m./p.m.): _______________ Notes: ___________________________________  Name of medicine: ______________________________ ? Amount (dose): _______________ Time (a.m./p.m.): _______________ Notes: ___________________________________ If you use insulin, you will learn how to give yourself insulin by injection. You may need to adjust the amount based on the food that you eat. List the types of insulin you use here: Insulin  Insulin type: ______________________________ ? Amount (dose): _______________ Time (a.m./p.m.): _______________ Notes: ___________________________________  Insulin type: ______________________________ ? Amount (dose): _______________ Time (a.m./p.m.): _______________ Notes: ___________________________________  Insulin type: ______________________________ ? Amount (dose): _______________ Time (a.m./p.m.):  _______________ Notes: ___________________________________  Insulin type: ______________________________ ? Amount (dose): _______________ Time (a.m./p.m.): _______________ Notes: ___________________________________  Insulin type: ______________________________ ? Amount (dose): _______________ Time (a.m./p.m.): _______________ Notes: ___________________________________ How do I manage my blood sugar?  Check your blood sugar levels using a blood glucose monitor as directed by your doctor. Your doctor will set treatment goals for you. Generally, you should have these blood sugar levels:  Before meals (preprandial): 80-130 mg/dL (4.4-7.2 mmol/L).  After meals (postprandial): below 180 mg/dL (10 mmol/L).  A1c level: less than 7%. Write down the times that you will check your blood sugar levels: Blood sugar checks  Time: _______________ Notes: ___________________________________  Time: _______________ Notes: ___________________________________  Time: _______________ Notes: ___________________________________  Time: _______________ Notes: ___________________________________  Time: _______________ Notes: ___________________________________  Time: _______________ Notes: ___________________________________  What do I need to know about low blood sugar? Low blood sugar is called hypoglycemia. This is when blood sugar is at or below 70 mg/dL (3.9 mmol/L). Symptoms may include:  Feeling: ? Hungry. ? Worried or nervous (anxious). ? Sweaty and clammy. ? Confused. ? Dizzy. ? Sleepy. ? Sick to your stomach (nauseous).  Having: ? A fast heartbeat. ? A headache. ? A change in your vision. ? Tingling or no feeling (numbness) around the mouth, lips, or tongue. ? Jerky movements that you cannot control (seizure).  Having trouble with: ? Moving (coordination). ? Sleeping. ? Passing out (fainting). ? Getting upset easily (irritability). Treating low blood sugar To treat low blood  sugar, eat or drink something sugary right away. If you can think clearly and swallow safely, follow the 15:15 rule:  Take 15 grams of a fast-acting carb (carbohydrate). Talk with your doctor about how much you should take.  Some fast-acting carbs are: ? Sugar tablets (glucose pills). Take 3-4 glucose pills. ? 6-8 pieces of hard candy. ? 4-6 oz (120-150 mL) of fruit juice. ? 4-6 oz (120-150 mL) of regular (not diet) soda. ? 1 Tbsp (15 mL) honey or sugar.    Check your blood sugar 15 minutes after you take the carb.  If your blood sugar is still at or below 70 mg/dL (3.9 mmol/L), take 15 grams of a carb again.  If your blood sugar does not go above 70 mg/dL (3.9 mmol/L) after 3 tries, get help right away.  After your blood sugar goes back to normal, eat a meal or a snack within 1 hour. Treating very low blood sugar If your blood sugar is at or below 54 mg/dL (3 mmol/L), you have very low blood sugar (severe hypoglycemia). This is an emergency. Do not wait to see if the symptoms will go away. Get medical help right away. Call your local emergency services (911 in the [place] Do not drive yourself to the hospital. Questions to ask your health care provider  Do I need to meet with a diabetes educator?  What equipment will I need to care for myself at home?  What diabetes medicines do I need? When should I take them?  How often do I need to check my blood sugar?  What number can I call if I have questions?  When is my next doctor's visit?  Where can I find a support group for people with diabetes? Where to find more information  American Diabetes Association: www.diabetes.org  American Association of Diabetes Educators: www.diabeteseducator.org/patient-resources Contact a doctor if:  Your blood sugar is at or above 240 mg/dL (13.3 mmol/L) for 2 days in a row.  You have been sick or have had a fever for 2 days or more, and you are not getting better.  You have any of these  problems for more than 6 hours: ? You cannot eat or drink. ? You feel sick to your stomach (nauseous). ? You throw up (vomit). ? You have watery poop (diarrhea). Get help right away if:  Your blood sugar is lower than 54 mg/dL (3 mmol/L).  You get confused.  You have trouble: ? Thinking clearly. ? Breathing. Summary  Diabetes (diabetes mellitus) is a long-term (chronic) disease. It occurs when the body does not properly use sugar (glucose) that is released from food after digestion.  Take insulin and diabetes medicines as told.  Check your blood sugar every day, as often as told.  Keep all follow-up visits as told by your doctor. This is important. This information is not intended to replace advice given to you by your health care provider. Make sure you discuss any questions you have with your health care provider. Document Revised: 11/03/2018 Document Reviewed: 05/15/2017 Elsevier Patient Education  2020 [employer].   Hypertension, Adult Hypertension is another name for high blood pressure. High blood pressure forces your heart to work harder to pump blood. This can cause problems over time. There are two numbers in a blood pressure reading. There is a top number (systolic) over a bottom number (diastolic). It is best to have a blood pressure that is below 120/80. Healthy choices can help lower your blood pressure, or you may need medicine to help lower it. What are the causes? The cause of this condition is not known. Some conditions may be related to high blood pressure. What increases the risk?  Smoking.  Having type 2 diabetes mellitus, high cholesterol, or both.  Not getting enough exercise or physical activity.  Being overweight.  Having too much fat, sugar, calories, or salt (sodium) in your diet.  Drinking too much alcohol.  Having long-term (chronic) kidney disease.  Having a family history of high blood  pressure.  Age. Risk increases with  age.  Race. You may be at higher risk if you are African American.  Gender. Men are at higher risk than women before age 45. After age 65, women are at higher risk than men.  Having obstructive sleep apnea.  Stress. What are the signs or symptoms?  High blood pressure may not cause symptoms. Very high blood pressure (hypertensive crisis) may cause: ? Headache. ? Feelings of worry or nervousness (anxiety). ? Shortness of breath. ? Nosebleed. ? A feeling of being sick to your stomach (nausea). ? Throwing up (vomiting). ? Changes in how you see. ? Very bad chest pain. ? Seizures. How is this treated?  This condition is treated by making healthy lifestyle changes, such as: ? Eating healthy foods. ? Exercising more. ? Drinking less alcohol.  Your health care provider may prescribe medicine if lifestyle changes are not enough to get your blood pressure under control, and if: ? Your top number is above 130. ? Your bottom number is above 80.  Your personal target blood pressure may vary. Follow these instructions at home: Eating and drinking   If told, follow the DASH eating plan. To follow this plan: ? Fill one half of your plate at each meal with fruits and vegetables. ? Fill one fourth of your plate at each meal with whole grains. Whole grains include whole-wheat pasta, brown rice, and whole-grain bread. ? Eat or drink low-fat dairy products, such as skim milk or low-fat yogurt. ? Fill one fourth of your plate at each meal with low-fat (lean) proteins. Low-fat proteins include fish, chicken without skin, eggs, beans, and tofu. ? Avoid fatty meat, cured and processed meat, or chicken with skin. ? Avoid pre-made or processed food.  Eat less than 1,500 mg of salt each day.  Do not drink alcohol if: ? Your doctor tells you not to drink. ? You are pregnant, may be pregnant, or are planning to become pregnant.  If you drink alcohol: ? Limit how much you use to:  0-1 drink a  day for women.  0-2 drinks a day for men. ? Be aware of how much alcohol is in your drink. In the [place] one drink equals one 12 oz bottle of beer (355 mL), one 5 oz glass of wine (148 mL), or one 1 oz glass of hard liquor (44 mL). Lifestyle   Work with your doctor to stay at a healthy weight or to lose weight. Ask your doctor what the best weight is for you.  Get at least 30 minutes of exercise most days of the week. This may include walking, swimming, or biking.  Get at least 30 minutes of exercise that strengthens your muscles (resistance exercise) at least 3 days a week. This may include lifting weights or doing Pilates.  Do not use any products that contain nicotine or tobacco, such as cigarettes, e-cigarettes, and chewing tobacco. If you need help quitting, ask your doctor.  Check your blood pressure at home as told by your doctor.  Keep all follow-up visits as told by your doctor. This is important. Medicines  Take over-the-counter and prescription medicines only as told by your doctor. Follow directions carefully.  Do not skip doses of blood pressure medicine. The medicine does not work as well if you skip doses. Skipping doses also puts you at risk for problems.  Ask your doctor about side effects or reactions to medicines that you should watch for. Contact a doctor if you:    Think you are having a reaction to the medicine you are taking.  Have headaches that keep coming back (recurring).  Feel dizzy.  Have swelling in your ankles.  Have trouble with your vision. Get help right away if you:  Get a very bad headache.  Start to feel mixed up (confused).  Feel weak or numb.  Feel faint.  Have very bad pain in your: ? Chest. ? Belly (abdomen).  Throw up more than once.  Have trouble breathing. Summary  Hypertension is another name for high blood pressure.  High blood pressure forces your heart to work harder to pump blood.  For most people, a normal  blood pressure is less than 120/80.  Making healthy choices can help lower blood pressure. If your blood pressure does not get lower with healthy choices, you may need to take medicine. This information is not intended to replace advice given to you by your health care provider. Make sure you discuss any questions you have with your health care provider. Document Revised: 10/21/2017 Document Reviewed: 10/21/2017 Elsevier Patient Education  2020 [employer]   Chronic Bronchitis, Adult Chronic bronchitis is long-lasting inflammation of the tubes that carry air into your lungs (bronchial tubes). This is inflammation that occurs:  On most days of the week.  For at least three months at a time.  Over a period of two years in a row. When the bronchial tubes are inflamed, they start to produce mucus. The inflammation and buildup of mucus make it more difficult to breathe. Chronic bronchitis is usually a permanent problem. It is one type of chronic obstructive pulmonary disease (COPD). People with chronic bronchitis are more likely to get frequent colds or respiratory infections. What are the causes? Chronic bronchitis most often occurs in people who:  Have chronic, severe asthma.  Have a history of smoking.  Have asthma and smoke.  Have certain lung diseases.  Have had long-term exposure to certain irritating fumes or chemicals. What are the signs or symptoms? Symptoms of chronic bronchitis may include:  A cough that brings up mucus (productive cough).  Shortness of breath.  Loud breathing (wheezing).  Chest discomfort.  Frequent (recurring) colds or respiratory infections. Certain things can trigger chronic bronchitis symptoms or make them worse, such as:  Infections.  Stopping certain medicines.  Smoking.  Exposure to chemicals. How is this diagnosed? This condition may be diagnosed based on:  Your symptoms and medical history.  A physical exam.  A chest  X-ray.  Lung (pulmonary) function tests. How is this treated? There is no cure for chronic bronchitis, but treatment can help control your symptoms. Treatment may include:  Using a cool mist vaporizer or humidifier to make it easier to breathe.  Drinking more fluids. Drinking more makes your mucus thinner, which may make it easier to breathe.  Lifestyle changes, such as eating a healthier diet and getting more exercise.  Medicines, such as: ? Inhalers to improve air flow in and out of your lungs. ? Antibiotics to treat any bacterial infections you have, such as:  Lung infection (pneumonia).  Sinus infection.  A sudden, severe (acute) episode of bronchitis.  Oxygen therapy.  Preventing infections by keeping up to date on vaccinations, including the pneumonia and flu vaccines.  Pulmonary rehabilitation. This is a program that helps you manage your breathing problems and improve your quality of life. It may last for up to 4-12 weeks and may include exercise programs, education, counseling, and treatment support. Follow these instructions  at home: Medicines  Take over-the-counter and prescription medicines only as told by your health care provider.  If you were prescribed an antibiotic medicine, take it as told by your health care provider. Do not stop taking the antibiotic even if you start to feel better. Preventing infections  Get vaccinations as told by your health care provider. Make sure you get a flu shot (influenza vaccine) every year.  Wash your hands often with soap and water. If soap and water are not available, use hand sanitizer.  Avoid contact with people who have symptoms of a cold or the flu. Managing symptoms   Do not smoke, and avoid secondhand smoke. Exposure to cigarette smoke or irritating chemicals will make bronchitis worse. If you smoke and you need help quitting, ask your health care provider. Quitting smoking will help your lungs heal faster.  Use an  inhaler, cool mist vaporizer, or humidifier as told by your health care provider.  Avoid pollen, dust, animal dander, molds, smoke, and other things that cause shortness of breath or wheezing attacks.  Use oxygen therapy at home as directed. Follow instructions from your health care provider about how to use oxygen safely and take precautions to prevent fire. Make sure you never smoke while using oxygen or allow others to smoke in your home.  Do not wait to get medical care if you have any concerning symptoms or trouble breathing. Waiting could cause permanent injury and may be life threatening. General instructions  Talk with your health care provider about what activities are safe for you and about possible exercise routines. Regular exercise is very important to help you feel better.  Drink enough fluids to keep your urine pale yellow.  Keep all follow-up visits as told by your health care provider. This is important. Contact a health care provider if:  You have coughing or shortness of breath that gets worse.  You have muscle aches.  You have chest pain.  Your mucus seems to get thicker.  Your mucus changes from clear or white to yellow, green, gray, or bloody. Get help right away if:  Your usual medicines do not stop your wheezing.  You have severe difficulty breathing. These symptoms may represent a serious problem that is an emergency. Do not wait to see if the symptoms will go away. Get medical help right away. Call your local emergency services (911 in the [place] Do not drive yourself to the hospital. Summary  Chronic bronchitis is long-lasting inflammation of the tubes that carry air into your lungs (bronchial tubes).  Chronic bronchitis is usually a permanent problem. It is one type of chronic obstructive pulmonary disease (COPD).  There is no cure for chronic bronchitis, but treatment can help control your symptoms.  Do not smoke, and avoid secondhand smoke.  Exposure to cigarette smoke or irritating chemicals will make bronchitis worse. This information is not intended to replace advice given to you by your health care provider. Make sure you discuss any questions you have with your health care provider. Document Revised: 12/03/2017 Document Reviewed: 12/31/2016 Elsevier Patient Education  2020 [employer] Inc[employer]  COVID-19 Vaccine Information can be found at: [employer] For questions related to vaccine distribution or appointments, please email [email or web address] or call [phone].   Encouraged to take multivitamin, vitamin d, vitamin c and zinc to increase immune system. Aware can call office if would like to have vaccine here at office.

## 2019-12-07 LAB — CMP14+EGFR
ALT: 25 IU/L (ref 0–32)
AST: 23 IU/L (ref 0–40)
Albumin/Globulin Ratio: 1.8 (ref 1.2–2.2)
Albumin: 4.3 g/dL (ref 3.8–4.9)
Alkaline Phosphatase: 73 IU/L (ref 44–121)
BUN/Creatinine Ratio: 15 (ref 12–28)
BUN: 13 mg/dL (ref 8–27)
Bilirubin Total: 0.2 mg/dL (ref 0.0–1.2)
CO2: 24 mmol/L (ref 20–29)
Calcium: 9.3 mg/dL (ref 8.7–10.3)
Chloride: 104 mmol/L (ref 96–106)
Creatinine, Ser: 0.84 mg/dL (ref 0.57–1.00)
GFR calc Af Amer: 87 mL/min/{1.73_m2} (ref >59)
GFR calc non Af Amer: 76 mL/min/{1.73_m2} (ref >59)
Globulin, Total: 2.4 g/dL (ref 1.5–4.5)
Glucose: 78 mg/dL (ref 65–99)
Potassium: 4.1 mmol/L (ref 3.5–5.2)
Sodium: 142 mmol/L (ref 134–144)
Total Protein: 6.7 g/dL (ref 6.0–8.5)

## 2019-12-07 LAB — HEMOGLOBIN A1C
Est. average glucose Bld gHb Est-mCnc: 126 mg/dL
Hgb A1c MFr Bld: 6 % — ABNORMAL HIGH (ref 4.8–5.6)

## 2019-12-07 LAB — LIPID PANEL
Chol/HDL Ratio: 1.9 ratio (ref 0.0–4.4)
Cholesterol, Total: 127 mg/dL (ref 100–199)
HDL: 68 mg/dL (ref >39)
LDL Chol Calc (NIH): 46 mg/dL (ref 0–99)
Triglycerides: 63 mg/dL (ref 0–149)
VLDL Cholesterol Cal: 13 mg/dL (ref 5–40)

## 2020-01-24 ENCOUNTER — Other Ambulatory Visit: Payer: Self-pay | Admitting: Nurse Practitioner

## 2020-01-25 NOTE — Telephone Encounter (Signed)
Cyclobenzaprine refill 

## 2020-02-06 ENCOUNTER — Other Ambulatory Visit: Payer: Self-pay

## 2020-02-06 MED ORDER — CYCLOBENZAPRINE HCL 10 MG PO TABS
10.0000 mg | ORAL_TABLET | Freq: Three times a day (TID) | ORAL | 0 refills | Status: DC
Start: 2020-02-06 — End: 2020-06-06

## 2020-02-23 ENCOUNTER — Encounter: Admitting: Nurse Practitioner

## 2020-02-29 ENCOUNTER — Other Ambulatory Visit: Payer: Self-pay

## 2020-03-12 ENCOUNTER — Institutional Professional Consult (permissible substitution): Admitting: Pulmonary Disease

## 2020-03-31 ENCOUNTER — Other Ambulatory Visit: Payer: Self-pay | Admitting: Nurse Practitioner

## 2020-05-01 ENCOUNTER — Other Ambulatory Visit: Payer: Self-pay | Admitting: Nurse Practitioner

## 2020-05-02 ENCOUNTER — Other Ambulatory Visit: Payer: Self-pay | Admitting: Nurse Practitioner

## 2020-05-03 ENCOUNTER — Ambulatory Visit (INDEPENDENT_AMBULATORY_CARE_PROVIDER_SITE_OTHER): Admitting: Nurse Practitioner

## 2020-05-03 ENCOUNTER — Other Ambulatory Visit: Payer: Self-pay

## 2020-05-03 ENCOUNTER — Encounter: Payer: Self-pay | Admitting: Nurse Practitioner

## 2020-05-03 VITALS — BP 140/90 | HR 80 | Temp 98.2°F | Ht 60.8 in | Wt 126.2 lb

## 2020-05-03 DIAGNOSIS — F419 Anxiety disorder, unspecified: Secondary | ICD-10-CM

## 2020-05-03 DIAGNOSIS — I10 Essential (primary) hypertension: Secondary | ICD-10-CM

## 2020-05-03 DIAGNOSIS — E119 Type 2 diabetes mellitus without complications: Secondary | ICD-10-CM | POA: Diagnosis not present

## 2020-05-03 DIAGNOSIS — Z720 Tobacco use: Secondary | ICD-10-CM

## 2020-05-03 DIAGNOSIS — R252 Cramp and spasm: Secondary | ICD-10-CM

## 2020-05-03 DIAGNOSIS — F32A Depression, unspecified: Secondary | ICD-10-CM

## 2020-05-03 DIAGNOSIS — E782 Mixed hyperlipidemia: Secondary | ICD-10-CM

## 2020-05-03 DIAGNOSIS — M797 Fibromyalgia: Secondary | ICD-10-CM

## 2020-05-03 DIAGNOSIS — F4321 Adjustment disorder with depressed mood: Secondary | ICD-10-CM | POA: Diagnosis not present

## 2020-05-03 DIAGNOSIS — R5383 Other fatigue: Secondary | ICD-10-CM

## 2020-05-03 MED ORDER — BUSPIRONE HCL 10 MG PO TABS: 10.0000 mg | ORAL_TABLET | Freq: Two times a day (BID) | ORAL | 1 refills | Status: DC

## 2020-05-03 MED ORDER — DULOXETINE HCL 60 MG PO CPEP: 60.0000 mg | ORAL_CAPSULE | Freq: Every day | ORAL | 1 refills | Status: DC

## 2020-05-03 MED ORDER — SITAGLIPTIN PHOSPHATE 25 MG PO TABS: 25.0000 mg | ORAL_TABLET | Freq: Every day | ORAL | 1 refills | Status: DC

## 2020-05-03 MED ORDER — MAGNESIUM 250 MG PO TABS: 1.0000 | ORAL_TABLET | Freq: Every day | ORAL | 1 refills | Status: DC

## 2020-05-03 MED ORDER — BUSPIRONE HCL 10 MG PO TABS: 10.0000 mg | ORAL_TABLET | Freq: Every day | ORAL | 1 refills | Status: DC

## 2020-05-03 MED ORDER — METFORMIN HCL ER 750 MG PO TB24: 750.0000 mg | ORAL_TABLET | Freq: Every day | ORAL | 0 refills | Status: DC

## 2020-05-03 NOTE — Progress Notes (Signed)
I,Yamilka Roman [employer] as a Education administrator for [employer], [name].,have documented all relevant documentation on the behalf of [name], [name],as directed by  [name], [name] while in the presence of [name], [place]. This visit occurred during the SARS-CoV-2 public health emergency.  Safety protocols were in place, including screening questions prior to the visit, additional usage of staff PPE, and extensive cleaning of exam room while observing appropriate contact time as indicated for disinfecting solutions.  Subjective:     Patient ID: [name] , female    DOB: 08/05/1960 , 60 y.o.   MRN: [id number]   Chief Complaint  Patient presents with  . Depression    Patient stated she has not been feeling too well since her husband passed away on 04/21/2022. She has not been able to eat, she feels nervous and jiterry.     HPI  Patient presents today because she is not feeling well overall since her husband passed away on 04-21-22 had CHF and COPD, had been sick for a long time.  He was a part of the hospice program and she is planning to participate.  She stated she has not been able to eat or sleep. She states "I just don't feel myself".  She is drinking the electrolyte water.  Insomnia Primary symptoms: sleep disturbance, no difficulty falling asleep, no frequent awakening.      Past Medical History:  Diagnosis Date  . Acute kidney failure with lesion of tubular necrosis ([place]) 11/11/2016  . Anxiety   . Back pain   . Diabetes mellitus   . Essential hypertension 11/11/2016  . Fibromyalgia   . High cholesterol   . Hypertension   . Syncope 11/11/2016     Family History  Problem Relation Age of Onset  . Hypertension Mother   . Heart failure Mother   . Diabetes Father   . COPD Father   . Emphysema Father   . Heart failure Maternal Grandmother   . Cancer Maternal Grandfather        LUNG   . Heart disease Maternal Grandfather   . Lung cancer Maternal Grandfather      Current  Outpatient Medications:  .  albuterol (VENTOLIN HFA) 108 (90 Base) MCG/ACT inhaler, INHALE 1 PUFF BY MOUTH EVERY 6 HOURS AS NEEDED FOR WHEEZING OR SHORTNESS OF BREATH, Disp: 9 g, Rfl: 0 .  alum & mag hydroxide-simeth (MAALOX/MYLANTA) 200-200-20 MG/5ML suspension, Take 15 mLs by mouth every 4 (four) hours as needed for indigestion or heartburn., Disp: 355 mL, Rfl: 0 .  aspirin 81 MG chewable tablet, Chew 81 mg by mouth daily., Disp: , Rfl:  .  cyclobenzaprine (FLEXERIL) 10 MG tablet, Take 1 tablet (10 mg total) by mouth 3 (three) times daily., Disp: 30 tablet, Rfl: 0 .  gabapentin (NEURONTIN) 300 MG capsule, Take 1 capsule (300 mg total) by mouth 3 (three) times daily., Disp: 180 capsule, Rfl: 1 .  HYDROcodone-homatropine (HYDROMET) 5-1.5 MG/5ML syrup, Take 5 mLs by mouth every 6 (six) hours as needed., Disp: 120 mL, Rfl: 0 .  Magnesium 250 MG TABS, Take 1 tablet (250 mg total) by mouth daily. Take with evening meal, Disp: 90 tablet, Rfl: 1 .  Melatonin 5 MG TABS, Take 1 tablet by mouth as needed. , Disp: , Rfl:  .  olmesartan (BENICAR) 5 MG tablet, Take 1 tablet by mouth once daily, Disp: 90 tablet, Rfl: 0 .  ondansetron (ZOFRAN) 4 MG tablet, Take 1 tablet (4 mg total) by mouth every  6 (six) hours as needed for nausea., Disp: 20 tablet, Rfl: 0 .  Tetrahydrozoline HCl (VISINE OP), Place 1 drop into both eyes daily., Disp: , Rfl:  .  tiotropium (SPIRIVA HANDIHALER) 18 MCG inhalation capsule, Place 1 capsule (18 mcg total) into inhaler and inhale daily., Disp: 30 capsule, Rfl: 2 .  blood glucose meter kit and supplies KIT, Dispense based on patient and insurance preference. Use up to four times daily as directed. (FOR ICD-9 250.00, 250.01). (Patient not taking: No sig reported), Disp: 1 each, Rfl: 0 .  busPIRone (BUSPAR) 10 MG tablet, Take 1 tablet (10 mg total) by mouth 2 (two) times daily., Disp: 180 tablet, Rfl: 1 .  DULoxetine (CYMBALTA) 60 MG capsule, Take 1 capsule (60 mg total) by mouth daily.,  Disp: 90 capsule, Rfl: 1 .  metFORMIN (GLUCOPHAGE-XR) 750 MG 24 hr tablet, Take 1 tablet (750 mg total) by mouth daily with breakfast., Disp: 90 tablet, Rfl: 0 .  rosuvastatin (CRESTOR) 40 MG tablet, Take 1 tablet (40 mg total) by mouth daily., Disp: 90 tablet, Rfl: 1 .  sitaGLIPtin (JANUVIA) 25 MG tablet, Take 1 tablet (25 mg total) by mouth daily., Disp: 90 tablet, Rfl: 1   Allergies  Allergen Reactions  . Tylenol [Acetaminophen] Itching  . Pollen Extract   . Shellfish Allergy Hives  . Peanut-Containing Drug Products Hives     Review of Systems  Constitutional: Positive for activity change, appetite change and fatigue.  Respiratory: Negative.   Cardiovascular: Negative.  Negative for chest pain, palpitations and leg swelling.  Neurological: Positive for headaches. Negative for dizziness.  Psychiatric/Behavioral: Positive for sleep disturbance. Negative for agitation. The patient has insomnia.      Today's Vitals   05/03/20 1022  BP: 140/90  Pulse: 80  Temp: 98.2 F (36.8 C)  TempSrc: Oral  Weight: 126 lb 3.2 oz (57.2 kg)  Height: 5' 0.8" (1.544 m)  PainSc: 0-No pain   Body mass index is 24 kg/m.   Objective:  Physical Exam Constitutional:      Appearance: Normal appearance. She is normal weight. She is ill-appearing.  Cardiovascular:     Rate and Rhythm: Normal rate and regular rhythm.     Pulses: Normal pulses.     Heart sounds: Normal heart sounds.  Pulmonary:     Effort: Pulmonary effort is normal.     Breath sounds: Normal breath sounds.  Abdominal:     General: Abdomen is flat. Bowel sounds are normal.     Palpations: Abdomen is soft.  Musculoskeletal:        General: Normal range of motion.     Cervical back: Normal range of motion and neck supple.  Skin:    General: Skin is warm and dry.     Capillary Refill: Capillary refill takes less than 2 seconds.  Neurological:     General: No focal deficit present.     Mental Status: She is alert and oriented  to person, place, and time.  Psychiatric:        Mood and Affect: Mood normal.        Behavior: Behavior normal.        Thought Content: Thought content normal.        Judgment: Judgment normal.         Assessment And Plan:     1. Depression, unspecified depression type  Recent loss of her husband, encouraged to go for grief counseling  2. Anxiety  Chronic, she has been without this  medication for several months  This may help with her new anxiety related to the loss of her husband - busPIRone (BUSPAR) 10 MG tablet; Take 1 tablet (10 mg total) by mouth 2 (two) times daily.  Dispense: 180 tablet; Refill: 1  3. Type 2 diabetes mellitus without complication, without long-term current use of insulin (HCC)  Chronic, controlled  Continue with current medications  Encouraged to limit intake of sugary foods and drinks  Encouraged to increase physical activity to 150 minutes per week as tolerated - CMP14+EGFR - Hemoglobin A1c - sitaGLIPtin (JANUVIA) 25 MG tablet; Take 1 tablet (25 mg total) by mouth daily.  Dispense: 90 tablet; Refill: 1 - metFORMIN (GLUCOPHAGE-XR) 750 MG 24 hr tablet; Take 1 tablet (750 mg total) by mouth daily with breakfast.  Dispense: 90 tablet; Refill: 0  4. Essential hypertension . B/P is slightly elevated today, unsure if this is related to her dealing with the loss of her husband.   . CMP ordered to check renal function.  . The importance of regular exercise and dietary modification was stressed to the patient.  - CMP14+EGFR  5. Mixed hyperlipidemia Chronic, controlled Continue with current medications - Lipid panel - CMP14+EGFR  6. Cramps, muscle, general  She is encouraged to take magnesium with evening meal - Magnesium 250 MG TABS; Take 1 tablet (250 mg total) by mouth daily. Take with evening meal  Dispense: 90 tablet; Refill: 1 - Magnesium  7. Tobacco abuse - CT CHEST LUNG CA SCREEN LOW DOSE W/O CM; Future  8. Fatigue due to  depression  Will check for metabolic causes  May be related to mood vs grief vs metabolic cause - Vitamin Q11 - TSH  9. Fibromyalgia - DULoxetine (CYMBALTA) 60 MG capsule; Take 1 capsule (60 mg total) by mouth daily.  Dispense: 90 capsule; Refill: 1  10. Grief  she is encouraged to go for grief counseling with hospice   Patient was given opportunity to ask questions. Patient verbalized understanding of the plan and was able to repeat key elements of the plan. All questions were answered to their satisfaction.  [name], [name]    I, [name], [name], have reviewed all documentation for this visit. The documentation on 05/03/20 for the exam, diagnosis, procedures, and orders are all accurate and complete.  THE PATIENT IS ENCOURAGED TO PRACTICE SOCIAL DISTANCING DUE TO THE COVID-19 PANDEMIC.

## 2020-05-04 LAB — CMP14+EGFR
ALT: 34 IU/L — ABNORMAL HIGH (ref 0–32)
AST: 44 IU/L — ABNORMAL HIGH (ref 0–40)
Albumin/Globulin Ratio: 1.6 (ref 1.2–2.2)
Albumin: 4.1 g/dL (ref 3.8–4.9)
Alkaline Phosphatase: 64 IU/L (ref 44–121)
BUN/Creatinine Ratio: 13 (ref 12–28)
BUN: 12 mg/dL (ref 8–27)
Bilirubin Total: 0.4 mg/dL (ref 0.0–1.2)
CO2: 20 mmol/L (ref 20–29)
Calcium: 8.9 mg/dL (ref 8.7–10.3)
Chloride: 102 mmol/L (ref 96–106)
Creatinine, Ser: 0.91 mg/dL (ref 0.57–1.00)
Globulin, Total: 2.6 g/dL (ref 1.5–4.5)
Glucose: 97 mg/dL (ref 65–99)
Potassium: 4.3 mmol/L (ref 3.5–5.2)
Sodium: 138 mmol/L (ref 134–144)
Total Protein: 6.7 g/dL (ref 6.0–8.5)
eGFR: 72 mL/min/{1.73_m2} (ref >59)

## 2020-05-04 LAB — LIPID PANEL
Chol/HDL Ratio: 2.8 ratio (ref 0.0–4.4)
Cholesterol, Total: 204 mg/dL — ABNORMAL HIGH (ref 100–199)
HDL: 72 mg/dL (ref >39)
LDL Chol Calc (NIH): 115 mg/dL — ABNORMAL HIGH (ref 0–99)
Triglycerides: 95 mg/dL (ref 0–149)
VLDL Cholesterol Cal: 17 mg/dL (ref 5–40)

## 2020-05-04 LAB — MAGNESIUM: Magnesium: 1.5 mg/dL — ABNORMAL LOW (ref 1.6–2.3)

## 2020-05-04 LAB — VITAMIN B12: Vitamin B-12: 391 pg/mL (ref 232–1245)

## 2020-05-04 LAB — TSH: TSH: 1.4 u[IU]/mL (ref 0.450–4.500)

## 2020-05-04 LAB — HEMOGLOBIN A1C
Est. average glucose Bld gHb Est-mCnc: 126 mg/dL
Hgb A1c MFr Bld: 6 % — ABNORMAL HIGH (ref 4.8–5.6)

## 2020-05-11 ENCOUNTER — Telehealth: Payer: Self-pay

## 2020-05-11 ENCOUNTER — Other Ambulatory Visit: Payer: Self-pay | Admitting: Nurse Practitioner

## 2020-05-11 DIAGNOSIS — Z1231 Encounter for screening mammogram for malignant neoplasm of breast: Secondary | ICD-10-CM

## 2020-05-11 NOTE — Telephone Encounter (Signed)
Patient called to see if she qualified for [employer], income qualifications were met, but patient stated that she has [employer]. Patient informed to contact her insurance company, should be number listed on the card, ask if mammograms are covered, if so, which facility is in network. If not covered, or has high deductible patient informed to call back. Patient verbalized understanding.

## 2020-05-15 ENCOUNTER — Ambulatory Visit
Admission: RE | Admit: 2020-05-15 | Discharge: 2020-05-15 | Disposition: A | Discharge status: Home / Self Care | Source: Ambulatory Visit | Attending: Nurse Practitioner | Admitting: Nurse Practitioner

## 2020-05-15 DIAGNOSIS — Z720 Tobacco use: Secondary | ICD-10-CM

## 2020-05-16 ENCOUNTER — Other Ambulatory Visit: Payer: Self-pay | Admitting: Nurse Practitioner

## 2020-05-16 DIAGNOSIS — I700 Atherosclerosis of aorta: Secondary | ICD-10-CM

## 2020-05-16 MED ORDER — ROSUVASTATIN CALCIUM 40 MG PO TABS: 40.0000 mg | ORAL_TABLET | Freq: Every day | ORAL | 1 refills | Status: DC

## 2020-05-25 ENCOUNTER — Encounter: Payer: Self-pay | Admitting: Internal Medicine

## 2020-05-25 ENCOUNTER — Other Ambulatory Visit: Payer: Self-pay

## 2020-05-25 ENCOUNTER — Ambulatory Visit (INDEPENDENT_AMBULATORY_CARE_PROVIDER_SITE_OTHER): Admitting: Internal Medicine

## 2020-05-25 DIAGNOSIS — F17210 Nicotine dependence, cigarettes, uncomplicated: Secondary | ICD-10-CM | POA: Diagnosis not present

## 2020-05-25 DIAGNOSIS — J449 Chronic obstructive pulmonary disease, unspecified: Secondary | ICD-10-CM | POA: Diagnosis not present

## 2020-05-25 LAB — CBC WITH DIFFERENTIAL/PLATELET
Basophils Absolute: 0.1 10*3/uL (ref 0.0–0.1)
Basophils Relative: 1.2 % (ref 0.0–3.0)
Eosinophils Absolute: 0.2 10*3/uL (ref 0.0–0.7)
Eosinophils Relative: 2.2 % (ref 0.0–5.0)
HCT: 40.2 % (ref 36.0–46.0)
Hemoglobin: 13.1 g/dL (ref 12.0–15.0)
Lymphocytes Relative: 22 % (ref 12.0–46.0)
Lymphs Abs: 1.6 10*3/uL (ref 0.7–4.0)
MCHC: 32.5 g/dL (ref 30.0–36.0)
MCV: 92.2 fl (ref 78.0–100.0)
Monocytes Absolute: 0.6 10*3/uL (ref 0.1–1.0)
Monocytes Relative: 7.7 % (ref 3.0–12.0)
Neutro Abs: 4.9 10*3/uL (ref 1.4–7.7)
Neutrophils Relative %: 66.9 % (ref 43.0–77.0)
Platelets: 354 10*3/uL (ref 150.0–400.0)
RBC: 4.36 Mil/uL (ref 3.87–5.11)
RDW: 13.8 % (ref 11.5–15.5)
WBC: 7.3 10*3/uL (ref 4.0–10.5)

## 2020-05-25 MED ORDER — STIOLTO RESPIMAT 2.5-2.5 MCG/ACT IN AERS: 2.0000 | INHALATION_SPRAY | Freq: Every day | RESPIRATORY_TRACT | 11 refills | Status: DC

## 2020-05-25 MED ORDER — STIOLTO RESPIMAT 2.5-2.5 MCG/ACT IN AERS: 2.0000 | INHALATION_SPRAY | Freq: Every day | RESPIRATORY_TRACT | 0 refills | Status: DC

## 2020-05-25 MED ORDER — FAMOTIDINE 20 MG PO TABS: ORAL_TABLET | ORAL | 11 refills | Status: DC

## 2020-05-25 MED ORDER — PANTOPRAZOLE SODIUM 40 MG PO TBEC: 40.0000 mg | DELAYED_RELEASE_TABLET | Freq: Every day | ORAL | 2 refills | Status: DC

## 2020-05-25 MED ORDER — PREDNISONE 10 MG PO TABS
ORAL_TABLET | ORAL | 0 refills | Status: DC
Start: 2020-05-25 — End: 2020-07-06

## 2020-05-25 NOTE — Assessment & Plan Note (Signed)
Active smoker - doe x 2020  - 05/25/2020  After extensive coaching inhaler device,  effectiveness =    75%> try stiolto plus max gerd rx   DDX of  difficult airways management almost all start with A and  include Adherence, Ace Inhibitors, Acid Reflux, Active Sinus Disease, Alpha 1 Antitripsin deficiency, Anxiety masquerading as Airways dz,  ABPA,  Allergy(esp in young), Aspiration (esp in elderly), Adverse effects of meds,  Active smoking or vaping, A bunch of PE's (a small clot burden can't cause this syndrome unless there is already severe underlying pulm or vascular dz with poor reserve) plus two Bs  = Bronchiectasis and Beta blocker use..and one C= CHF  Adherence is always the initial "prime suspect" and is a multilayered concern that requires a "trust but verify" approach in every patient - starting with knowing how to use medications, especially inhalers, correctly, keeping up with refills and understanding the fundamental difference between maintenance and prns vs those medications only taken for a very short course and then stopped and not refilled.  - see [facility] teaching above - return with all meds in hand using a trust but verify approach to confirm accurate Medication  Reconciliation The principal here is that until we are certain that the  patients are doing what we've asked, it makes no sense to ask them to do more.    Active smoking at top of the rest of the list > see sep a/p  ? Acid (or non-acid) GERD > always difficult to exclude as up to 75% of pts in some series report no assoc GI/ Heartburn symptoms> rec max (24h)  acid suppression and diet restrictions/ reviewed and instructions given in writing.   ? Adverse effects of DPI > d/c spiriva and use [facility] stiolto for now instead   ? Allergy/asthma > check profile, pred x 6 days and approp use of saba: Advised; I spent extra time with pt today reviewing appropriate use of albuterol for prn use on exertion with the following points: 1)  saba is for relief of sob that does not improve by walking a slower pace or resting but rather if the pt does not improve after trying this first. 2) If the pt is convinced, as many are, that saba helps recover from activity faster then it's easy to tell if this is the case by re-challenging : ie stop, take the inhaler, then p 5 minutes try the exact same activity (intensity of workload) that just caused the symptoms and see if they are substantially diminished or not after saba 3) if there is an activity that reproducibly causes the symptoms, try the saba 15 min before the activity on alternate days   If in fact the saba really does help, then fine to continue to use it prn but advised may need to look closer at the maintenance regimen being used to achieve better control of airways disease with exertion.    >>> f/u in 6 weeks with pfts on return

## 2020-05-25 NOTE — Patient Instructions (Signed)
Plan A = Automatic = Always=    stiolto 2 puff each am and stop spiriva Pantoprazole (protonix) 40 mg   Take  30-60 min before first meal of the day and Pepcid (famotidine)  20 mg one after supper until return to office - this is the best way to tell whether stomach acid is contributing to your problem.    Plan B = Backup (to supplement plan A, not to replace it) Only use your albuterol inhaler as a rescue medication to be used if you can't catch your breath by resting or doing a relaxed purse lip breathing pattern.  - The less you use it, the better it will work when you need it. - Ok to use the inhaler up to 2 puffs  every 4 hours if you must but call for appointment if use goes up over your usual need - Don't leave home without it !!  (think of it like the spare tire for your car)     GERD (REFLUX)  is an extremely common cause of respiratory symptoms just like yours , many times with no obvious heartburn at all.    It can be treated with medication, but also with lifestyle changes including elevation of the head of your bed (ideally with 6 -8inch blocks under the headboard of your bed),  Smoking cessation, avoidance of late meals, excessive alcohol, and avoid fatty foods, chocolate, peppermint, colas, red wine, and acidic juices such as orange juice.  NO MINT OR MENTHOL PRODUCTS SO NO COUGH DROPS  USE SUGARLESS CANDY INSTEAD (Jolley ranchers or Stover's or Life Savers) or even ice chips will also do - the key is to swallow to prevent all throat clearing. NO OIL BASED VITAMINS - use powdered substitutes.  Avoid fish oil when coughing.   Please remember to go to the  x-ray department  for your tests - we will call you with the results when they are available    Please schedule a follow up office visit in 6 weeks, call sooner if needed  PFTs on return

## 2020-05-25 NOTE — Assessment & Plan Note (Signed)
Counseled re importance of smoking cessation but did not meet time criteria for separate billing            Each maintenance medication was reviewed in detail including emphasizing most importantly the difference between maintenance and prns and under what circumstances the prns are to be triggered using an action plan format where appropriate.  Total time for H and P, chart review, counseling, reviewing [facility] device(s) and generating customized AVS unique to this office visit / same day charting = 32 min

## 2020-05-25 NOTE — Progress Notes (Signed)
[name], female    DOB: 1960/08/24, 60 y.o.   MRN: [id number]   Brief patient profile:  44 yobf active smoker widow of a veteran with  life long problems with seasonal allergies spring=fall took shots x 3 months around the year 2000 but did not help then onset doe and cough 2020 triggered by smells or activities referred to pulmonary clinic 05/25/2020 by Darleen Crocker NP on spriiva dpi and mostly bothered by dry daytime cough     History of Present Illness  05/25/2020  Pulmonary/ 1st office eval/Averey Trompeter  Chief Complaint  Patient presents with  . Consult    Sob and Cough started 2 years ago. Dry cough most of the time. Much worse at night when lying down. Using albuterol 2x a day. Also using spiriva handihaler, which is working well.    Dyspnea:   Can still do [employer] leaning on cart with goal of being able to walk faster Cough: dry cough esp at bedtime and during the night as well Sleep: bed is flat, lots of pillow  SABA use: three times  On gabapentin 300 tid over a year   No obvious day to day or daytime variability or assoc excess/ purulent sputum or mucus plugs or hemoptysis or cp or chest tightness, subjective wheeze or overt sinus or hb symptoms.    . Also denies any obvious fluctuation of symptoms with weather or environmental changes or other aggravating or alleviating factors except as outlined above   No unusual exposure hx or h/o childhood pna/ asthma or knowledge of premature birth.  Current Allergies, Complete Past Medical History, Past Surgical History, Family History, and Social History were reviewed in [employer] record.  ROS  The following are not active complaints unless bolded Hoarseness, sore throat, dysphagia, dental problems, itching, sneezing,  nasal congestion or discharge of excess mucus or purulent secretions, ear ache,   fever, chills, sweats, unintended wt loss or wt gain, classically pleuritic or exertional cp,  orthopnea pnd or  arm/hand swelling  or leg swelling, presyncope, palpitations, abdominal pain, anorexia, nausea, vomiting, diarrhea  or change in bowel habits or change in bladder habits, change in stools or change in urine, dysuria, hematuria,  rash, arthralgias, visual complaints, headache, numbness, weakness or ataxia or problems with walking or coordination,  change in mood or  memory.           Past Medical History:  Diagnosis Date  . Acute kidney failure with lesion of tubular necrosis ([place]) 11/11/2016  . Anxiety   . Back pain   . Diabetes mellitus   . Essential hypertension 11/11/2016  . Fibromyalgia   . High cholesterol   . Hypertension   . Syncope 11/11/2016    Outpatient Medications Prior to Visit  Medication Sig Dispense Refill  . albuterol (VENTOLIN HFA) 108 (90 Base) MCG/ACT inhaler INHALE 1 PUFF BY MOUTH EVERY 6 HOURS AS NEEDED FOR WHEEZING OR SHORTNESS OF BREATH 9 g 0  . alum & mag hydroxide-simeth (MAALOX/MYLANTA) 200-200-20 MG/5ML suspension Take 15 mLs by mouth every 4 (four) hours as needed for indigestion or heartburn. 355 mL 0  . aspirin 81 MG chewable tablet Chew 81 mg by mouth daily.    . blood glucose meter kit and supplies KIT Dispense based on patient and insurance preference. Use up to four times daily as directed. (FOR ICD-9 250.00, 250.01). 1 each 0  . busPIRone (BUSPAR) 10 MG tablet Take 1 tablet (10 mg total) by mouth 2 (  two) times daily. 180 tablet 1  . cyclobenzaprine (FLEXERIL) 10 MG tablet Take 1 tablet (10 mg total) by mouth 3 (three) times daily. 30 tablet 0  . DULoxetine (CYMBALTA) 60 MG capsule Take 1 capsule (60 mg total) by mouth daily. 90 capsule 1  . gabapentin (NEURONTIN) 300 MG capsule Take 1 capsule (300 mg total) by mouth 3 (three) times daily. 180 capsule 1  . HYDROcodone-homatropine (HYDROMET) 5-1.5 MG/5ML syrup Take 5 mLs by mouth every 6 (six) hours as needed. 120 mL 0  . Magnesium 250 MG TABS Take 1 tablet (250 mg total) by mouth daily. Take with evening  meal 90 tablet 1  . Melatonin 5 MG TABS Take 1 tablet by mouth as needed.     . metFORMIN (GLUCOPHAGE-XR) 750 MG 24 hr tablet Take 1 tablet (750 mg total) by mouth daily with breakfast. 90 tablet 0  . olmesartan (BENICAR) 5 MG tablet Take 1 tablet by mouth once daily 90 tablet 0  . ondansetron (ZOFRAN) 4 MG tablet Take 1 tablet (4 mg total) by mouth every 6 (six) hours as needed for nausea. 20 tablet 0  . rosuvastatin (CRESTOR) 40 MG tablet Take 1 tablet (40 mg total) by mouth daily. 90 tablet 1  . sitaGLIPtin (JANUVIA) 25 MG tablet Take 1 tablet (25 mg total) by mouth daily. 90 tablet 1  . Tetrahydrozoline HCl (VISINE OP) Place 1 drop into both eyes daily.    Marland Kitchen tiotropium (SPIRIVA HANDIHALER) 18 MCG inhalation capsule Place 1 capsule (18 mcg total) into inhaler and inhale daily. 30 capsule 2   No facility-administered medications prior to visit.     Objective:     BP 124/80 (BP Location: Left Arm, Cuff Size: Normal)   Pulse 82   Temp 98 F (36.7 C)   Ht 5' (1.524 m)   Wt 127 lb (57.6 kg)   SpO2 99%   BMI 24.80 kg/m   SpO2: 99 %   amb pleasant bf nad   HEENT : pt wearing mask not removed for exam due to covid - 19 concerns.   NECK :  without JVD/Nodes/TM/ nl carotid upstrokes bilaterally   LUNGS: no acc muscle use,  Min barrel  contour chest wall with bilateral  slightly decreased bs s audible wheeze and  without cough on insp or exp maneuvers and min  Hyperresonant  to  percussion bilaterally     CV:  RRR  no s3 or murmur or increase in P2, and no edema   ABD:  soft and nontender with pos end  insp Hoover's  in the supine position. No bruits or organomegaly appreciated, bowel sounds nl  MS:   Nl gait/  ext warm without deformities, calf tenderness, cyanosis or clubbing No obvious joint restrictions   SKIN: warm and dry without lesions    NEURO:  alert, approp, nl sensorium with  no motor or cerebellar deficits apparent.         I personally reviewed images and  agree with radiology impression as follows:   Chest LDSCT 05/15/20  RADS 2 Moderate centrilobular and paraseptal emphysema  Labs ordered 05/25/2020  :  allergy profile          Assessment   COPD ? stage with emphysema / actively smoking  Active smoker - doe x 2020  - 05/25/2020  After extensive coaching inhaler device,  effectiveness =    75%> try stiolto plus max gerd rx   DDX of  difficult airways management almost  all start with A and  include Adherence, Ace Inhibitors, Acid Reflux, Active Sinus Disease, Alpha 1 Antitripsin deficiency, Anxiety masquerading as Airways dz,  ABPA,  Allergy(esp in young), Aspiration (esp in elderly), Adverse effects of meds,  Active smoking or vaping, A bunch of PE's (a small clot burden can't cause this syndrome unless there is already severe underlying pulm or vascular dz with poor reserve) plus two Bs  = Bronchiectasis and Beta blocker use..and one C= CHF  Adherence is always the initial "prime suspect" and is a multilayered concern that requires a "trust but verify" approach in every patient - starting with knowing how to use medications, especially inhalers, correctly, keeping up with refills and understanding the fundamental difference between maintenance and prns vs those medications only taken for a very short course and then stopped and not refilled.  - see [facility] teaching above - return with all meds in hand using a trust but verify approach to confirm accurate Medication  Reconciliation The principal here is that until we are certain that the  patients are doing what we've asked, it makes no sense to ask them to do more.    Active smoking at top of the rest of the list > see sep a/p  ? Acid (or non-acid) GERD > always difficult to exclude as up to 75% of pts in some series report no assoc GI/ Heartburn symptoms> rec max (24h)  acid suppression and diet restrictions/ reviewed and instructions given in writing.   ? Adverse effects of DPI > d/c spiriva and use  [facility] stiolto for now instead   ? Allergy/asthma > check profile, pred x 6 days and approp use of saba: Advised; I spent extra time with pt today reviewing appropriate use of albuterol for prn use on exertion with the following points: 1) saba is for relief of sob that does not improve by walking a slower pace or resting but rather if the pt does not improve after trying this first. 2) If the pt is convinced, as many are, that saba helps recover from activity faster then it's easy to tell if this is the case by re-challenging : ie stop, take the inhaler, then p 5 minutes try the exact same activity (intensity of workload) that just caused the symptoms and see if they are substantially diminished or not after saba 3) if there is an activity that reproducibly causes the symptoms, try the saba 15 min before the activity on alternate days   If in fact the saba really does help, then fine to continue to use it prn but advised may need to look closer at the maintenance regimen being used to achieve better control of airways disease with exertion.    >>> f/u in 6 weeks with pfts on return      Cigarette smoker Counseled re importance of smoking cessation but did not meet time criteria for separate billing     Each maintenance medication was reviewed in detail including emphasizing most importantly the difference between maintenance and prns and under what circumstances the prns are to be triggered using an action plan format where appropriate.  Total time for H and P, chart review, counseling, reviewing [facility] device(s) and generating customized AVS unique to this office visit / same day charting = 32 min      Christinia Gully, MD 05/25/2020

## 2020-05-28 LAB — IGE: IgE (Immunoglobulin E), Serum: 427 kU/L — ABNORMAL HIGH (ref <=114)

## 2020-05-28 NOTE — Progress Notes (Signed)
Tried calling the pt and there was no answer and no option to leave msg.

## 2020-05-31 ENCOUNTER — Encounter: Payer: Self-pay | Admitting: *Deleted

## 2020-05-31 NOTE — Progress Notes (Signed)
Tried calling the pt and there was no answer and her VM not set up yet Letter mailed.

## 2020-06-06 ENCOUNTER — Other Ambulatory Visit: Payer: Self-pay | Admitting: Nurse Practitioner

## 2020-06-06 NOTE — Telephone Encounter (Signed)
Cyclobenzaprine refill 

## 2020-07-02 ENCOUNTER — Other Ambulatory Visit: Payer: Self-pay

## 2020-07-02 ENCOUNTER — Encounter: Payer: Self-pay | Admitting: Cardiology

## 2020-07-02 ENCOUNTER — Ambulatory Visit (INDEPENDENT_AMBULATORY_CARE_PROVIDER_SITE_OTHER): Admitting: Cardiology

## 2020-07-02 VITALS — BP 120/80 | HR 93 | Ht 60.0 in | Wt 135.2 lb

## 2020-07-02 DIAGNOSIS — R0789 Other chest pain: Secondary | ICD-10-CM | POA: Insufficient documentation

## 2020-07-02 DIAGNOSIS — Z716 Tobacco abuse counseling: Secondary | ICD-10-CM

## 2020-07-02 DIAGNOSIS — I700 Atherosclerosis of aorta: Secondary | ICD-10-CM | POA: Insufficient documentation

## 2020-07-02 DIAGNOSIS — E7800 Pure hypercholesterolemia, unspecified: Secondary | ICD-10-CM

## 2020-07-02 DIAGNOSIS — I10 Essential (primary) hypertension: Secondary | ICD-10-CM

## 2020-07-02 DIAGNOSIS — Z7189 Other specified counseling: Secondary | ICD-10-CM

## 2020-07-02 DIAGNOSIS — Z8249 Family history of ischemic heart disease and other diseases of the circulatory system: Secondary | ICD-10-CM

## 2020-07-02 DIAGNOSIS — I2510 Atherosclerotic heart disease of native coronary artery without angina pectoris: Secondary | ICD-10-CM | POA: Diagnosis not present

## 2020-07-02 NOTE — Assessment & Plan Note (Signed)
The patient was counseled on tobacco cessation today for 4 minutes.  Counseling included reviewing the risks of smoking tobacco products, how it impacts the patient's current medical diagnoses and different strategies for quitting.  Pharmacotherapy to aid in tobacco cessation was not prescribed today. 

## 2020-07-02 NOTE — Assessment & Plan Note (Addendum)
Rare, nonexertional, pinching. Better with massage, moving shoulders, etc. Related sometimes to lifting/position.  Counseled on red flag warning signs that need immediate medical attention.

## 2020-07-02 NOTE — Assessment & Plan Note (Signed)
At goal. Continue olmesartan.

## 2020-07-02 NOTE — Assessment & Plan Note (Addendum)
Had been well controlled in the past, elevated on March testing, had run out of statin at that time. Back to taking it, no issues. Goal LDL <70, had been at goal previously.

## 2020-07-02 NOTE — Assessment & Plan Note (Signed)
Noted on CT scan 04/2020. On aspirin and rosuvastatin. 

## 2020-07-02 NOTE — Assessment & Plan Note (Signed)
>>  ASSESSMENT AND PLAN FOR AORTIC ATHEROSCLEROSIS (HCC) WRITTEN ON 07/02/2020 10:54 AM BY Jodelle Red, MD  Noted on CT scan 04/2020. On aspirin and rosuvastatin.

## 2020-07-02 NOTE — Patient Instructions (Signed)
   Follow-Up: At [facility], you and your health needs are our priority.  As part of our continuing mission to provide you with exceptional heart care, we have created designated Provider Care Teams.  These Care Teams include your primary Cardiologist (physician) and Advanced Practice Providers (APPs -  Physician Assistants and Nurse Practitioners) who all work together to provide you with the care you need, when you need it.  We recommend signing up for the patient portal called "MyChart".  Sign up information is provided on this After Visit Summary.  MyChart is used to connect with patients for Virtual Visits (Telemedicine).  Patients are able to view lab/test results, encounter notes, upcoming appointments, etc.  Non-urgent messages can be sent to your provider as well.   To learn more about what you can do with MyChart, go to [email or web address].    Your next appointment:   2 year(s)  The format for your next appointment:   In Person  Provider:   Jodelle Red, MD

## 2020-07-02 NOTE — Assessment & Plan Note (Signed)
Noted on CT scan 04/2020. On aspirin and rosuvastatin.

## 2020-07-02 NOTE — Progress Notes (Addendum)
Cardiology Office Note:    Date:  07/02/2020   ID:  [name], DOB 10/19/1960, MRN [id number]  PCP:  [name], [name]  Cardiologist:  Buford Dresser, MD  Referring MD: [name], [name]   CC: new patient evaluation for aortic atherosclerosis and coronary artery calcification  History of Present Illness:    [name] is a 60 y.o. female with a hx of type II diabetes, essential hypertension, hyperlipidemia, fibromyalgia, and tobacco use who is seen as a new consult at the request of [name], [name] for the evaluation and management of aortic atherosclerosis and coronary artery calcification.  Cardiovascular risk factors:  Prior clinical ASCVD: none Comorbid conditions, including hypertension, hyperlipidemia, diabetes, chronic kidney disease: diabetes, not on insulin  Metabolic syndrome/Obesity: Highest adult weight 168 lbs Chronic inflammatory conditions: fibromyalgia, chronic back pain, chronic bronchitis Tobacco use history: Lately 1 ppd of cigarettes, had been less before recent stress. Started smoking about [age] years old. Quit during pregnancy. Family history: Mother died of CHF at 75. Maternal Grandmother died of CHF at 16. Maternal Grandfather died of CHF and lung disease. Brother has heart condition, possible heart attack. Prior cardiac testing and/or incidental findings on other testing (ie coronary calcium): CT results as noted below Exercise level: Constantly active (baby-sits grandchildren, studying criminology), Can climb 15-20 stairs before having shortness of breath and continues up the stairs slowly. Current diet: Eats out once a week, Mostly eats meals cooked by her sister at home.  Today: She is concerned about occasionally having a pinching sensation in her chest, that she states is possibly due to coughing too much or lifting something in a wrong way. This sensation does not occur often, and is a very quick duration. It always occurs in the  same location, and is relieved by movement and rubbing the area. She reports having shortness of breath after climbing 15-20 stairs, but is able to continue climbing at a slower rate. She denies any palpitations, syncope, dizziness, LE edema, or orthopnea.  Of note, when she coughs significantly, she occasionally gets short of breath. Her cough is sometimes productive.  Past Medical History:  Diagnosis Date  . Acute kidney failure with lesion of tubular necrosis ([place]) 11/11/2016  . Anxiety   . Back pain   . Diabetes mellitus   . Essential hypertension 11/11/2016  . Fibromyalgia   . High cholesterol   . Hypertension   . Syncope 11/11/2016    Past Surgical History:  Procedure Laterality Date  . HAND SURGERY    . HYSTEROSCOPY  11/07   D&C  . OOPHORECTOMY     BSO  . TUBAL LIGATION  1991  . VAGINAL HYSTERECTOMY  01/2008   LAVH BSO    Current Medications: Current Outpatient Medications on File Prior to Visit  Medication Sig  . albuterol (VENTOLIN HFA) 108 (90 Base) MCG/ACT inhaler INHALE 1 PUFF BY MOUTH EVERY 6 HOURS AS NEEDED FOR WHEEZING OR SHORTNESS OF BREATH  . alum & mag hydroxide-simeth (MAALOX/MYLANTA) 200-200-20 MG/5ML suspension Take 15 mLs by mouth every 4 (four) hours as needed for indigestion or heartburn.  Marland Kitchen aspirin 81 MG chewable tablet Chew 81 mg by mouth daily.  . blood glucose meter kit and supplies KIT Dispense based on patient and insurance preference. Use up to four times daily as directed. (FOR ICD-9 250.00, 250.01).  . busPIRone (BUSPAR) 10 MG tablet Take 1 tablet (10 mg total) by mouth 2 (two) times daily.  . cyclobenzaprine (FLEXERIL) 10 MG tablet  TAKE 1 TABLET BY MOUTH THREE TIMES DAILY  . DULoxetine (CYMBALTA) 60 MG capsule Take 1 capsule (60 mg total) by mouth daily.  . famotidine (PEPCID) 20 MG tablet One after supper  . gabapentin (NEURONTIN) 300 MG capsule Take 1 capsule (300 mg total) by mouth 3 (three) times daily.  Marland Kitchen HYDROcodone-homatropine (HYDROMET)  5-1.5 MG/5ML syrup Take 5 mLs by mouth every 6 (six) hours as needed.  . Magnesium 250 MG TABS Take 1 tablet (250 mg total) by mouth daily. Take with evening meal  . Melatonin 5 MG TABS Take 1 tablet by mouth as needed.   . metFORMIN (GLUCOPHAGE-XR) 750 MG 24 hr tablet Take 1 tablet (750 mg total) by mouth daily with breakfast.  . olmesartan (BENICAR) 5 MG tablet Take 1 tablet by mouth once daily  . ondansetron (ZOFRAN) 4 MG tablet Take 1 tablet (4 mg total) by mouth every 6 (six) hours as needed for nausea.  . pantoprazole (PROTONIX) 40 MG tablet Take 1 tablet (40 mg total) by mouth daily. Take 30-60 min before first meal of the day  . predniSONE (DELTASONE) 10 MG tablet Take  4 each am x 2 days,   2 each am x 2 days,  1 each am x 2 days and stop  . rosuvastatin (CRESTOR) 40 MG tablet Take 1 tablet (40 mg total) by mouth daily.  . sitaGLIPtin (JANUVIA) 25 MG tablet Take 1 tablet (25 mg total) by mouth daily.  . Tetrahydrozoline HCl (VISINE OP) Place 1 drop into both eyes daily.  . Tiotropium Bromide-Olodaterol (STIOLTO RESPIMAT) 2.5-2.5 MCG/ACT AERS Inhale 2 puffs into the lungs daily.   No current facility-administered medications on file prior to visit.     Allergies:   Tylenol [acetaminophen], Pollen extract, Shellfish allergy, and Peanut-containing drug products   Social History   Tobacco Use  . Smoking status: Current Every Day Smoker    Packs/day: 1.00    Years: 45.00    Pack years: 45.00    Types: Cigarettes  . Smokeless tobacco: Never Used  . Tobacco comment: 1 pack/ day months  Vaping Use  . Vaping Use: Never used  Substance Use Topics  . Alcohol use: Yes    Alcohol/week: 0.0 standard drinks    Comment: rare  . Drug use: No    Family History: family history includes COPD in her father; Cancer in her maternal grandfather; Diabetes in her father; Emphysema in her father; Heart disease in her maternal grandfather; Heart failure in her maternal grandmother and mother;  Hypertension in her mother; Lung cancer in her maternal grandfather.  ROS:   Please see the history of present illness.  Additional pertinent ROS: Constitutional: Negative for chills, fever, night sweats, unintentional weight loss  HENT: Negative for ear pain and hearing loss.   Eyes: Negative for loss of vision and eye pain.  Respiratory: Positive for shortness of breath, cough. Negative for wheezing.   Cardiovascular: See HPI. Gastrointestinal: Negative for abdominal pain, melena, and hematochezia.  Genitourinary: Negative for dysuria and hematuria.  Musculoskeletal: Positive for pinching sensation in chest. Negative for falls and myalgias.  Skin: Negative for itching and rash.  Neurological: Negative for focal weakness, focal sensory changes and loss of consciousness.  Endo/Heme/Allergies: Positive for bruises easily.     EKGs/Labs/Other Studies Reviewed:    The following studies were reviewed today: CT lung cancer screening 05/15/20 Cardiovascular: Aortic atherosclerosis. Normal heart size, without pericardial effusion. Lad coronary artery calcification.  11/19/2016 Lexiscan: Study Highlights:  Nuclear  stress EF: 64%.  The left ventricular ejection fraction is normal (55-65%).  There was no ST segment deviation noted during stress.  The study is normal.  This is a low risk study.   Normal resting and stress perfusion. No ischemia or infarction EF 64%   01/05/2017 Echo: Study Conclusions   - Left ventricle: The cavity size was normal. Systolic function was  normal. The estimated ejection fraction was in the range of 60%  to 65%. Wall motion was normal; there were no regional wall  motion abnormalities. Doppler parameters are consistent with  abnormal left ventricular relaxation (grade 1 diastolic  dysfunction). There was no evidence of elevated ventricular  filling pressure by Doppler parameters.  - Aortic valve: There was no regurgitation.  - Aortic  root: The aortic root was normal in size.  - Mitral valve: There was trivial regurgitation.  - Left atrium: The atrium was normal in size.  - Right ventricle: Systolic function was normal.  - Right atrium: The atrium was normal in size.  - Tricuspid valve: There was mild regurgitation.  - Pulmonary arteries: Systolic pressure was within the normal  range.  - Pericardium, extracardiac: There was no pericardial effusion.   01/05/2017 Telemetry Monitoring: Study Highlights: 30-day event Monitor  Only 19 days recorded  Quality: Fair.  Baseline artifact. Predominant rhythm: Sinus rhythm Average heart rate: 92 bpm Max heart rate: 148 bpm Min heart rate: 59 bpm  No arrhythmias noted  07/20/2017 Echo: Study Conclusions   - Left ventricle: The cavity size was normal. Systolic function was  normal. The estimated ejection fraction was in the range of 60%  to 65%. Wall motion was normal; there were no regional wall  motion abnormalities. Doppler parameters are consistent with  abnormal left ventricular relaxation (grade 1 diastolic  dysfunction). There was no evidence of elevated ventricular  filling pressure by Doppler parameters.  - Mitral valve: There was trivial regurgitation.  - Left atrium: The atrium was normal in size.  - Right ventricle: The cavity size was mildly dilated. Wall  thickness was normal. Systolic function was normal.  - Right atrium: The atrium was normal in size.  - Tricuspid valve: There was mild regurgitation.  - Pulmonary arteries: Systolic pressure was within the normal  range.  - Inferior vena cava: The vessel was normal in size.  - Pericardium, extracardiac: There was no pericardial effusion.  EKG:  EKG is personally reviewed.   07/02/2020: NSR.  Recent Labs: 05/03/2020: ALT 34; BUN 12; Creatinine, Ser 0.91; Magnesium 1.5; Potassium 4.3; Sodium 138; TSH 1.400 05/25/2020: Hemoglobin 13.1; Platelets 354.0  Recent Lipid Panel     Component Value Date/Time   CHOL 204 (H) 05/03/2020 1111   TRIG 95 05/03/2020 1111   HDL 72 05/03/2020 1111   CHOLHDL 2.8 05/03/2020 1111   CHOLHDL 2.8 02/23/2012 1118   VLDL 45 (H) 02/23/2012 1118   LDLCALC 115 (H) 05/03/2020 1111    Physical Exam:    VS:  BP 120/80 (BP Location: Right Arm, Patient Position: Prone, Cuff Size: Normal)   Pulse 93   Ht 5' (1.524 m)   Wt 135 lb 3.2 oz (61.3 kg)   BMI 26.40 kg/m     Wt Readings from Last 3 Encounters:  07/02/20 135 lb 3.2 oz (61.3 kg)  05/25/20 127 lb (57.6 kg)  05/03/20 126 lb 3.2 oz (57.2 kg)    GEN: Well nourished, well developed in no acute distress HEENT: Normal, moist mucous membranes NECK: No JVD CARDIAC:  regular rhythm, normal S1 and S2, no rubs or gallops. No murmur. VASCULAR: Radial and DP pulses 2+ bilaterally. No carotid bruits RESPIRATORY:  Clear to auscultation without rales, wheezing or rhonchi  ABDOMEN: Soft, non-tender, non-distended MUSCULOSKELETAL:  Ambulates independently SKIN: Warm and dry, no edema NEUROLOGIC:  Alert and oriented x 3. No focal neuro deficits noted. PSYCHIATRIC:  Normal affect    ASSESSMENT:    1. Aortic atherosclerosis ([place])   2. Essential hypertension   3. Tobacco abuse counseling   4. Cardiac risk counseling   5. Counseling on health promotion and disease prevention   6. Coronary artery calcification seen on CT scan   7. Atypical chest pain   8. Family history of heart disease   9. Pure hypercholesterolemia    PLAN:    Aortic atherosclerosis ([place]) Noted on CT scan 04/2020. On aspirin and rosuvastatin.  Coronary artery calcification seen on CT scan Noted on CT scan 04/2020. On aspirin and rosuvastatin.  Essential hypertension At goal. Continue olmesartan.  Atypical chest pain Rare, nonexertional, pinching. Better with massage, moving shoulders, etc. Related sometimes to lifting/position.  Counseled on red flag warning signs that need immediate medical  attention.  Family history of heart disease Mat gparents both with CHF, mother died of CHF at age 24 (only child). 1 brother, nonsmoker but unclear heart condition, had MI in the past. Children are healthy.  Pure hypercholesterolemia Had been well controlled in the past, elevated on March testing, had run out of statin at that time. Back to taking it, no issues. Goal LDL <70, had been at goal previously.  Tobacco abuse counseling The patient was counseled on tobacco cessation today for 4 minutes.  Counseling included reviewing the risks of smoking tobacco products, how it impacts the patient's current medical diagnoses and different strategies for quitting.  Pharmacotherapy to aid in tobacco cessation was not prescribed today.  Cardiac risk counseling and prevention recommendations: -recommend heart healthy/Mediterranean diet, with whole grains, fruits, vegetable, fish, lean meats, nuts, and olive oil. Limit salt. -recommend moderate walking, 3-5 times/week for 30-50 minutes each session. Aim for at least 150 minutes.week. Goal should be pace of 3 miles/hours, or walking 1.5 miles in 30 minutes -recommend avoidance of tobacco products. Avoid excess alcohol. -ASCVD risk score: The 10-year ASCVD risk score Mikey Bussing DC Brooke Bonito., et al., 2013) is: 21.5%   Values used to calculate the score:     Age: 68 years     Sex: Female     Is Non-Hispanic African American: Yes     Diabetic: Yes     Tobacco smoker: Yes     Systolic Blood Pressure: 347 mmHg     Is BP treated: Yes     HDL Cholesterol: 72 mg/dL     Total Cholesterol: 204 mg/dL    Plan for follow up: 2 years or sooner as needed  Buford Dresser, MD, PhD, [facility]    Medication Adjustments/Labs and Tests Ordered: Current medicines are reviewed at length with the patient today.  Concerns regarding medicines are outlined above.  Orders Placed This Encounter  Procedures  . EKG 12-Lead   No orders of the defined  types were placed in this encounter.   Patient Instructions    Follow-Up: At [facility], you and your health needs are our priority.  As part of our continuing mission to provide you with exceptional heart care, we have created designated Provider Care Teams.  These Care Teams include your primary Cardiologist (  physician) and Advanced Practice Providers (APPs -  Physician Assistants and Nurse Practitioners) who all work together to provide you with the care you need, when you need it.  We recommend signing up for the patient portal called "MyChart".  Sign up information is provided on this After Visit Summary.  MyChart is used to connect with patients for Virtual Visits (Telemedicine).  Patients are able to view lab/test results, encounter notes, upcoming appointments, etc.  Non-urgent messages can be sent to your provider as well.   To learn more about what you can do with MyChart, go to [email or web address].    Your next appointment:   2 year(s)  The format for your next appointment:   In Person  Provider:   Buford Dresser, MD       [facility],acting as a scribe for Buford Dresser, MD.,have documented all relevant documentation on the behalf of Buford Dresser, MD,as directed by  Buford Dresser, MD while in the presence of Buford Dresser, MD.  I, Buford Dresser, MD, have reviewed all documentation for this visit. The documentation on 07/02/20 for the exam, diagnosis, procedures, and orders are all accurate and complete.  Signed, Buford Dresser, MD PhD 07/02/2020 12:47 PM    [place]

## 2020-07-02 NOTE — Assessment & Plan Note (Signed)
Mat gparents both with CHF, mother died of CHF at age 61 (only child). 1 brother, nonsmoker but unclear heart condition, had MI in the past. Children are healthy.

## 2020-07-03 ENCOUNTER — Other Ambulatory Visit (HOSPITAL_COMMUNITY)
Admission: RE | Admit: 2020-07-03 | Discharge: 2020-07-03 | Disposition: A | Discharge status: Home / Self Care | Source: Ambulatory Visit | Attending: Internal Medicine | Admitting: Internal Medicine

## 2020-07-03 ENCOUNTER — Other Ambulatory Visit (HOSPITAL_COMMUNITY)

## 2020-07-03 DIAGNOSIS — Z01812 Encounter for preprocedural laboratory examination: Secondary | ICD-10-CM | POA: Insufficient documentation

## 2020-07-03 DIAGNOSIS — Z20822 Contact with and (suspected) exposure to covid-19: Secondary | ICD-10-CM | POA: Diagnosis not present

## 2020-07-03 LAB — SARS CORONAVIRUS 2 (TAT 6-24 HRS): SARS Coronavirus 2: NEGATIVE

## 2020-07-05 ENCOUNTER — Ambulatory Visit
Admission: RE | Admit: 2020-07-05 | Discharge: 2020-07-05 | Disposition: A | Discharge status: Home / Self Care | Source: Ambulatory Visit | Attending: Nurse Practitioner | Admitting: Nurse Practitioner

## 2020-07-05 ENCOUNTER — Other Ambulatory Visit: Payer: Self-pay

## 2020-07-05 DIAGNOSIS — Z1231 Encounter for screening mammogram for malignant neoplasm of breast: Secondary | ICD-10-CM

## 2020-07-06 ENCOUNTER — Encounter: Payer: Self-pay | Admitting: Internal Medicine

## 2020-07-06 ENCOUNTER — Ambulatory Visit (INDEPENDENT_AMBULATORY_CARE_PROVIDER_SITE_OTHER): Admitting: Internal Medicine

## 2020-07-06 DIAGNOSIS — J449 Chronic obstructive pulmonary disease, unspecified: Secondary | ICD-10-CM

## 2020-07-06 DIAGNOSIS — R058 Other specified cough: Secondary | ICD-10-CM | POA: Diagnosis not present

## 2020-07-06 DIAGNOSIS — F17210 Nicotine dependence, cigarettes, uncomplicated: Secondary | ICD-10-CM

## 2020-07-06 LAB — PULMONARY FUNCTION TEST
DL/VA % pred: 56 %
DL/VA: 2.43 ml/min/mmHg/L
DLCO cor % pred: 45 %
DLCO cor: 8.23 ml/min/mmHg
DLCO unc % pred: 45 %
DLCO unc: 8.15 ml/min/mmHg
FEF 25-75 Post: 1.52 L/sec
FEF 25-75 Pre: 1.56 L/sec
FEF2575-%Change-Post: -3 %
FEF2575-%Pred-Post: 82 %
FEF2575-%Pred-Pre: 85 %
FEV1-%Change-Post: 0 %
FEV1-%Pred-Post: 83 %
FEV1-%Pred-Pre: 83 %
FEV1-Post: 1.5 L
FEV1-Pre: 1.49 L
FEV1FVC-%Change-Post: -5 %
FEV1FVC-%Pred-Pre: 103 %
FEV6-%Change-Post: 6 %
FEV6-%Pred-Post: 87 %
FEV6-%Pred-Pre: 82 %
FEV6-Post: 1.92 L
FEV6-Pre: 1.81 L
FEV6FVC-%Pred-Post: 103 %
FEV6FVC-%Pred-Pre: 103 %
FVC-%Change-Post: 6 %
FVC-%Pred-Post: 84 %
FVC-%Pred-Pre: 79 %
FVC-Post: 1.92 L
FVC-Pre: 1.81 L
Post FEV1/FVC ratio: 78 %
Post FEV6/FVC ratio: 100 %
Pre FEV1/FVC ratio: 82 %
Pre FEV6/FVC Ratio: 100 %
RV % pred: 113 %
RV: 2.06 L
TLC % pred: 92 %
TLC: 4.18 L

## 2020-07-06 NOTE — Progress Notes (Signed)
[name], female    DOB: Feb 14, 1961, 60 y.o.   MRN: [id number]   Brief patient profile:  28 yobf active smoker widow of a veteran with  life long problems with seasonal allergies spring = fall took shots x 3 months around the year 2000 but did not help then onset doe and cough 2020 triggered by smells or activities referred to pulmonary clinic 05/25/2020 by Darleen Crocker NP on spriiva dpi and mostly bothered by dry daytime cough     History of Present Illness  05/25/2020  Pulmonary/ 1st office eval/[facility]  Cough x 2017/18  Chief Complaint  Patient presents with  . Consult    Sob and Cough started 2 years ago. Dry cough most of the time. Much worse at night when lying down. Using albuterol 2x a day. Also using spiriva handihaler, which is working well.    Dyspnea:   Can still do [employer] leaning on cart with goal of being able to walk faster Cough: dry cough esp at bedtime and during the night as well Sleep: bed is flat, lots of pillow  SABA use: three times  On gabapentin 300 tid over a year  rec Plan A = Automatic = Always=    stiolto 2 puff each am and stop spiriva Pantoprazole (protonix) 40 mg   Take  30-60 min before first meal of the day and Pepcid (famotidine)  20 mg one after supper until return to office   Prednisone 10 mg take  4 each am x 2 days,   2 each am x 2 days,  1 each am x 2 days and stop  Plan B = Backup (to supplement plan A, not to replace it) Only use your albuterol inhaler as a rescue medication   GERD diet   Please schedule a follow up office visit in 6 weeks, call sooner if needed  PFTs on return    07/06/2020  f/u ov/[facility]ert re: cough x 2017 with nl pts still smoking avg gabapentin 300 mg bid as forgets daytime doses  Chief Complaint  Patient presents with  . Follow-up    PFT's done today. Breathing is unchanged and her cough has not improved. She is using her albuterol inhaler 2 x daily on average.   Dyspnea: MMRC1 = can walk nl pace, flat grade, can't hurry  or go uphills or steps s sob   Cough: much better on prednisone but still coughing at hs  Sleeping: bed is flat  SABA use: twice since last ov  02: none  Covid status:   None  / refuses vaccines     No obvious day to day or daytime variability or assoc excess/ purulent sputum or mucus plugs or hemoptysis or cp or chest tightness, subjective wheeze or overt sinus or hb symptoms.     Also denies any obvious fluctuation of symptoms with weather or environmental changes or other aggravating or alleviating factors except as outlined above   No unusual exposure hx or h/o childhood pna/ asthma or knowledge of premature birth.  Current Allergies, Complete Past Medical History, Past Surgical History, Family History, and Social History were reviewed in [employer] record.  ROS  The following are not active complaints unless bolded Hoarseness, sore throat, dysphagia, dental problems, itching, sneezing,  nasal congestion or discharge of excess mucus or purulent secretions, ear ache,   fever, chills, sweats, unintended wt loss or wt gain, classically pleuritic or exertional cp,  orthopnea pnd or arm/hand swelling  or leg swelling, presyncope, palpitations, abdominal pain, anorexia, nausea, vomiting, diarrhea  or change in bowel habits or change in bladder habits, change in stools or change in urine, dysuria, hematuria,  rash, arthralgias, visual complaints, headache, numbness, weakness or ataxia or problems with walking or coordination,  change in mood or  memory.        Current Meds  Medication Sig  . albuterol (VENTOLIN HFA) 108 (90 Base) MCG/ACT inhaler INHALE 1 PUFF BY MOUTH EVERY 6 HOURS AS NEEDED FOR WHEEZING OR SHORTNESS OF BREATH  . alum & mag hydroxide-simeth (MAALOX/MYLANTA) 200-200-20 MG/5ML suspension Take 15 mLs by mouth every 4 (four) hours as needed for indigestion or heartburn.  Marland Kitchen aspirin 81 MG chewable tablet Chew 81 mg by mouth daily.  . blood glucose meter kit  and supplies KIT Dispense based on patient and insurance preference. Use up to four times daily as directed. (FOR ICD-9 250.00, 250.01).  . busPIRone (BUSPAR) 10 MG tablet Take 1 tablet (10 mg total) by mouth 2 (two) times daily.  . cyclobenzaprine (FLEXERIL) 10 MG tablet TAKE 1 TABLET BY MOUTH THREE TIMES DAILY  . DULoxetine (CYMBALTA) 60 MG capsule Take 1 capsule (60 mg total) by mouth daily.  . famotidine (PEPCID) 20 MG tablet One after supper  . gabapentin (NEURONTIN) 300 MG capsule Take 1 capsule (300 mg total) by mouth 3 (three) times daily.  . Magnesium 250 MG TABS Take 1 tablet (250 mg total) by mouth daily. Take with evening meal  . Melatonin 5 MG TABS Take 1 tablet by mouth as needed.   . metFORMIN (GLUCOPHAGE-XR) 750 MG 24 hr tablet Take 1 tablet (750 mg total) by mouth daily with breakfast.  . olmesartan (BENICAR) 5 MG tablet Take 1 tablet by mouth once daily  . ondansetron (ZOFRAN) 4 MG tablet Take 1 tablet (4 mg total) by mouth every 6 (six) hours as needed for nausea.  . pantoprazole (PROTONIX) 40 MG tablet Take 1 tablet (40 mg total) by mouth daily. Take 30-60 min before first meal of the day  . rosuvastatin (CRESTOR) 40 MG tablet Take 1 tablet (40 mg total) by mouth daily.  . sitaGLIPtin (JANUVIA) 25 MG tablet Take 1 tablet (25 mg total) by mouth daily.  . Tetrahydrozoline HCl (VISINE OP) Place 1 drop into both eyes daily.  . Tiotropium Bromide-Olodaterol (STIOLTO RESPIMAT) 2.5-2.5 MCG/ACT AERS Inhale 2 puffs into the lungs daily.                    Past Medical History:  Diagnosis Date  . Acute kidney failure with lesion of tubular necrosis ([place]) 11/11/2016  . Anxiety   . Back pain   . Diabetes mellitus   . Essential hypertension 11/11/2016  . Fibromyalgia   . High cholesterol   . Hypertension   . Syncope 11/11/2016       Objective:     Wt Readings from Last 3 Encounters:  07/06/20 135 lb 12.8 oz (61.6 kg)  07/02/20 135 lb 3.2 oz (61.3 kg)  05/25/20 127 lb  (57.6 kg)      Vital signs reviewed  07/06/2020  - Note at rest 02 sats  98% on RA   General appearance:    amb pleasant bf nad    HEENT : pt wearing mask not removed for exam due to covid - 19 concerns.   NECK :  without JVD/Nodes/TM/ nl carotid upstrokes bilaterally   LUNGS: no acc muscle use,  Min barrel  contour  chest wall with bilateral  slightly decreased bs s audible wheeze and  without cough on insp or exp maneuvers and min  Hyperresonant  to  percussion bilaterally     CV:  RRR  no s3 or murmur or increase in P2, and no edema   ABD:  soft and nontender with pos end  insp Hoover's  in the supine position. No bruits or organomegaly appreciated, bowel sounds nl  MS:   Nl gait/  ext warm without deformities, calf tenderness, cyanosis or clubbing No obvious joint restrictions   SKIN: warm and dry without lesions    NEURO:  alert, approp, nl sensorium with  no motor or cerebellar deficits apparent.       I personally reviewed images and agree with radiology impression as follows:   Chest LDSCT 05/15/20  RADS 2 Moderate centrilobular and paraseptal emphysema          Assessment

## 2020-07-06 NOTE — Patient Instructions (Addendum)
We will walk you today for a baseline but unless you are losing ground with your breathing or having to use a lot more albuterol it's ok to leave off the stiolto  The key is to stop smoking completely before smoking completely stops you!    For drainage / throat tickle try take CHLORPHENIRAMINE  4 mg  (Chlortab 4mg   at should be easiest to find in the green box)  take one every 4 hours as needed - available over the counter- may cause fore bedtime and see how you tolerate it before trying in daytime    We are referring you to allergy / Dr [employer] office   Follow up here is as needed

## 2020-07-06 NOTE — Progress Notes (Signed)
PFT done today. 

## 2020-07-07 ENCOUNTER — Encounter: Payer: Self-pay | Admitting: Internal Medicine

## 2020-07-07 DIAGNOSIS — R058 Other specified cough: Secondary | ICD-10-CM | POA: Insufficient documentation

## 2020-07-07 NOTE — Assessment & Plan Note (Addendum)
Active smoker - doe x 2020  - 05/25/2020  After extensive coaching inhaler device,  effectiveness =    75%> try stiolto plus max gerd rx  -  Allergy profile 05/25/20 >  Eos 0.2 /  IgE  427 > referred to Dr Kathyrn Lass office  07/06/2020  - 07/06/2020 added 1st gen H1 blockers per guidelines   - PFT's  07/06/2020  FEV1 1.50 (83 % ) ratio 0.78  p 0 % improvement from saba p 0 prior to study with DLCO  8.15 (45%) corrects to 2.43 (55%)  for alv volume and FV curve minimal concavity  - 07/06/2020 ok to wean off stiolto to see if any change ex tol or need for saba -  07/06/2020   Walked RA  2 laps @ approx 283ft each @ avg pace  stopped due to end of study no sob with  sats 94% at end  Does not actually meet criteria for copd so stiolto optional, needs to work on stop smoking (see separate a/p)          Each maintenance medication was reviewed in detail including emphasizing most importantly the difference between maintenance and prns and under what circumstances the prns are to be triggered using an action plan format where appropriate.  Total time for H and P, chart review, counseling, reviewing smi device(s) and generating customized AVS unique to this office visit / same day charting = 25 min

## 2020-07-07 NOTE — Assessment & Plan Note (Addendum)
Onset 2017 worse at hs/ partially responds to prednisone  -  Allergy profile 05/25/20 >  Eos 0.2 /  IgE  427     >>>  07/06/2020 added 1st gen H1 blockers per guidelines  And referred to allergy

## 2020-07-07 NOTE — Assessment & Plan Note (Signed)
4-5 min discussion re active cigarette smoking in addition to office E&M  Ask about tobacco use:   ongoing Advise quitting   > 3 min discussion I reviewed the Fletcher curve with the patient that basically indicates  if you quit smoking when your best day FEV1 is still well preserved (as is clearly  the case here)  it is highly unlikely you will progress to severe disease and informed the patient there was  no medication on the market that has proven to alter the curve/ its downward trajectory  or the likelihood of progression of their disease(unlike other chronic medical conditions such as atheroclerosis where we do think we can change the natural hx with risk reducing meds)    Therefore stopping smoking and maintaining abstinence are  the most important aspects of care, not choice of inhalers or for that matter, doctors.  Treatment other than smoking cessation  is entirely directed by severity of symptoms and focused also on reducing exacerbations, not attempting to change the natural history of the disease.   Assess willingness:  Not committed at this point Assist in quit attempt:  Per PCP when ready Arrange follow up:   Follow up per Primary Care planned        

## 2020-07-18 ENCOUNTER — Ambulatory Visit: Admitting: Nurse Practitioner

## 2020-08-06 ENCOUNTER — Ambulatory Visit: Admitting: Nurse Practitioner

## 2020-08-14 ENCOUNTER — Other Ambulatory Visit: Payer: Self-pay | Admitting: Nurse Practitioner

## 2020-08-23 ENCOUNTER — Encounter: Payer: Self-pay | Admitting: Nurse Practitioner

## 2020-08-23 ENCOUNTER — Ambulatory Visit (INDEPENDENT_AMBULATORY_CARE_PROVIDER_SITE_OTHER): Admitting: Nurse Practitioner

## 2020-08-23 ENCOUNTER — Other Ambulatory Visit: Payer: Self-pay

## 2020-08-23 VITALS — BP 126/82 | HR 91 | Temp 98.1°F | Ht 60.8 in | Wt 138.2 lb

## 2020-08-23 DIAGNOSIS — F32A Depression, unspecified: Secondary | ICD-10-CM

## 2020-08-23 DIAGNOSIS — R059 Cough, unspecified: Secondary | ICD-10-CM

## 2020-08-23 DIAGNOSIS — Z91018 Allergy to other foods: Secondary | ICD-10-CM

## 2020-08-23 DIAGNOSIS — R5383 Other fatigue: Secondary | ICD-10-CM | POA: Diagnosis not present

## 2020-08-23 DIAGNOSIS — Z2821 Immunization not carried out because of patient refusal: Secondary | ICD-10-CM

## 2020-08-23 DIAGNOSIS — R109 Unspecified abdominal pain: Secondary | ICD-10-CM

## 2020-08-23 DIAGNOSIS — M545 Low back pain, unspecified: Secondary | ICD-10-CM

## 2020-08-23 DIAGNOSIS — Z720 Tobacco use: Secondary | ICD-10-CM

## 2020-08-23 LAB — POCT URINALYSIS DIPSTICK
Bilirubin, UA: NEGATIVE
Blood, UA: NEGATIVE
Glucose, UA: NEGATIVE
Ketones, UA: NEGATIVE
Leukocytes, UA: NEGATIVE
Nitrite, UA: NEGATIVE
Protein, UA: NEGATIVE
Spec Grav, UA: 1.015 (ref 1.010–1.025)
Urobilinogen, UA: 0.2 E.U./dL
pH, UA: 5.5 (ref 5.0–8.0)

## 2020-08-23 MED ORDER — EPINEPHRINE 0.3 MG/0.3ML IJ SOAJ: 0.3000 mg | INTRAMUSCULAR | 2 refills | Status: DC | PRN

## 2020-08-23 MED ORDER — HYDROCODONE BIT-HOMATROP MBR 5-1.5 MG/5ML PO SOLN: 5.0000 mL | Freq: Four times a day (QID) | ORAL | 0 refills | Status: DC | PRN

## 2020-08-23 NOTE — Progress Notes (Signed)
I,Yamilka Roman [employer] as a Education administrator for [employer], [name].,have documented all relevant documentation on the behalf of [name], [name],as directed by  [name], [name] while in the presence of [name], [place].  This visit occurred during the SARS-CoV-2 public health emergency.  Safety protocols were in place, including screening questions prior to the visit, additional usage of staff PPE, and extensive cleaning of exam room while observing appropriate contact time as indicated for disinfecting solutions.  Subjective:     Patient ID: [name] , female    DOB: 01/25/1961 , 60 y.o.   MRN: [id number]   Chief Complaint  Patient presents with   Depression    HPI  Patient presents today for a depression f/u. Since being seen she is having more good days than bad days. She is taking buspar two times a day due to causing her drowsiness. She does feel like it is working.   Depression        This is a recurrent problem.  The current episode started more than 1 month ago.   The problem has been gradually improving since onset.  Associated symptoms include fatigue, insomnia, appetite change and headaches.  Compliance with treatment is good.  Risk factors: recent death of her husband.  Insomnia Primary symptoms: sleep disturbance, no difficulty falling asleep, no frequent awakening.   PMH includes: depression.     Past Medical History:  Diagnosis Date   Acute kidney failure with lesion of tubular necrosis ([place]) 11/11/2016   Anxiety    Back pain    Diabetes mellitus    Essential hypertension 11/11/2016   Fibromyalgia    High cholesterol    Hypertension    Syncope 11/11/2016     Family History  Problem Relation Age of Onset   Allergic rhinitis Mother    Hypertension Mother    Heart failure Mother    Allergic rhinitis Father    Diabetes Father    COPD Father    Emphysema Father    Heart failure Maternal Grandmother    Cancer Maternal Grandfather        LUNG    Heart  disease Maternal Grandfather    Lung cancer Maternal Grandfather    Bronchitis Daughter        TWICE YEARLY   Allergic rhinitis Daughter    Bronchitis Son        YEARLY   Allergic rhinitis Son    Food Allergy Grandchild    Asthma Neg Hx    Immunodeficiency Neg Hx    Eczema Neg Hx    Atopy Neg Hx    Angioedema Neg Hx      Current Outpatient Medications:    albuterol (VENTOLIN HFA) 108 (90 Base) MCG/ACT inhaler, INHALE 1 PUFF BY MOUTH EVERY 6 HOURS AS NEEDED FOR WHEEZING OR SHORTNESS OF BREATH, Disp: 9 g, Rfl: 0   alum & mag hydroxide-simeth (MAALOX/MYLANTA) 200-200-20 MG/5ML suspension, Take 15 mLs by mouth every 4 (four) hours as needed for indigestion or heartburn., Disp: 355 mL, Rfl: 0   aspirin 81 MG chewable tablet, Chew 81 mg by mouth daily., Disp: , Rfl:    blood glucose meter kit and supplies KIT, Dispense based on patient and insurance preference. Use up to four times daily as directed. (FOR ICD-9 250.00, 250.01)., Disp: 1 each, Rfl: 0   cyclobenzaprine (FLEXERIL) 10 MG tablet, TAKE 1 TABLET BY MOUTH THREE TIMES DAILY, Disp: 30 tablet, Rfl: 0   DULoxetine (CYMBALTA) 60 MG capsule, Take  1 capsule (60 mg total) by mouth daily., Disp: 90 capsule, Rfl: 1   EPINEPHrine 0.3 mg/0.3 mL IJ SOAJ injection, Inject 0.3 mg into the muscle as needed for anaphylaxis., Disp: 1 each, Rfl: 2   famotidine (PEPCID) 20 MG tablet, One after supper, Disp: 30 tablet, Rfl: 11   HYDROcodone bit-homatropine (HYDROMET) 5-1.5 MG/5ML syrup, Take 5 mLs by mouth every 6 (six) hours as needed for cough., Disp: 120 mL, Rfl: 0   Magnesium 250 MG TABS, Take 1 tablet (250 mg total) by mouth daily. Take with evening meal (Patient taking differently: Take 1 tablet by mouth as needed. Take with evening meal EVERY OTHER DAY), Disp: 90 tablet, Rfl: 1   Melatonin 5 MG TABS, Take 1 tablet by mouth as needed. , Disp: , Rfl:    metFORMIN (GLUCOPHAGE-XR) 750 MG 24 hr tablet, Take 1 tablet (750 mg total) by mouth daily with  breakfast., Disp: 90 tablet, Rfl: 0   rosuvastatin (CRESTOR) 40 MG tablet, Take 1 tablet (40 mg total) by mouth daily., Disp: 90 tablet, Rfl: 1   sitaGLIPtin (JANUVIA) 25 MG tablet, Take 1 tablet (25 mg total) by mouth daily., Disp: 90 tablet, Rfl: 1   Tetrahydrozoline HCl (VISINE OP), Place 1 drop into both eyes daily., Disp: , Rfl:    Tiotropium Bromide-Olodaterol (STIOLTO RESPIMAT) 2.5-2.5 MCG/ACT AERS, Inhale 2 puffs into the lungs daily., Disp: 1 each, Rfl: 11   azelastine (ASTELIN) 0.1 % nasal spray, Place 1-2 sprays into both nostrils 2 (two) times daily as needed (nasal drainage). Use in each nostril as directed, Disp: 30 mL, Rfl: 5   busPIRone (BUSPAR) 10 MG tablet, Take 1 tablet by mouth once daily, Disp: 90 tablet, Rfl: 0   fluticasone (FLONASE) 50 MCG/ACT nasal spray, Place 1 spray into both nostrils 2 (two) times daily as needed (nasal congestion)., Disp: 16 g, Rfl: 5   gabapentin (NEURONTIN) 300 MG capsule, TAKE 1 CAPSULE BY MOUTH THREE TIMES DAILY, Disp: 180 capsule, Rfl: 0   montelukast (SINGULAIR) 10 MG tablet, Take 1 tablet (10 mg total) by mouth at bedtime., Disp: 30 tablet, Rfl: 5   olmesartan (BENICAR) 5 MG tablet, Take 1 tablet by mouth once daily, Disp: 90 tablet, Rfl: 0   pantoprazole (PROTONIX) 40 MG tablet, TAKE 1 TABLET BY MOUTH ONCE DAILY(TAKE 30-60 MINUTES BEFORE FIRST MEAL OF THE DAY), Disp: 30 tablet, Rfl: 0   Allergies  Allergen Reactions   Tylenol [Acetaminophen] Itching   Pollen Extract    Shellfish Allergy Hives   Peanut-Containing Drug Products Hives     Review of Systems  Constitutional:  Positive for activity change, appetite change and fatigue.  Respiratory: Negative.    Cardiovascular: Negative.  Negative for chest pain, palpitations and leg swelling.  Genitourinary:        Groin pain   Musculoskeletal: Negative.        Flank pain   Neurological:  Positive for headaches. Negative for dizziness.  Psychiatric/Behavioral:  Positive for depression  and sleep disturbance. Negative for agitation. The patient has insomnia.     Today's Vitals   08/23/20 1149  BP: 126/82  Pulse: 91  Temp: 98.1 F (36.7 C)  Weight: 138 lb 3.2 oz (62.7 kg)  Height: 5' 0.8" (1.544 m)  PainSc: 7   PainLoc: Back   Body mass index is 26.28 kg/m.   Objective:  Physical Exam Constitutional:      Appearance: Normal appearance. She is normal weight. She is not ill-appearing.  Cardiovascular:  Rate and Rhythm: Normal rate and regular rhythm.     Pulses: Normal pulses.     Heart sounds: Normal heart sounds. No murmur heard. Pulmonary:     Effort: Pulmonary effort is normal. No respiratory distress.     Breath sounds: Normal breath sounds. No wheezing.  Musculoskeletal:        General: Normal range of motion.     Cervical back: Normal range of motion and neck supple.  Skin:    General: Skin is warm and dry.     Capillary Refill: Capillary refill takes less than 2 seconds.  Neurological:     General: No focal deficit present.     Mental Status: She is alert and oriented to person, place, and time.     Cranial Nerves: No cranial nerve deficit.     Motor: No weakness.  Psychiatric:        Mood and Affect: Mood normal.        Behavior: Behavior normal.        Thought Content: Thought content normal.        Judgment: Judgment normal.        Assessment And Plan:     1. Depression, unspecified depression type She is doing better since her last visit  2. COVID-19 vaccination declined Declines covid 19 vaccine. Discussed risk of covid 44 and if she changes her mind about the vaccine to call the office.  Encouraged to take multivitamin, vitamin d, vitamin c and zinc to increase immune system. Aware can call office if would like to have vaccine here at office.   3. Acute midline low back pain, unspecified whether sciatica present Encouraged to stretch regularly   4. Flank pain Urinalysis is normal - POCT Urinalysis Dipstick (81002)  5. Food  allergy - EPINEPHrine 0.3 mg/0.3 mL IJ SOAJ injection; Inject 0.3 mg into the muscle as needed for anaphylaxis.  Dispense: 1 each; Refill: 2  6. Tobacco abuse She is not on any medications to help decrease her smoking  7. Fatigue, unspecified type Will check iron studies - Iron, TIBC and Ferritin Panel  8. Cough Continues to have intermittent coughing will refill hydromet for cough She has already been referred to Pulmonology and the allergist - HYDROcodone bit-homatropine (HYDROMET) 5-1.5 MG/5ML syrup; Take 5 mLs by mouth every 6 (six) hours as needed for cough.  Dispense: 120 mL; Refill: 0    Patient was given opportunity to ask questions. Patient verbalized understanding of the plan and was able to repeat key elements of the plan. All questions were answered to their satisfaction.  [name], [name]   I, [name], [name], have reviewed all documentation for this visit. The documentation on 08/23/20 for the exam, diagnosis, procedures, and orders are all accurate and complete.   IF YOU HAVE BEEN REFERRED TO A SPECIALIST, IT MAY TAKE 1-2 WEEKS TO SCHEDULE/PROCESS THE REFERRAL. IF YOU HAVE NOT HEARD FROM US/SPECIALIST IN TWO WEEKS, PLEASE GIVE [place] A CALL AT [phone].   THE PATIENT IS ENCOURAGED TO PRACTICE SOCIAL DISTANCING DUE TO THE COVID-19 PANDEMIC.

## 2020-08-24 LAB — IRON,TIBC AND FERRITIN PANEL
Ferritin: 185 ng/mL — ABNORMAL HIGH (ref 15–150)
Iron Saturation: 17 % (ref 15–55)
Iron: 61 ug/dL (ref 27–159)
Total Iron Binding Capacity: 353 ug/dL (ref 250–450)
UIBC: 292 ug/dL (ref 131–425)

## 2020-09-11 NOTE — Progress Notes (Signed)
New Patient Note  RE: [name] MRN: [id number] DOB: 06/11/1960 Date of Office Visit: 09/12/2020  Consult requested by: Joyce Rockers, MD Primary care provider: [name], [name]  Chief Complaint: Cough (Worse at bedtime and sometimes in morning and having SOB )  History of Present Illness: I had the pleasure of seeing [name] for initial evaluation at the [facility] on 09/12/2020. She is a 60 y.o. female, who is referred here by Dr. Melvyn Novas (pulmonology) for the evaluation of COPD mixed type.  Respiratory:  She reports symptoms of shortness of breath, coughing with post tussive emesis, wheezing for 5+ years but worse the past 2 years. Current medications include Stiolto 2 puffs once a day x 3 months and albuterol prn which help. She tried the following inhalers: Spiriva. Main triggers are unknown but worse when laying down at night and around strange scents. In the last month, frequency of symptoms: 3-5x/week. Frequency of nocturnal symptoms: 0x/month. Frequency of SABA use: every other day. Sleep is undisturbed. In the last 12 months, emergency room visits/urgent care visits/doctor office visits or hospitalizations due to respiratory issues: 0. In the last 12 months, oral steroids courses: at least once. Lifetime history of hospitalization for respiratory issues: no. Prior intubations: no. History of pneumonia: no. She was evaluated by pulmonologist in the past. Smoking exposure: 1 pack per day x 30+ years. Up to date with flu vaccine: yes. Up to date with COVID-19 vaccine: no. Prior Covid-19 infection: no. History of reflux: sometimes.  Rhinitis: She reports symptoms of watery eyes, sneezing. Symptoms have been going on for 50+ years. The symptoms are present mainly in the spring and fall. Anosmia: sometimes. Headache: sometimes. She has used OTC antihistamines with fair improvement in symptoms. Sinus infections: not recently. Previous work up includes: not  recently and was on AIT but stopped as it caused some tingling sensation in the face and broke out in hives. Previous ENT evaluation: not recently. History of nasal polyps: no.  Food: She reports food allergy to shellfish and peanuts.  Shellfish causes tingling sensation on her face but she still eats it and takes benadryl beforehand. Some fish causes diarrhea.  Peanuts cause tingling and hives.   Sesame seeds and poppy seeds cause hives.   She does have access to epinephrine autoinjector and not needed to use it.   Past work up includes: not recently Dietary History: patient has been eating other foods including limited milk, eggs, treenuts- pistachios, pecans, fish, soy, wheat, meats, fruits and vegetables.  She reports reading labels and avoiding shellfish, peanuts, sesame in diet completely.   07/06/2020 pulmonology visit: "85 yobf active smoker widow of a veteran with  life long problems with seasonal allergies spring = fall took shots x 3 months around the year 2000 but did not help then onset doe and cough 2020 triggered by smells or activities referred to pulmonary clinic 05/25/2020 by Darleen Crocker NP on spriiva dpi and mostly bothered by dry daytime cough"  Component     Latest Ref Rng & Units 05/25/2020          WBC     4.0 - 10.5 K/uL 7.3  RBC     3.87 - 5.11 Mil/uL 4.36  Hemoglobin     12.0 - 15.0 g/dL 13.1  HCT     36.0 - 46.0 % 40.2  MCV     78.0 - 100.0 fl 92.2  MCH     26.6 -  33.0 pg   MCHC     30.0 - 36.0 g/dL 32.5  RDW     11.5 - 15.5 % 13.8  Platelets     150.0 - 400.0 K/uL 354.0  Neutrophils     43.0 - 77.0 % 66.9  NEUT#     1.4 - 7.7 K/uL 4.9  Lymphocytes     12.0 - 46.0 % 22.0  Lymphocyte #     0.7 - 4.0 K/uL 1.6  Monocytes Relative     3.0 - 12.0 % 7.7  Monocyte #     0.1 - 1.0 K/uL 0.6  Eosinophil     0.0 - 5.0 % 2.2  Eosinophils Absolute     0.0 - 0.7 K/uL 0.2  Basophil     0.0 - 3.0 % 1.2  Basophils Absolute     0.0 - 0.1 K/uL 0.1    Component     Latest Ref Rng & Units 05/25/2020  IgE (Immunoglobulin E), Serum     <OR=114 kU/L 427 (H)    Assessment and Plan: [name] is a 60 y.o. female with: Asthma-COPD overlap syndrome ([place]) 30+ year smoking history and follows with pulmonology. Noted worsening respiratory symptoms the past 2 years. Currently on Stiolto 2 puffs once a day and using albuterol every other day with good benefit. Takes PPI for reflux. eos 200, IgE 427. Today's spirometry showed: restrictive disease with 11% improvement in FEV1 post bronchodilator treatment. Clinically feeling improved.  Based on clinical history, she most likely has asthma-COPD overlap syndrome.  Daily controller medication(s): HOLD Stiolto START Trelegy 196mg 1 puff once a day and rinse mouth after each use. Demonstrated proper use.  Sample given.  May use albuterol rescue inhaler 2 puffs every 4 to 6 hours as needed for shortness of breath, chest tightness, coughing, and wheezing. May use albuterol rescue inhaler 2 puffs 5 to 15 minutes prior to strenuous physical activities. Monitor frequency of use.  Get spirometry at next visit. If no improvement, consider starting biologics.   Tobacco user Encouraged smoking cessation.  Other allergic rhinitis Rhino conjunctivitis symptoms mainly in the spring and fall for 50+ years. Tried OTC antihistamines with good benefit. On AIT briefly but stopped due to hives and tingling sensation in the face. Today's skin testing showed: Positive to grass, ragweed, dust mites and feathers.  Start environmental control measures as below. Start Singulair (montelukast) 146mdaily at night. Cautioned that in some children/adults can experience behavioral changes including hyperactivity, agitation, depression, sleep disturbances and suicidal ideations. These side effects are rare, but if you notice them you should notify me and discontinue Singulair (montelukast). Use over the counter antihistamines such as  Zyrtec (cetirizine), Claritin (loratadine), Allegra (fexofenadine), or Xyzal (levocetirizine) daily as needed. May take twice a day during allergy flares. May switch antihistamines every few months. Use azelastine nasal spray 1-2 sprays per nostril twice a day as needed for runny nose/drainage. Use Flonase (fluticasone) nasal spray 1 spray per nostril twice a day as needed for nasal congestion.   Other adverse food reactions, not elsewhere classified, subsequent encounter Shellfish caused tingling on her face, some finned fish cause diarrhea, peanuts cause tingling and hives, sesame seeds and poppy seeds cause hives. Today's skin testing was negative to select foods.  Continue to avoid shellfish, peanuts, sesame seeds, poppy seeds. Get bloodwork as below.  For mild symptoms you can take over the counter antihistamines such as Benadryl and monitor symptoms closely. If symptoms worsen or if you have severe  symptoms including breathing issues, throat closure, significant swelling, whole body hives, severe diarrhea and vomiting, lightheadedness then inject epinephrine and seek immediate medical care afterwards. Action plan given.   Heartburn Continue lifestyle and dietary modifications. Continue Protonix 15m daily as before. Nothing to eat/drink for 30 minutes afterwards.   Return in about 4 weeks (around 10/10/2020).  Meds ordered this encounter  Medications   montelukast (SINGULAIR) 10 MG tablet    Sig: Take 1 tablet (10 mg total) by mouth at bedtime.    Dispense:  30 tablet    Refill:  5   fluticasone (FLONASE) 50 MCG/ACT nasal spray    Sig: Place 1 spray into both nostrils 2 (two) times daily as needed (nasal congestion).    Dispense:  16 g    Refill:  5   azelastine (ASTELIN) 0.1 % nasal spray    Sig: Place 1-2 sprays into both nostrils 2 (two) times daily as needed (nasal drainage). Use in each nostril as directed    Dispense:  30 mL    Refill:  5    Lab Orders  Allergen Profile,  Shellfish  Allergen Profile, Food-Fish  IgE Nut Prof. w/Component Rflx  Allergen Sesame f10  F224-[id number] Poppy Seed    Other allergy screening: Medication allergy: yes Tylenol with codeine - itching No issues with tylenol alone.  Hymenoptera allergy: no Urticaria: no Eczema:no History of recurrent infections suggestive of immunodeficency: no  Diagnostics: Spirometry:  Tracings reviewed. Her effort: Good reproducible efforts. FVC: 1.58L FEV1: 1.18L, 68% predicted FEV1/FVC ratio: 75% Interpretation: Spirometry consistent with possible restrictive disease with 11% improvement in FEV1 post bronchodilator treatment. Clinically feeling improved.   Please see scanned spirometry results for details.  Skin Testing: Environmental allergy panel and select foods. Positive to grass, ragweed, dust mites and feathers.  Negative to foods. Results discussed with patient/family.  Airborne Adult Perc - 09/12/20 1435     Time Antigen Placed 1435    Allergen Manufacturer GLavella Hammock   Location Back    Number of Test 59    1. Control-Buffer 50% Glycerol Negative    2. Control-Histamine 1 mg/ml 2+    3. Albumin saline Negative    4. BJenningsNegative    5. BGuatemalaNegative    6. Johnson Negative    7. Kentucky Blue 2+    8. Meadow Fescue Negative    9. Perennial Rye 3+    10. Sweet Vernal 3+    11. Timothy Negative    12. Cocklebur Negative    13. Burweed Marshelder Negative    14. Ragweed, short 2+    15. Ragweed, Giant Negative    16. Plantain,  English Negative    17. Lamb's Quarters Negative    18. Sheep Sorrell Negative    19. Rough Pigweed Negative    20. Marsh Elder, Rough Negative    21. Mugwort, Common Negative    22. Ash mix Negative    23. Birch mix Negative    24. Beech American Negative    25. Box, Elder Negative    26. Cedar, red Negative    27. Cottonwood, ERussian FederationNegative    28. Elm mix Negative    29. Hickory Negative    30. Maple mix Negative    31. Oak, ERussian Federationmix  Negative    32. Pecan Pollen Negative    33. Pine mix Negative    34. Sycamore Eastern Negative    35. WWayne Black Pollen Negative    36.  Alternaria alternata Negative    37. Cladosporium Herbarum Negative    38. Aspergillus mix Negative    39. Penicillium mix Negative    40. Bipolaris sorokiniana (Helminthosporium) Negative    41. Drechslera spicifera (Curvularia) Negative    42. Mucor plumbeus Negative    43. Fusarium moniliforme Negative    44. Aureobasidium pullulans (pullulara) Negative    45. Rhizopus oryzae Negative    46. Botrytis cinera Negative    47. Epicoccum nigrum Negative    48. Phoma betae Negative    49. Candida Albicans Negative    50. Trichophyton mentagrophytes Negative    51. Mite, D Farinae  5,000 AU/ml 3+    52. Mite, D Pteronyssinus  5,000 AU/ml Negative    53. Cat Hair 10,000 BAU/ml Negative    54.  Dog Epithelia Negative    55. Mixed Feathers 2+    56. Horse Epithelia Negative    57. Cockroach, German Negative    58. Mouse Negative    59. Tobacco Leaf Negative             Intradermal - 09/12/20 1458     Time Antigen Placed 1458    Allergen Manufacturer Lavella Hammock    Location Arm    Number of Test 12    Control Negative    Guatemala Negative    Johnson Negative    Weed mix Negative    Tree mix Negative    Mold 1 Negative    Mold 2 Negative    Mold 3 Negative    Mold 4 Negative    Cat Negative    Dog Negative    Cockroach Negative             Food Adult Perc - 09/12/20 1400     Time Antigen Placed 1435    Allergen Manufacturer Greer    Location Back    Number of allergen test 29    1. Peanut Negative    2. Soybean Negative    3. Wheat Negative    4. Sesame Negative    5. Milk, cow Negative    6. Egg White, Chicken Negative    7. Casein Negative    8. Shellfish Mix Negative    9. Fish Mix Negative    10. Cashew Negative    11. Pecan Food Negative    12. Edgewood Negative    13. Almond Negative    14. Hazelnut Negative     15. [place] nut Negative    16. Coconut Negative    17. Pistachio Negative    18. Catfish Negative    19. Bass Negative    20. Trout Negative    21. Tuna Negative    22. Salmon Negative    23. Flounder Negative    24. Codfish Negative    25. Shrimp Negative    26. Crab Negative    27. Lobster Negative    28. Oyster Negative    29. Scallops Negative             Past Medical History: Patient Active Problem List   Diagnosis Date Noted   Asthma-COPD overlap syndrome ([place]) 09/12/2020   Other allergic rhinitis 09/12/2020   Tobacco user 09/12/2020   Other adverse food reactions, not elsewhere classified, subsequent encounter 09/12/2020   Heartburn 09/12/2020   Upper airway cough syndrome 07/07/2020   Aortic atherosclerosis ([place]) 07/02/2020   Tobacco abuse counseling 07/02/2020   Coronary artery calcification seen on CT scan 07/02/2020  Atypical chest pain 07/02/2020   Family history of heart disease 07/02/2020   Pure hypercholesterolemia 07/02/2020   COPD 0 stage with emphysema  on CT/ actively smoking  05/25/2020   Cigarette smoker 05/25/2020   Screening breast examination 10/14/2018   Epistaxis 04/28/2018   Prediabetes 04/28/2018   Anxiety 07/31/2017   Low back pain 07/31/2017   Sinus tachycardia 07/31/2017   Dyspnea 07/20/2017   Hypomagnesemia 07/20/2017   Elevated lipase 07/20/2017   Elevated LFTs 07/20/2017   Alcohol abuse 07/20/2017   Dehydration 07/19/2017   Pancreatitis 07/19/2017   Syncope 11/11/2016   Essential hypertension 11/11/2016   Shoulder pain 08/26/2011   Fibromyalgia    LATERAL EPICONDYLITIS, BILATERAL 05/25/2008   STRESS FRACTURE, FOOT 05/25/2008   Past Medical History:  Diagnosis Date   Acute kidney failure with lesion of tubular necrosis ([place]) 11/11/2016   Anxiety    Back pain    Diabetes mellitus    Essential hypertension 11/11/2016   Fibromyalgia    High cholesterol    Hypertension    Syncope 11/11/2016   Past Surgical  History: Past Surgical History:  Procedure Laterality Date   HAND SURGERY     HYSTEROSCOPY  11/07   D&C   OOPHORECTOMY     BSO   TUBAL LIGATION  1991   VAGINAL HYSTERECTOMY  01/2008   LAVH BSO   Medication List:  Current Outpatient Medications  Medication Sig Dispense Refill   albuterol (VENTOLIN HFA) 108 (90 Base) MCG/ACT inhaler INHALE 1 PUFF BY MOUTH EVERY 6 HOURS AS NEEDED FOR WHEEZING OR SHORTNESS OF BREATH 9 g 0   alum & mag hydroxide-simeth (MAALOX/MYLANTA) 200-200-20 MG/5ML suspension Take 15 mLs by mouth every 4 (four) hours as needed for indigestion or heartburn. 355 mL 0   aspirin 81 MG chewable tablet Chew 81 mg by mouth daily.     azelastine (ASTELIN) 0.1 % nasal spray Place 1-2 sprays into both nostrils 2 (two) times daily as needed (nasal drainage). Use in each nostril as directed 30 mL 5   blood glucose meter kit and supplies KIT Dispense based on patient and insurance preference. Use up to four times daily as directed. (FOR ICD-9 250.00, 250.01). 1 each 0   busPIRone (BUSPAR) 10 MG tablet Take 1 tablet (10 mg total) by mouth 2 (two) times daily. 180 tablet 1   cyclobenzaprine (FLEXERIL) 10 MG tablet TAKE 1 TABLET BY MOUTH THREE TIMES DAILY 30 tablet 0   DULoxetine (CYMBALTA) 60 MG capsule Take 1 capsule (60 mg total) by mouth daily. 90 capsule 1   EPINEPHrine 0.3 mg/0.3 mL IJ SOAJ injection Inject 0.3 mg into the muscle as needed for anaphylaxis. 1 each 2   famotidine (PEPCID) 20 MG tablet One after supper 30 tablet 11   fluticasone (FLONASE) 50 MCG/ACT nasal spray Place 1 spray into both nostrils 2 (two) times daily as needed (nasal congestion). 16 g 5   gabapentin (NEURONTIN) 300 MG capsule Take 1 capsule (300 mg total) by mouth 3 (three) times daily. 180 capsule 1   HYDROcodone bit-homatropine (HYDROMET) 5-1.5 MG/5ML syrup Take 5 mLs by mouth every 6 (six) hours as needed for cough. 120 mL 0   Magnesium 250 MG TABS Take 1 tablet (250 mg total) by mouth daily. Take with  evening meal (Patient taking differently: Take 1 tablet by mouth as needed. Take with evening meal EVERY OTHER DAY) 90 tablet 1   Melatonin 5 MG TABS Take 1 tablet by mouth as needed.  metFORMIN (GLUCOPHAGE-XR) 750 MG 24 hr tablet Take 1 tablet (750 mg total) by mouth daily with breakfast. 90 tablet 0   montelukast (SINGULAIR) 10 MG tablet Take 1 tablet (10 mg total) by mouth at bedtime. 30 tablet 5   olmesartan (BENICAR) 5 MG tablet Take 1 tablet by mouth once daily 90 tablet 0   pantoprazole (PROTONIX) 40 MG tablet Take 1 tablet (40 mg total) by mouth daily. Take 30-60 min before first meal of the day 30 tablet 2   rosuvastatin (CRESTOR) 40 MG tablet Take 1 tablet (40 mg total) by mouth daily. 90 tablet 1   sitaGLIPtin (JANUVIA) 25 MG tablet Take 1 tablet (25 mg total) by mouth daily. 90 tablet 1   Tetrahydrozoline HCl (VISINE OP) Place 1 drop into both eyes daily.     Tiotropium Bromide-Olodaterol (STIOLTO RESPIMAT) 2.5-2.5 MCG/ACT AERS Inhale 2 puffs into the lungs daily. 1 each 11   No current facility-administered medications for this visit.   Allergies: Allergies  Allergen Reactions   Tylenol [Acetaminophen] Itching   Pollen Extract    Shellfish Allergy Hives   Peanut-Containing Drug Products Hives   Social History: Social History   Socioeconomic History   Marital status: Married    Spouse name: Not on file   Number of children: 4   Years of education: Not on file   Highest education level: Some college, no degree  Occupational History   Not on file  Tobacco Use   Smoking status: Every Day    Packs/day: 1.00    Years: 45.00    Pack years: 45.00    Types: Cigarettes   Smokeless tobacco: Never   Tobacco comments:    1 pack/ day months  Vaping Use   Vaping Use: Never used  Substance and Sexual Activity   Alcohol use: Yes    Alcohol/week: 0.0 standard drinks    Comment: rare   Drug use: No   Sexual activity: Yes    Birth control/protection: Surgical     Comment: HYST-1st intercourse 60 yo--Fewer than 5 partners  Other Topics Concern   Not on file  Social History Narrative   Not on file   Social Determinants of Health   Financial Resource Strain: Not on file  Food Insecurity: Not on file  Transportation Needs: Not on file  Physical Activity: Not on file  Stress: Not on file  Social Connections: Not on file   Lives in a house. Smoking: 1 pack per day for over 30 years Occupation: not employed  Environmental HistoryFreight forwarder in the house:  not sure Carpet in the family room: no Carpet in the bedroom: no Heating: gas Cooling: central Pet: yes 1 dog and sometimes 1 cat  Family History: Family History  Problem Relation Age of Onset   Allergic rhinitis Mother    Hypertension Mother    Heart failure Mother    Allergic rhinitis Father    Diabetes Father    COPD Father    Emphysema Father    Heart failure Maternal Grandmother    Cancer Maternal Grandfather        LUNG    Heart disease Maternal Grandfather    Lung cancer Maternal Grandfather    Bronchitis Daughter        TWICE YEARLY   Allergic rhinitis Daughter    Bronchitis Son        YEARLY   Allergic rhinitis Son    Food Allergy Grandchild    Asthma Neg Hx  Immunodeficiency Neg Hx    Eczema Neg Hx    Atopy Neg Hx    Angioedema Neg Hx    Review of Systems  Constitutional:  Negative for appetite change, chills, fever and unexpected weight change.  HENT:  Positive for sneezing. Negative for congestion and rhinorrhea.   Eyes:  Negative for itching.  Respiratory:  Positive for cough. Negative for chest tightness, shortness of breath and wheezing.   Cardiovascular:  Negative for chest pain.  Gastrointestinal:  Negative for abdominal pain.  Genitourinary:  Negative for difficulty urinating.  Skin:  Negative for rash.  Allergic/Immunologic: Positive for environmental allergies.  Neurological:  Positive for headaches.   Objective: BP 110/70   Pulse  99   Temp (!) 97.4 F (36.3 C) (Temporal)   Ht 5' (1.524 m)   Wt 136 lb (61.7 kg)   SpO2 94%   BMI 26.56 kg/m  Body mass index is 26.56 kg/m. Physical Exam Vitals and nursing note reviewed.  Constitutional:      Appearance: Normal appearance. She is well-developed.  HENT:     Head: Normocephalic and atraumatic.     Right Ear: External ear normal.     Left Ear: External ear normal.     Nose: Nose normal.     Mouth/Throat:     Mouth: Mucous membranes are moist.     Pharynx: Oropharynx is clear.  Eyes:     Conjunctiva/sclera: Conjunctivae normal.  Cardiovascular:     Rate and Rhythm: Normal rate and regular rhythm.     Heart sounds: Normal heart sounds. No murmur heard.   No friction rub. No gallop.  Pulmonary:     Effort: Pulmonary effort is normal.     Breath sounds: Normal breath sounds. No wheezing, rhonchi or rales.  Abdominal:     Palpations: Abdomen is soft.  Musculoskeletal:     Cervical back: Neck supple.  Skin:    General: Skin is warm.     Findings: No rash.  Neurological:     Mental Status: She is alert and oriented to person, place, and time.  Psychiatric:        Behavior: Behavior normal.  The plan was reviewed with the patient/family, and all questions/concerned were addressed.  It was my pleasure to see [name] today and participate in her care. Please feel free to contact me with any questions or concerns.  Sincerely,  Rexene Alberts, DO Allergy & Immunology  [facility] office: [place] office: [phone]

## 2020-09-12 ENCOUNTER — Encounter: Payer: Self-pay | Admitting: Allergy

## 2020-09-12 ENCOUNTER — Ambulatory Visit (INDEPENDENT_AMBULATORY_CARE_PROVIDER_SITE_OTHER): Admitting: Allergy

## 2020-09-12 ENCOUNTER — Other Ambulatory Visit: Payer: Self-pay

## 2020-09-12 VITALS — BP 110/70 | HR 99 | Temp 97.4°F | Ht 60.0 in | Wt 136.0 lb

## 2020-09-12 DIAGNOSIS — R12 Heartburn: Secondary | ICD-10-CM | POA: Insufficient documentation

## 2020-09-12 DIAGNOSIS — J449 Chronic obstructive pulmonary disease, unspecified: Secondary | ICD-10-CM

## 2020-09-12 DIAGNOSIS — J3089 Other allergic rhinitis: Secondary | ICD-10-CM | POA: Diagnosis not present

## 2020-09-12 DIAGNOSIS — Z720 Tobacco use: Secondary | ICD-10-CM

## 2020-09-12 DIAGNOSIS — T781XXD Other adverse food reactions, not elsewhere classified, subsequent encounter: Secondary | ICD-10-CM | POA: Insufficient documentation

## 2020-09-12 MED ORDER — AZELASTINE HCL 0.1 % NA SOLN: 1.0000 | Freq: Two times a day (BID) | NASAL | 5 refills | Status: DC | PRN

## 2020-09-12 MED ORDER — FLUTICASONE PROPIONATE 50 MCG/ACT NA SUSP: 1.0000 | Freq: Two times a day (BID) | NASAL | 5 refills | Status: DC | PRN

## 2020-09-12 MED ORDER — MONTELUKAST SODIUM 10 MG PO TABS: 10.0000 mg | ORAL_TABLET | Freq: Every day | ORAL | 5 refills | Status: DC

## 2020-09-12 NOTE — Assessment & Plan Note (Signed)
Rhino conjunctivitis symptoms mainly in the spring and fall for 50+ years. Tried OTC antihistamines with good benefit. On AIT briefly but stopped due to hives and tingling sensation in the face.  Today's skin testing showed: Positive to grass, ragweed, dust mites and feathers.   Start environmental control measures as below.  Start Singulair (montelukast) 10mg  daily at night.  Cautioned that in some children/adults can experience behavioral changes including hyperactivity, agitation, depression, sleep disturbances and suicidal ideations. These side effects are rare, but if you notice them you should notify me and discontinue Singulair (montelukast).  Use over the counter antihistamines such as Zyrtec (cetirizine), Claritin (loratadine), Allegra (fexofenadine), or Xyzal (levocetirizine) daily as needed. May take twice a day during allergy flares. May switch antihistamines every few months.  Use azelastine nasal spray 1-2 sprays per nostril twice a day as needed for runny nose/drainage.  Use Flonase (fluticasone) nasal spray 1 spray per nostril twice a day as needed for nasal congestion.

## 2020-09-12 NOTE — Assessment & Plan Note (Signed)
   Continue lifestyle and dietary modifications.  Continue Protonix 40mg  daily as before. Nothing to eat/drink for 30 minutes afterwards.

## 2020-09-12 NOTE — Patient Instructions (Addendum)
Today's skin testing showed: Positive to grass, ragweed, dust mites and feathers.  Negative to foods. Results given.  Breathing:  Daily controller medication(s): HOLD Stiolto START Trelegy 1 puff once a day and rinse mouth after each use. Demonstrated proper use.  Sample given.  May use albuterol rescue inhaler 2 puffs every 4 to 6 hours as needed for shortness of breath, chest tightness, coughing, and wheezing. May use albuterol rescue inhaler 2 puffs 5 to 15 minutes prior to strenuous physical activities. Monitor frequency of use.  Breathing control goals:  Full participation in all desired activities (may need albuterol before activity) Albuterol use two times or less a week on average (not counting use with activity) Cough interfering with sleep two times or less a month Oral steroids no more than once a year No hospitalizations  Environmental allergies Start environmental control measures as below. Start Singulair (montelukast) 10mg  daily at night. Cautioned that in some children/adults can experience behavioral changes including hyperactivity, agitation, depression, sleep disturbances and suicidal ideations. These side effects are rare, but if you notice them you should notify me and discontinue Singulair (montelukast). Use over the counter antihistamines such as Zyrtec (cetirizine), Claritin (loratadine), Allegra (fexofenadine), or Xyzal (levocetirizine) daily as needed. May take twice a day during allergy flares. May switch antihistamines every few months. Use azelastine nasal spray 1-2 sprays per nostril twice a day as needed for runny nose/drainage. Use Flonase (fluticasone) nasal spray 1 spray per nostril twice a day as needed for nasal congestion.   Food: Continue to avoid shellfish, peanuts, sesame seeds, poppy seeds. Get bloodwork:  We are ordering labs, so please allow 1-2 weeks for the results to come back. With the newly implemented Cures Act, the labs might be  visible to you at the same time that they become visible to me. However, I will not address the results until all of the results are back, so please be patient.  In the meantime, continue recommendations in your patient instructions, including avoidance measures (if applicable), until you hear from me. For mild symptoms you can take over the counter antihistamines such as Benadryl and monitor symptoms closely. If symptoms worsen or if you have severe symptoms including breathing issues, throat closure, significant swelling, whole body hives, severe diarrhea and vomiting, lightheadedness then inject epinephrine and seek immediate medical care afterwards. Action plan given.   Follow up in 1 months or sooner if needed.   Reducing Pollen Exposure Pollen seasons: trees (spring), grass (summer) and ragweed/weeds (fall). Keep windows closed in your home and car to lower pollen exposure.  Install air conditioning in the bedroom and throughout the house if possible.  Avoid going out in dry windy days - especially early morning. Pollen counts are highest between 5 - 10 AM and on dry, hot and windy days.  Save outside activities for late afternoon or after a heavy rain, when pollen levels are lower.  Avoid mowing of grass if you have grass pollen allergy. Be aware that pollen can also be transported indoors on people and pets.  Dry your clothes in an automatic dryer rather than hanging them outside where they might collect pollen.  Rinse hair and eyes before bedtime. Control of House Dust Mite Allergen Dust mite allergens are a common trigger of allergy and asthma symptoms. While they can be found throughout the house, these microscopic creatures thrive in warm, humid environments such as bedding, upholstered furniture and carpeting. Because so much time is spent in the bedroom, it is  essential to reduce mite levels there.  Encase pillows, mattresses, and box springs in special allergen-proof fabric covers  or airtight, zippered plastic covers.  Bedding should be washed weekly in hot water (130 F) and dried in a hot dryer. Allergen-proof covers are available for comforters and pillows that can't be regularly washed.  Wash the allergy-proof covers every few months. Minimize clutter in the bedroom. Keep pets out of the bedroom.  Keep humidity less than 50% by using a dehumidifier or air conditioning. You can buy a humidity measuring device called a hygrometer to monitor this.  If possible, replace carpets with hardwood, linoleum, or washable area rugs. If that's not possible, vacuum frequently with a vacuum that has a HEPA filter. Remove all upholstered furniture and non-washable window drapes from the bedroom. Remove all non-washable stuffed toys from the bedroom.  Wash stuffed toys weekly.

## 2020-09-12 NOTE — Assessment & Plan Note (Addendum)
Shellfish caused tingling on her face, some finned fish cause diarrhea, peanuts cause tingling and hives, sesame seeds and poppy seeds cause hives.  Today's skin testing was negative to select foods.  . Continue to avoid shellfish, peanuts, sesame seeds, poppy seeds. . Get bloodwork as below.  . For mild symptoms you can take over the counter antihistamines such as Benadryl and monitor symptoms closely. If symptoms worsen or if you have severe symptoms including breathing issues, throat closure, significant swelling, whole body hives, severe diarrhea and vomiting, lightheadedness then inject epinephrine and seek immediate medical care afterwards. . Action plan given.

## 2020-09-12 NOTE — Assessment & Plan Note (Signed)
Encouraged smoking cessation 

## 2020-09-12 NOTE — Assessment & Plan Note (Signed)
30+ year smoking history and follows with pulmonology. Noted worsening respiratory symptoms the past 2 years. Currently on Stiolto 2 puffs once a day and using albuterol every other day with good benefit. Takes PPI for reflux. eos 200, IgE 427.  Today's spirometry showed: restrictive disease with 11% improvement in FEV1 post bronchodilator treatment. Clinically feeling improved.   Based on clinical history, she most likely has asthma-COPD overlap syndrome.   Daily controller medication(s): HOLD Stiolto . START Trelegy 1 puff once a day and rinse mouth after each use. Demonstrated proper use.  o Sample given.  . May use albuterol rescue inhaler 2 puffs every 4 to 6 hours as needed for shortness of breath, chest tightness, coughing, and wheezing. May use albuterol rescue inhaler 2 puffs 5 to 15 minutes prior to strenuous physical activities. Monitor frequency of use.  . Get spirometry at next visit. . If no improvement, consider starting biologics.

## 2020-09-13 ENCOUNTER — Other Ambulatory Visit: Payer: Self-pay | Admitting: Nurse Practitioner

## 2020-09-13 DIAGNOSIS — F419 Anxiety disorder, unspecified: Secondary | ICD-10-CM

## 2020-09-15 LAB — ALLERGEN PROFILE, FOOD-FISH
Allergen Mackerel IgE: <0.1 kU/L
Allergen Salmon IgE: <0.1 kU/L
Allergen Trout IgE: <0.1 kU/L
Allergen Walley Pike IgE: <0.1 kU/L
Codfish IgE: <0.1 kU/L
Halibut IgE: <0.1 kU/L
Tuna: <0.1 kU/L

## 2020-09-15 LAB — IGE NUT PROF. W/COMPONENT RFLX
F017-IgE Hazelnut (Filbert): <0.1 kU/L
F018-IgE Brazil Nut: <0.1 kU/L
F020-IgE Almond: <0.1 kU/L
F202-IgE Cashew Nut: <0.1 kU/L
F203-IgE Pistachio Nut: <0.1 kU/L
F256-IgE Walnut: <0.1 kU/L
Macadamia Nut, IgE: <0.1 kU/L
Peanut, IgE: <0.1 kU/L
Pecan Nut IgE: <0.1 kU/L

## 2020-09-15 LAB — ALLERGEN PROFILE, SHELLFISH
Clam IgE: 0.19 kU/L — AB
F023-IgE Crab: <0.1 kU/L
F080-IgE Lobster: 0.17 kU/L — AB
F290-IgE Oyster: <0.1 kU/L
Scallop IgE: 0.18 kU/L — AB
Shrimp IgE: 1.02 kU/L — AB

## 2020-09-15 LAB — F224-IGE POPPY SEED: F224-IgE Poppy Seed: <0.1 kU/L

## 2020-09-15 LAB — ALLERGEN SESAME F10: Sesame Seed IgE: <0.1 kU/L

## 2020-09-17 ENCOUNTER — Other Ambulatory Visit: Payer: Self-pay | Admitting: Nurse Practitioner

## 2020-09-17 ENCOUNTER — Other Ambulatory Visit: Payer: Self-pay | Admitting: Internal Medicine

## 2020-09-25 ENCOUNTER — Ambulatory Visit: Admitting: Allergy & Immunology

## 2020-10-09 ENCOUNTER — Other Ambulatory Visit: Payer: Self-pay | Admitting: Nurse Practitioner

## 2020-10-24 ENCOUNTER — Telehealth: Payer: Self-pay | Admitting: *Deleted

## 2020-10-24 NOTE — Telephone Encounter (Signed)
Try this T78.00XD

## 2020-10-24 NOTE — Telephone Encounter (Signed)
[name] has the folder with this form in it and will update it and fax it back tomorrow 10/25/2020.

## 2020-10-24 NOTE — Telephone Encounter (Signed)
I received a fax from [facility] stating that her insurance company does not want to cover the labs that were drawn due to the T78.1XXD diagnosis code. Are there any other diagnosis codes that can be submitted for the patient? Thanks Dr. Selena Batten.

## 2020-10-25 NOTE — Telephone Encounter (Signed)
Form has been filled out and faxed to [phone]. Form has been labeled and placed in bulk scanning.

## 2020-11-13 ENCOUNTER — Other Ambulatory Visit: Payer: Self-pay | Admitting: Nurse Practitioner

## 2020-11-25 NOTE — Progress Notes (Signed)
Follow Up Note  RE: [name] MRN: [id number] DOB: 03-07-1960 Date of Office Visit: 11/26/2020  Referring provider: [name], [name] Primary care provider: [name], [name]  Chief Complaint: Follow-up  History of Present Illness: I had the pleasure of seeing [name] for a follow up visit at the [facility] on 11/26/2020. She is a 60 y.o. female, who is being followed for asthma/COPD overlap syndrome, allergic rhinoconjunctivitis, adverse food reaction and heartburn. Her previous allergy office visit was on 09/12/2020 with Dr. Maudie Mercury. Today is a regular follow up visit.  Asthma-COPD overlap syndrome Currently on Trelegy 151mg 1 puff once a day which has been helping more than the Stiolto alone. Using albuterol every other day due to exposure to cleaning agents/strong scents. She cleans for a living.   Denies any ER/urgent care visits or prednisone use since the last visit.  Still smoking 1/2 pack per day.  Allergic rhinitis Taking Singulair at night which has been helping. The nasal sprays she does not like to use.    Food allergy: Currently avoiding shrimp, peanuts, sesame seeds and poppy seeds. No reactions since the last visit. Tolerates fish with no issues.    Heartburn Stable with Protonix and Pepcid.  Assessment and Plan: BJacquelynnis a 60y.o. female with: Asthma-COPD overlap syndrome (HNicholas Past history - 30+ year smoking history and follows with pulmonology. Noted worsening respiratory symptoms the past 2 years. Currently on Stiolto 2 puffs once a day and using albuterol every other day with good benefit. Takes PPI for reflux. eos 200, IgE 427. 2022 spirometry showed: restrictive disease with 11% improvement in FEV1 post bronchodilator treatment. Clinically feeling improved.  Interim history - doing better with Trelegy but still using albuterol every other day. Today's spirometry showed some restriction. Daily controller medication(s):  Increase Trelegy to 2023m 1 puff once a day and rinse mouth after each use. Sample given.  May use albuterol rescue inhaler 2 puffs every 4 to 6 hours as needed for shortness of breath, chest tightness, coughing, and wheezing. May use albuterol rescue inhaler 2 puffs 5 to 15 minutes prior to strenuous physical activities. Monitor frequency of use.  Get spirometry at next visit. If no improvement, consider starting biologics.   Tobacco user Smokes 1/2 ppd. Encouraged smoking cessation.  Seasonal and perennial allergic rhinoconjunctivitis Past history - Rhino conjunctivitis symptoms mainly in the spring and fall for 50+ years. On AIT briefly but stopped due to hives and tingling sensation in the face. 2022 skin testing showed: Positive to grass, ragweed, dust mites and feathers.  Interim history - does not like to use nasal sprays. Continue environmental control measures as below. Continue Singulair (montelukast) 1030maily at night. Use over the counter antihistamines such as Zyrtec (cetirizine), Claritin (loratadine), Allegra (fexofenadine), or Xyzal (levocetirizine) daily as needed. May take twice a day during allergy flares. May switch antihistamines every few months.  Other adverse food reactions, not elsewhere classified, subsequent encounter Past history - Shellfish caused tingling on her face, some finned fish cause diarrhea, peanuts cause tingling and hives, sesame seeds and poppy seeds cause hives. 2022 skin testing was negative to select foods. 2022 bloodwork was positive to shrimp only. Borderline positive to clam, scallop and lobster. Negative to fish panel, tree nuts, peanuts, sesame seed and poppy seed. Food: Continue to avoid shellfish, peanuts, sesame seeds, poppy seeds. Not interested in food challenges.  Okay to eat fish as before.  For mild symptoms you can take  over the counter antihistamines such as Benadryl and monitor symptoms closely. If symptoms worsen or if you have  severe symptoms including breathing issues, throat closure, significant swelling, whole body hives, severe diarrhea and vomiting, lightheadedness then inject epinephrine and seek immediate medical care afterwards. Action plan in place.   Heartburn Stable.  Continue lifestyle and dietary modifications. Continue Protonix 40mg  daily as before. Nothing to eat/drink for 30 minutes afterwards.  Continue famotidine 20mg  daily at night as before.   Return in about 3 months (around 02/26/2021).  Meds ordered this encounter  Medications   Fluticasone-Umeclidin-Vilant (TRELEGY ELLIPTA) 200-62.5-25 MCG/INH AEPB    Sig: Inhale 1 puff into the lungs daily. Rinse mouth after each use.    Dispense:  60 each    Refill:  5    Lab Orders  No laboratory test(s) ordered today   Diagnostics: Spirometry:  Tracings reviewed. Her effort: Good reproducible efforts. FVC: 1.59L FEV1: 1.30L, 76% predicted FEV1/FVC ratio: 82% Interpretation: Spirometry consistent with possible restrictive disease.  Please see scanned spirometry results for details.  Medication List:  Current Outpatient Medications  Medication Sig Dispense Refill   albuterol (VENTOLIN HFA) 108 (90 Base) MCG/ACT inhaler INHALE 1 PUFF BY MOUTH EVERY 6 HOURS AS NEEDED FOR WHEEZING OR SHORTNESS OF BREATH 9 g 0   alum & mag hydroxide-simeth (MAALOX/MYLANTA) 200-200-20 MG/5ML suspension Take 15 mLs by mouth every 4 (four) hours as needed for indigestion or heartburn. 355 mL 0   aspirin 81 MG chewable tablet Chew 81 mg by mouth daily.     blood glucose meter kit and supplies KIT Dispense based on patient and insurance preference. Use up to four times daily as directed. (FOR ICD-9 250.00, 250.01). 1 each 0   busPIRone (BUSPAR) 10 MG tablet Take 1 tablet by mouth once daily 90 tablet 0   cyclobenzaprine (FLEXERIL) 10 MG tablet TAKE 1 TABLET BY MOUTH THREE TIMES DAILY 30 tablet 0   DULoxetine (CYMBALTA) 60 MG capsule Take 1 capsule (60 mg total) by  mouth daily. 90 capsule 1   EPINEPHrine 0.3 mg/0.3 mL IJ SOAJ injection Inject 0.3 mg into the muscle as needed for anaphylaxis. 1 each 2   famotidine (PEPCID) 20 MG tablet One after supper 30 tablet 11   Fluticasone-Umeclidin-Vilant (TRELEGY ELLIPTA) 200-62.5-25 MCG/INH AEPB Inhale 1 puff into the lungs daily. Rinse mouth after each use. 60 each 5   gabapentin (NEURONTIN) 300 MG capsule TAKE 1 CAPSULE BY MOUTH THREE TIMES DAILY 180 capsule 0   HYDROcodone bit-homatropine (HYDROMET) 5-1.5 MG/5ML syrup Take 5 mLs by mouth every 6 (six) hours as needed for cough. 120 mL 0   Magnesium 250 MG TABS Take 1 tablet (250 mg total) by mouth daily. Take with evening meal (Patient taking differently: Take 1 tablet by mouth as needed. Take with evening meal EVERY OTHER DAY) 90 tablet 1   Melatonin 5 MG TABS Take 1 tablet by mouth as needed.      metFORMIN (GLUCOPHAGE-XR) 750 MG 24 hr tablet Take 1 tablet (750 mg total) by mouth daily with breakfast. 90 tablet 0   montelukast (SINGULAIR) 10 MG tablet Take 1 tablet (10 mg total) by mouth at bedtime. 30 tablet 5   olmesartan (BENICAR) 5 MG tablet Take 1 tablet by mouth once daily 90 tablet 0   pantoprazole (PROTONIX) 40 MG tablet TAKE 1 TABLET BY MOUTH ONCE DAILY(TAKE 30-60 MINUTES BEFORE FIRST MEAL OF THE DAY) 30 tablet 0   rosuvastatin (CRESTOR) 40 MG tablet Take 1  tablet (40 mg total) by mouth daily. 90 tablet 1   sitaGLIPtin (JANUVIA) 25 MG tablet Take 1 tablet (25 mg total) by mouth daily. 90 tablet 1   Tetrahydrozoline HCl (VISINE OP) Place 1 drop into both eyes daily.     No current facility-administered medications for this visit.   Allergies: Allergies  Allergen Reactions   Tylenol [Acetaminophen] Itching   Pollen Extract    Shellfish Allergy Hives   Peanut-Containing Drug Products Hives   I reviewed her past medical history, social history, family history, and environmental history and no significant changes have been reported from her previous  visit.  Review of Systems  Constitutional:  Negative for appetite change, chills, fever and unexpected weight change.  HENT:  Positive for congestion. Negative for rhinorrhea and sneezing.   Eyes:  Negative for itching.  Respiratory:  Positive for cough. Negative for chest tightness, shortness of breath and wheezing.   Cardiovascular:  Negative for chest pain.  Gastrointestinal:  Negative for abdominal pain.  Genitourinary:  Negative for difficulty urinating.  Skin:  Negative for rash.  Allergic/Immunologic: Positive for environmental allergies.  Neurological:  Positive for headaches.   Objective: BP 108/72   Pulse 94   Temp 97.9 F (36.6 C) (Temporal)   Resp 18   Ht 5' (1.524 m)   Wt 135 lb 4 oz (61.3 kg)   SpO2 97%   BMI 26.41 kg/m  Body mass index is 26.41 kg/m. Physical Exam Vitals and nursing note reviewed.  Constitutional:      Appearance: Normal appearance. She is well-developed.  HENT:     Head: Normocephalic and atraumatic.     Right Ear: External ear normal.     Left Ear: External ear normal.     Nose: Nose normal.     Mouth/Throat:     Mouth: Mucous membranes are moist.     Pharynx: Oropharynx is clear.  Eyes:     Conjunctiva/sclera: Conjunctivae normal.  Cardiovascular:     Rate and Rhythm: Normal rate and regular rhythm.     Heart sounds: Normal heart sounds. No murmur heard.   No friction rub. No gallop.  Pulmonary:     Effort: Pulmonary effort is normal.     Breath sounds: Normal breath sounds. No wheezing, rhonchi or rales.  Abdominal:     Palpations: Abdomen is soft.  Musculoskeletal:     Cervical back: Neck supple.  Skin:    General: Skin is warm.     Findings: No rash.  Neurological:     Mental Status: She is alert and oriented to person, place, and time.  Psychiatric:        Behavior: Behavior normal.   Previous notes and tests were reviewed. The plan was reviewed with the patient/family, and all questions/concerned were  addressed.  It was my pleasure to see [name] today and participate in her care. Please feel free to contact me with any questions or concerns.  Sincerely,  Rexene Alberts, DO Allergy & Immunology  [facility] office: [place] office: [phone]

## 2020-11-26 ENCOUNTER — Other Ambulatory Visit: Payer: Self-pay

## 2020-11-26 ENCOUNTER — Ambulatory Visit (INDEPENDENT_AMBULATORY_CARE_PROVIDER_SITE_OTHER): Admitting: Allergy

## 2020-11-26 ENCOUNTER — Encounter: Payer: Self-pay | Admitting: Allergy

## 2020-11-26 VITALS — BP 108/72 | HR 94 | Temp 97.9°F | Resp 18 | Ht 60.0 in | Wt 135.2 lb

## 2020-11-26 DIAGNOSIS — J302 Other seasonal allergic rhinitis: Secondary | ICD-10-CM

## 2020-11-26 DIAGNOSIS — J3089 Other allergic rhinitis: Secondary | ICD-10-CM

## 2020-11-26 DIAGNOSIS — R12 Heartburn: Secondary | ICD-10-CM | POA: Diagnosis not present

## 2020-11-26 DIAGNOSIS — Z720 Tobacco use: Secondary | ICD-10-CM | POA: Diagnosis not present

## 2020-11-26 DIAGNOSIS — H1010 Acute atopic conjunctivitis, unspecified eye: Secondary | ICD-10-CM | POA: Insufficient documentation

## 2020-11-26 DIAGNOSIS — J449 Chronic obstructive pulmonary disease, unspecified: Secondary | ICD-10-CM | POA: Diagnosis not present

## 2020-11-26 DIAGNOSIS — T781XXD Other adverse food reactions, not elsewhere classified, subsequent encounter: Secondary | ICD-10-CM

## 2020-11-26 DIAGNOSIS — H1013 Acute atopic conjunctivitis, bilateral: Secondary | ICD-10-CM

## 2020-11-26 MED ORDER — TRELEGY ELLIPTA 200-62.5-25 MCG/INH IN AEPB: 1.0000 | INHALATION_SPRAY | Freq: Every day | RESPIRATORY_TRACT | 5 refills | Status: DC

## 2020-11-26 NOTE — Assessment & Plan Note (Signed)
Smokes 1/2 ppd.  Encouraged smoking cessation.

## 2020-11-26 NOTE — Assessment & Plan Note (Addendum)
Past history - Shellfish caused tingling on her face, some finned fish cause diarrhea, peanuts cause tingling and hives, sesame seeds and poppy seeds cause hives. 2022 skin testing was negative to select foods. 2022 bloodwork was positive to shrimp only. Borderline positive to clam, scallop and lobster. Negative to fish panel, tree nuts, peanuts, sesame seed and poppy seed. Food: . Continue to avoid shellfish, peanuts, sesame seeds, poppy seeds. Not interested in food challenges.  Molli Knock to eat fish as before.  . For mild symptoms you can take over the counter antihistamines such as Benadryl and monitor symptoms closely. If symptoms worsen or if you have severe symptoms including breathing issues, throat closure, significant swelling, whole body hives, severe diarrhea and vomiting, lightheadedness then inject epinephrine and seek immediate medical care afterwards. . Action plan in place.

## 2020-11-26 NOTE — Assessment & Plan Note (Addendum)
Past history - Rhino conjunctivitis symptoms mainly in the spring and fall for 50+ years. On AIT briefly but stopped due to hives and tingling sensation in the face. 2022 skin testing showed: Positive to grass, ragweed, dust mites and feathers.  Interim history - does not like to use nasal sprays.  Continue environmental control measures as below.  Continue Singulair (montelukast) 10mg  daily at night.  Use over the counter antihistamines such as Zyrtec (cetirizine), Claritin (loratadine), Allegra (fexofenadine), or Xyzal (levocetirizine) daily as needed. May take twice a day during allergy flares. May switch antihistamines every few months.

## 2020-11-26 NOTE — Assessment & Plan Note (Signed)
Stable.  . Continue lifestyle and dietary modifications. . Continue Protonix 40mg  daily as before. Nothing to eat/drink for 30 minutes afterwards.  . Continue famotidine 20mg  daily at night as before.

## 2020-11-26 NOTE — Assessment & Plan Note (Addendum)
Past history - 30+ year smoking history and follows with pulmonology. Noted worsening respiratory symptoms the past 2 years. Currently on Stiolto 2 puffs once a day and using albuterol every other day with good benefit. Takes PPI for reflux. eos 200, IgE 427. 2022 spirometry showed: restrictive disease with 11% improvement in FEV1 post bronchodilator treatment. Clinically feeling improved.  Interim history - doing better with Trelegy but still using albuterol every other day.  Today's spirometry showed some restriction. . Daily controller medication(s): Increase Trelegy to 1 puff once a day and rinse mouth after each use. Sample given.  . May use albuterol rescue inhaler 2 puffs every 4 to 6 hours as needed for shortness of breath, chest tightness, coughing, and wheezing. May use albuterol rescue inhaler 2 puffs 5 to 15 minutes prior to strenuous physical activities. Monitor frequency of use.  . Get spirometry at next visit. . If no improvement, consider starting biologics.

## 2020-11-26 NOTE — Patient Instructions (Addendum)
Breathing:  Try to decrease smoking.  Daily controller medication(s): Increase Trelegy to 1 puff once a day and rinse mouth after each use. Sample given.  May use albuterol rescue inhaler 2 puffs every 4 to 6 hours as needed for shortness of breath, chest tightness, coughing, and wheezing. May use albuterol rescue inhaler 2 puffs 5 to 15 minutes prior to strenuous physical activities. Monitor frequency of use.  Breathing control goals:  Full participation in all desired activities (may need albuterol before activity) Albuterol use two times or less a week on average (not counting use with activity) Cough interfering with sleep two times or less a month Oral steroids no more than once a year No hospitalizations  Environmental allergies 2022 skin testing: Positive to grass, ragweed, dust mites and feathers.  Continue environmental control measures as below. Continue Singulair (montelukast) 10mg  daily at night. Use over the counter antihistamines such as Zyrtec (cetirizine), Claritin (loratadine), Allegra (fexofenadine), or Xyzal (levocetirizine) daily as needed. May take twice a day during allergy flares. May switch antihistamines every few months.  Food: Continue to avoid shellfish, peanuts, sesame seeds, poppy seeds. For mild symptoms you can take over the counter antihistamines such as Benadryl and monitor symptoms closely. If symptoms worsen or if you have severe symptoms including breathing issues, throat closure, significant swelling, whole body hives, severe diarrhea and vomiting, lightheadedness then inject epinephrine and seek immediate medical care afterwards. Action plan in place.   Heartburn: Continue lifestyle and dietary modifications. Continue Protonix 40mg  daily as before. Nothing to eat/drink for 30 minutes afterwards.  Continue famotidine 20mg  daily at night as before.   Follow up in 3 months or sooner if needed.   Reducing Pollen Exposure Pollen seasons: trees  (spring), grass (summer) and ragweed/weeds (fall). Keep windows closed in your home and car to lower pollen exposure.  Install air conditioning in the bedroom and throughout the house if possible.  Avoid going out in dry windy days - especially early morning. Pollen counts are highest between 5 - 10 AM and on dry, hot and windy days.  Save outside activities for late afternoon or after a heavy rain, when pollen levels are lower.  Avoid mowing of grass if you have grass pollen allergy. Be aware that pollen can also be transported indoors on people and pets.  Dry your clothes in an automatic dryer rather than hanging them outside where they might collect pollen.  Rinse hair and eyes before bedtime. Control of House Dust Mite Allergen Dust mite allergens are a common trigger of allergy and asthma symptoms. While they can be found throughout the house, these microscopic creatures thrive in warm, humid environments such as bedding, upholstered furniture and carpeting. Because so much time is spent in the bedroom, it is essential to reduce mite levels there.  Encase pillows, mattresses, and box springs in special allergen-proof fabric covers or airtight, zippered plastic covers.  Bedding should be washed weekly in hot water (130 F) and dried in a hot dryer. Allergen-proof covers are available for comforters and pillows that can't be regularly washed.  Wash the allergy-proof covers every few months. Minimize clutter in the bedroom. Keep pets out of the bedroom.  Keep humidity less than 50% by using a dehumidifier or air conditioning. You can buy a humidity measuring device called a hygrometer to monitor this.  If possible, replace carpets with hardwood, linoleum, or washable area rugs. If that's not possible, vacuum frequently with a vacuum that has a HEPA filter. Remove  all upholstered furniture and non-washable window drapes from the bedroom. Remove all non-washable stuffed toys from the bedroom.   Wash stuffed toys weekly.

## 2020-11-28 ENCOUNTER — Other Ambulatory Visit: Payer: Self-pay | Admitting: Nurse Practitioner

## 2020-11-29 ENCOUNTER — Ambulatory Visit (INDEPENDENT_AMBULATORY_CARE_PROVIDER_SITE_OTHER): Admitting: Nurse Practitioner

## 2020-11-29 ENCOUNTER — Encounter: Payer: Self-pay | Admitting: Nurse Practitioner

## 2020-11-29 ENCOUNTER — Telehealth: Payer: Self-pay | Admitting: Allergy

## 2020-11-29 ENCOUNTER — Other Ambulatory Visit: Payer: Self-pay

## 2020-11-29 VITALS — BP 110/76 | HR 52 | Temp 98.3°F | Ht 60.0 in | Wt 135.6 lb

## 2020-11-29 DIAGNOSIS — I10 Essential (primary) hypertension: Secondary | ICD-10-CM | POA: Diagnosis not present

## 2020-11-29 DIAGNOSIS — F32A Depression, unspecified: Secondary | ICD-10-CM | POA: Diagnosis not present

## 2020-11-29 DIAGNOSIS — E119 Type 2 diabetes mellitus without complications: Secondary | ICD-10-CM | POA: Diagnosis not present

## 2020-11-29 DIAGNOSIS — E782 Mixed hyperlipidemia: Secondary | ICD-10-CM

## 2020-11-29 DIAGNOSIS — Z720 Tobacco use: Secondary | ICD-10-CM

## 2020-11-29 DIAGNOSIS — Z23 Encounter for immunization: Secondary | ICD-10-CM | POA: Diagnosis not present

## 2020-11-29 DIAGNOSIS — R252 Cramp and spasm: Secondary | ICD-10-CM

## 2020-11-29 LAB — LIPID PANEL
Chol/HDL Ratio: 3.6 ratio (ref 0.0–4.4)
Cholesterol, Total: 244 mg/dL — ABNORMAL HIGH (ref 100–199)
HDL: 67 mg/dL (ref >39)
LDL Chol Calc (NIH): 125 mg/dL — ABNORMAL HIGH (ref 0–99)
Triglycerides: 300 mg/dL — ABNORMAL HIGH (ref 0–149)
VLDL Cholesterol Cal: 52 mg/dL — ABNORMAL HIGH (ref 5–40)

## 2020-11-29 LAB — CMP14+EGFR
ALT: 45 IU/L — ABNORMAL HIGH (ref 0–32)
AST: 41 IU/L — ABNORMAL HIGH (ref 0–40)
Albumin/Globulin Ratio: 1.7 (ref 1.2–2.2)
Albumin: 5 g/dL — ABNORMAL HIGH (ref 3.8–4.8)
Alkaline Phosphatase: 111 IU/L (ref 44–121)
BUN/Creatinine Ratio: 12 (ref 12–28)
BUN: 12 mg/dL (ref 8–27)
Bilirubin Total: 0.3 mg/dL (ref 0.0–1.2)
CO2: 22 mmol/L (ref 20–29)
Calcium: 10.2 mg/dL (ref 8.7–10.3)
Chloride: 92 mmol/L — ABNORMAL LOW (ref 96–106)
Creatinine, Ser: 1.03 mg/dL — ABNORMAL HIGH (ref 0.57–1.00)
Globulin, Total: 2.9 g/dL (ref 1.5–4.5)
Glucose: 351 mg/dL — ABNORMAL HIGH (ref 70–99)
Potassium: 4.5 mmol/L (ref 3.5–5.2)
Sodium: 135 mmol/L (ref 134–144)
Total Protein: 7.9 g/dL (ref 6.0–8.5)
eGFR: 62 mL/min/{1.73_m2} (ref >59)

## 2020-11-29 LAB — HEMOGLOBIN A1C
Est. average glucose Bld gHb Est-mCnc: 309 mg/dL
Hgb A1c MFr Bld: 12.4 % — ABNORMAL HIGH (ref 4.8–5.6)

## 2020-11-29 MED ORDER — BREZTRI AEROSPHERE 160-9-4.8 MCG/ACT IN AERO: 2.0000 | INHALATION_SPRAY | Freq: Two times a day (BID) | RESPIRATORY_TRACT | 3 refills | Status: DC

## 2020-11-29 MED ORDER — CYCLOBENZAPRINE HCL 10 MG PO TABS: 10.0000 mg | ORAL_TABLET | Freq: Three times a day (TID) | ORAL | 2 refills | Status: DC | PRN

## 2020-11-29 NOTE — Telephone Encounter (Signed)
Patient called and said that the Trelegy was expense and would like to have something that she can afford.. [phone]

## 2020-11-29 NOTE — Patient Instructions (Signed)

## 2020-11-29 NOTE — Telephone Encounter (Signed)
Called and left a message asking for patient to return call to discuss.  

## 2020-11-29 NOTE — Progress Notes (Signed)
I,Katawbba Wiggins,acting as a Education administrator for [employer], [name].,have documented all relevant documentation on the behalf of [name], [name],as directed by  [name], [name] while in the presence of [name], Holly.   This visit occurred during the SARS-CoV-2 public health emergency.  Safety protocols were in place, including screening questions prior to the visit, additional usage of staff PPE, and extensive cleaning of exam room while observing appropriate contact time as indicated for disinfecting solutions.  Subjective:     Patient ID: [name] , female    DOB: 02/05/1961 , 60 y.o.   MRN: [id number]   Chief Complaint  Patient presents with   Depression    HPI  Patient presents today for a depression f/u. She feels like her mood is fairly good.   Depression        This is a recurrent problem.  The current episode started more than 1 month ago.   The problem has been gradually improving since onset.  Associated symptoms include fatigue, insomnia (will take melatonin every other night) and headaches (Couple of headaches and will take Tylenol with good effect.).  Associated symptoms include no appetite change.  Compliance with treatment is good.  Risk factors: recent death of her husband.  Insomnia Primary symptoms: sleep disturbance, no difficulty falling asleep, no frequent awakening.   PMH includes: depression.     Past Medical History:  Diagnosis Date   Acute kidney failure with lesion of tubular necrosis ([facility]) 11/11/2016   Anxiety    Back pain    Diabetes mellitus    Essential hypertension 11/11/2016   Fibromyalgia    High cholesterol    Hypertension    Syncope 11/11/2016     Family History  Problem Relation Age of Onset   Allergic rhinitis Mother    Hypertension Mother    Heart failure Mother    Allergic rhinitis Father    Diabetes Father    COPD Father    Emphysema Father    Heart failure Maternal Grandmother    Cancer Maternal Grandfather        LUNG     Heart disease Maternal Grandfather    Lung cancer Maternal Grandfather    Bronchitis Daughter        TWICE YEARLY   Allergic rhinitis Daughter    Bronchitis Son        YEARLY   Allergic rhinitis Son    Food Allergy Grandchild    Asthma Neg Hx    Immunodeficiency Neg Hx    Eczema Neg Hx    Atopy Neg Hx    Angioedema Neg Hx      Current Outpatient Medications:    albuterol (VENTOLIN HFA) 108 (90 Base) MCG/ACT inhaler, INHALE 1 PUFF BY MOUTH EVERY 6 HOURS AS NEEDED FOR WHEEZING OR SHORTNESS OF BREATH, Disp: 9 g, Rfl: 0   alum & mag hydroxide-simeth (MAALOX/MYLANTA) 200-200-20 MG/5ML suspension, Take 15 mLs by mouth every 4 (four) hours as needed for indigestion or heartburn., Disp: 355 mL, Rfl: 0   aspirin 81 MG chewable tablet, Chew 81 mg by mouth daily., Disp: , Rfl:    blood glucose meter kit and supplies KIT, Dispense based on patient and insurance preference. Use up to four times daily as directed. (FOR ICD-9 250.00, 250.01)., Disp: 1 each, Rfl: 0   busPIRone (BUSPAR) 10 MG tablet, Take 1 tablet by mouth once daily, Disp: 90 tablet, Rfl: 0   DULoxetine (CYMBALTA) 60 MG capsule, Take 1 capsule (60 mg  total) by mouth daily., Disp: 90 capsule, Rfl: 1   EPINEPHrine 0.3 mg/0.3 mL IJ SOAJ injection, Inject 0.3 mg into the muscle as needed for anaphylaxis., Disp: 1 each, Rfl: 2   famotidine (PEPCID) 20 MG tablet, One after supper, Disp: 30 tablet, Rfl: 11   Fluticasone-Umeclidin-Vilant (TRELEGY ELLIPTA) 200-62.5-25 MCG/INH AEPB, Inhale 1 puff into the lungs daily. Rinse mouth after each use., Disp: 60 each, Rfl: 5   gabapentin (NEURONTIN) 300 MG capsule, TAKE 1 CAPSULE BY MOUTH THREE TIMES DAILY, Disp: 180 capsule, Rfl: 0   HYDROcodone bit-homatropine (HYDROMET) 5-1.5 MG/5ML syrup, Take 5 mLs by mouth every 6 (six) hours as needed for cough., Disp: 120 mL, Rfl: 0   Magnesium 250 MG TABS, Take 1 tablet (250 mg total) by mouth daily. Take with evening meal (Patient taking differently: Take 1  tablet by mouth as needed. Take with evening meal EVERY OTHER DAY), Disp: 90 tablet, Rfl: 1   Melatonin 5 MG TABS, Take 1 tablet by mouth as needed. , Disp: , Rfl:    metFORMIN (GLUCOPHAGE-XR) 750 MG 24 hr tablet, Take 1 tablet (750 mg total) by mouth daily with breakfast., Disp: 90 tablet, Rfl: 0   montelukast (SINGULAIR) 10 MG tablet, Take 1 tablet (10 mg total) by mouth at bedtime., Disp: 30 tablet, Rfl: 5   olmesartan (BENICAR) 5 MG tablet, Take 1 tablet by mouth once daily, Disp: 90 tablet, Rfl: 0   pantoprazole (PROTONIX) 40 MG tablet, TAKE 1 TABLET BY MOUTH ONCE DAILY(TAKE 30-60 MINUTES BEFORE FIRST MEAL OF THE DAY), Disp: 30 tablet, Rfl: 0   rosuvastatin (CRESTOR) 40 MG tablet, Take 1 tablet (40 mg total) by mouth daily., Disp: 90 tablet, Rfl: 1   sitaGLIPtin (JANUVIA) 25 MG tablet, Take 1 tablet (25 mg total) by mouth daily., Disp: 90 tablet, Rfl: 1   Tetrahydrozoline HCl (VISINE OP), Place 1 drop into both eyes daily., Disp: , Rfl:    Budeson-Glycopyrrol-Formoterol (BREZTRI AEROSPHERE) 160-9-4.8 MCG/ACT AERO, Inhale 2 puffs into the lungs in the morning and at bedtime. with spacer and rinse mouth afterwards., Disp: 10.7 g, Rfl: 3   cyclobenzaprine (FLEXERIL) 10 MG tablet, Take 1 tablet (10 mg total) by mouth 3 (three) times daily as needed for muscle spasms., Disp: 30 tablet, Rfl: 2   Allergies  Allergen Reactions   Tylenol [Acetaminophen] Itching   Pollen Extract    Shellfish Allergy Hives   Peanut-Containing Drug Products Hives     Review of Systems  Constitutional:  Positive for fatigue. Negative for appetite change.  Respiratory: Negative.    Cardiovascular: Negative.   Gastrointestinal: Negative.   Musculoskeletal: Negative.   Neurological:  Positive for headaches (Couple of headaches and will take Tylenol with good effect.). Negative for dizziness.  Psychiatric/Behavioral:  Positive for depression and sleep disturbance. The patient has insomnia (will take melatonin every  other night).   All other systems reviewed and are negative.   Today's Vitals   11/29/20 0940  BP: 110/76  Pulse: (!) 52  Temp: 98.3 F (36.8 C)  Weight: 135 lb 9.6 oz (61.5 kg)  Height: 5' (1.524 m)   Body mass index is 26.48 kg/m.  Wt Readings from Last 3 Encounters:  11/29/20 135 lb 9.6 oz (61.5 kg)  11/26/20 135 lb 4 oz (61.3 kg)  09/12/20 136 lb (61.7 kg)    BP Readings from Last 3 Encounters:  11/29/20 110/76  11/26/20 108/72  09/12/20 110/70    Objective:  Physical Exam Constitutional:  Appearance: Normal appearance. She is normal weight. She is not ill-appearing.  Cardiovascular:     Rate and Rhythm: Normal rate and regular rhythm.     Pulses: Normal pulses.     Heart sounds: Normal heart sounds. No murmur heard. Pulmonary:     Effort: Pulmonary effort is normal. No respiratory distress.     Breath sounds: Normal breath sounds. No wheezing.  Musculoskeletal:        General: Normal range of motion.     Cervical back: Normal range of motion and neck supple.  Skin:    General: Skin is warm and dry.     Capillary Refill: Capillary refill takes less than 2 seconds.  Neurological:     General: No focal deficit present.     Mental Status: She is alert and oriented to person, place, and time.     Cranial Nerves: No cranial nerve deficit.     Motor: No weakness.  Psychiatric:        Mood and Affect: Mood normal.        Behavior: Behavior normal.        Thought Content: Thought content normal.        Judgment: Judgment normal.        Assessment And Plan:     1. Depression, unspecified depression type Comments: She feels she is doing better, continue current medications - CMP14+EGFR  2. Type 2 diabetes mellitus without complication, without long-term current use of insulin (HCC) Comments: Stable, continue current medications - CMP14+EGFR - Hemoglobin A1c  3. Essential hypertension Comments: Blood pressure is well controlled, continue current  medications  4. Mixed hyperlipidemia Chronic, stable, continue current medications - Lipid panel  5. Cramps, muscle, general Comments: Will provide muscle relaxer as needed - cyclobenzaprine (FLEXERIL) 10 MG tablet; Take 1 tablet (10 mg total) by mouth 3 (three) times daily as needed for muscle spasms.  Dispense: 30 tablet; Refill: 2  6. Immunization due Influenza vaccine administered Encouraged to take Tylenol as needed for fever or muscle aches. - Flu Vaccine QUAD 6+ mos PF IM (Fluarix Quad PF)   7. Tobacco abuse Smoking cessation instruction/counseling given:  counseled patient on the dangers of tobacco use, advised patient to stop smoking, and reviewed strategies to maximize success    Patient was given opportunity to ask questions. Patient verbalized understanding of the plan and was able to repeat key elements of the plan. All questions were answered to their satisfaction.  [name], [name]   I, [name], [name], have reviewed all documentation for this visit. The documentation on 11/29/20 for the exam, diagnosis, procedures, and orders are all accurate and complete.   IF YOU HAVE BEEN REFERRED TO A SPECIALIST, IT MAY TAKE 1-2 WEEKS TO SCHEDULE/PROCESS THE REFERRAL. IF YOU HAVE NOT HEARD FROM US/SPECIALIST IN TWO WEEKS, PLEASE GIVE [place] A CALL AT [phone].   THE PATIENT IS ENCOURAGED TO PRACTICE SOCIAL DISTANCING DUE TO THE COVID-19 PANDEMIC.

## 2020-11-29 NOTE — Telephone Encounter (Signed)
Please call patient.  The only other similar inhaler to trelegy is Markus Daft which is 2 puffs twice a day - please leave 2 samples for her to pick up tomorrow with the coupon.  She can try it first and I will send in the Rx to see if it's cheaper.  If not, we may have to give her 2 different inhalers.

## 2020-11-30 ENCOUNTER — Other Ambulatory Visit: Payer: Self-pay | Admitting: *Deleted

## 2020-11-30 MED ORDER — TRELEGY ELLIPTA 200-62.5-25 MCG/INH IN AEPB: 1.0000 | INHALATION_SPRAY | Freq: Every day | RESPIRATORY_TRACT | 5 refills | Status: DC

## 2020-11-30 NOTE — Telephone Encounter (Signed)
Patient called back and stated that [facility] to [facility] to [facility] in [place] and they will mail her medication to her. She wanted to try and stay on the Trelegy since it works for her. Medication refill has been sent to [facility].

## 2020-11-30 NOTE — Telephone Encounter (Signed)
[facility] [phone].

## 2020-12-04 ENCOUNTER — Other Ambulatory Visit: Payer: Self-pay

## 2020-12-04 DIAGNOSIS — E119 Type 2 diabetes mellitus without complications: Secondary | ICD-10-CM

## 2020-12-04 DIAGNOSIS — I700 Atherosclerosis of aorta: Secondary | ICD-10-CM

## 2020-12-04 DIAGNOSIS — F419 Anxiety disorder, unspecified: Secondary | ICD-10-CM

## 2020-12-04 DIAGNOSIS — R252 Cramp and spasm: Secondary | ICD-10-CM

## 2020-12-04 DIAGNOSIS — M797 Fibromyalgia: Secondary | ICD-10-CM

## 2020-12-04 MED ORDER — PANTOPRAZOLE SODIUM 40 MG PO TBEC: DELAYED_RELEASE_TABLET | ORAL | 1 refills | Status: DC

## 2020-12-04 MED ORDER — DULOXETINE HCL 60 MG PO CPEP: 60.0000 mg | ORAL_CAPSULE | Freq: Every day | ORAL | 1 refills | Status: DC

## 2020-12-04 MED ORDER — GABAPENTIN 300 MG PO CAPS: 300.0000 mg | ORAL_CAPSULE | Freq: Three times a day (TID) | ORAL | 0 refills | Status: DC

## 2020-12-04 MED ORDER — OLMESARTAN MEDOXOMIL 5 MG PO TABS: 5.0000 mg | ORAL_TABLET | Freq: Every day | ORAL | 0 refills | Status: DC

## 2020-12-04 MED ORDER — SITAGLIPTIN PHOSPHATE 25 MG PO TABS: 25.0000 mg | ORAL_TABLET | Freq: Every day | ORAL | 1 refills | Status: DC

## 2020-12-04 MED ORDER — BUSPIRONE HCL 10 MG PO TABS: 10.0000 mg | ORAL_TABLET | Freq: Every day | ORAL | 0 refills | Status: DC

## 2020-12-04 MED ORDER — FAMOTIDINE 20 MG PO TABS: ORAL_TABLET | ORAL | 11 refills | Status: DC

## 2020-12-04 MED ORDER — METFORMIN HCL ER 750 MG PO TB24: 750.0000 mg | ORAL_TABLET | Freq: Every day | ORAL | 0 refills | Status: DC

## 2020-12-04 MED ORDER — CYCLOBENZAPRINE HCL 10 MG PO TABS: 10.0000 mg | ORAL_TABLET | Freq: Three times a day (TID) | ORAL | 2 refills | Status: DC | PRN

## 2020-12-04 MED ORDER — ROSUVASTATIN CALCIUM 40 MG PO TABS: 40.0000 mg | ORAL_TABLET | Freq: Every day | ORAL | 1 refills | Status: DC

## 2020-12-05 ENCOUNTER — Other Ambulatory Visit: Payer: Self-pay | Admitting: Nurse Practitioner

## 2020-12-05 DIAGNOSIS — E119 Type 2 diabetes mellitus without complications: Secondary | ICD-10-CM

## 2020-12-05 MED ORDER — SITAGLIPTIN PHOSPHATE 100 MG PO TABS: 100.0000 mg | ORAL_TABLET | Freq: Every day | ORAL | 1 refills | Status: DC

## 2021-02-08 ENCOUNTER — Other Ambulatory Visit: Payer: Self-pay

## 2021-02-08 DIAGNOSIS — R252 Cramp and spasm: Secondary | ICD-10-CM

## 2021-02-08 MED ORDER — CYCLOBENZAPRINE HCL 10 MG PO TABS: 10.0000 mg | ORAL_TABLET | Freq: Three times a day (TID) | ORAL | 2 refills | Status: DC | PRN

## 2021-02-19 ENCOUNTER — Other Ambulatory Visit: Payer: Self-pay | Admitting: Nurse Practitioner

## 2021-02-19 DIAGNOSIS — E119 Type 2 diabetes mellitus without complications: Secondary | ICD-10-CM

## 2021-02-21 ENCOUNTER — Other Ambulatory Visit: Payer: Self-pay

## 2021-02-21 MED ORDER — LIDOCAINE 5 % EX PTCH: 1.0000 | MEDICATED_PATCH | CUTANEOUS | 1 refills | Status: DC

## 2021-02-21 MED ORDER — ALBUTEROL SULFATE HFA 108 (90 BASE) MCG/ACT IN AERS: INHALATION_SPRAY | RESPIRATORY_TRACT | 0 refills | Status: DC

## 2021-02-26 NOTE — Progress Notes (Deleted)
Follow Up Note  RE: [name] MRN: [id number] DOB: 06-30-1960 Date of Office Visit: 02/27/2021  Referring provider: [name], [place] Primary care provider: [name], [name]  Chief Complaint: No chief complaint on file.  History of Present Illness: I had the pleasure of seeing [name] for a follow up visit at the [facility] on 02/26/2021. She is a 61 y.o. female, who is being followed for asthma COPD overlap syndrome, tobacco user, allergic rhinoconjunctivitis, adverse food reaction and heartburn. Her previous allergy office visit was on 11/26/2020 with Dr. Maudie Mercury. Today is a regular follow up visit.  Asthma-COPD overlap syndrome (HCC) Past history - 30+ year smoking history and follows with pulmonology. Noted worsening respiratory symptoms the past 2 years. Currently on Stiolto 2 puffs once a day and using albuterol every other day with good benefit. Takes PPI for reflux. eos 200, IgE 427. 2022 spirometry showed: restrictive disease with 11% improvement in FEV1 post bronchodilator treatment. Clinically feeling improved.  Interim history - doing better with Trelegy but still using albuterol every other day. Today's spirometry showed some restriction. Daily controller medication(s): Increase Trelegy to 228mg 1 puff once a day and rinse mouth after each use. Sample given.  May use albuterol rescue inhaler 2 puffs every 4 to 6 hours as needed for shortness of breath, chest tightness, coughing, and wheezing. May use albuterol rescue inhaler 2 puffs 5 to 15 minutes prior to strenuous physical activities. Monitor frequency of use.  Get spirometry at next visit. If no improvement, consider starting biologics.    Tobacco user Smokes 1/2 ppd. Encouraged smoking cessation.   Seasonal and perennial allergic rhinoconjunctivitis Past history - Rhino conjunctivitis symptoms mainly in the spring and fall for 50+ years. On AIT briefly but stopped due to hives and  tingling sensation in the face. 2022 skin testing showed: Positive to grass, ragweed, dust mites and feathers.  Interim history - does not like to use nasal sprays. Continue environmental control measures as below. Continue Singulair (montelukast) 152mdaily at night. Use over the counter antihistamines such as Zyrtec (cetirizine), Claritin (loratadine), Allegra (fexofenadine), or Xyzal (levocetirizine) daily as needed. May take twice a day during allergy flares. May switch antihistamines every few months.   Other adverse food reactions, not elsewhere classified, subsequent encounter Past history - Shellfish caused tingling on her face, some finned fish cause diarrhea, peanuts cause tingling and hives, sesame seeds and poppy seeds cause hives. 2022 skin testing was negative to select foods. 2022 bloodwork was positive to shrimp only. Borderline positive to clam, scallop and lobster. Negative to fish panel, tree nuts, peanuts, sesame seed and poppy seed. Food: Continue to avoid shellfish, peanuts, sesame seeds, poppy seeds. Not interested in food challenges.  Okay to eat fish as before.  For mild symptoms you can take over the counter antihistamines such as Benadryl and monitor symptoms closely. If symptoms worsen or if you have severe symptoms including breathing issues, throat closure, significant swelling, whole body hives, severe diarrhea and vomiting, lightheadedness then inject epinephrine and seek immediate medical care afterwards. Action plan in place.    Heartburn Stable.  Continue lifestyle and dietary modifications. Continue Protonix 4051maily as before. Nothing to eat/drink for 30 minutes afterwards.  Continue famotidine 34m56mily at night as before.    Return in about 3 months (around 02/26/2021).  Assessment and Plan: [name] 60 y6. female with: No problem-specific Assessment & Plan notes found for this encounter.  No  follow-ups on file.  No orders of the defined types  were placed in this encounter.  Lab Orders  No laboratory test(s) ordered today    Diagnostics: Spirometry:  Tracings reviewed. Her effort: {Blank single:19197::"Good reproducible efforts.","It was hard to get consistent efforts and there is a question as to whether this reflects a maximal maneuver.","Poor effort, data can not be interpreted."} FVC: ***L FEV1: ***L, ***% predicted FEV1/FVC ratio: ***% Interpretation: {Blank single:19197::"Spirometry consistent with mild obstructive disease","Spirometry consistent with moderate obstructive disease","Spirometry consistent with severe obstructive disease","Spirometry consistent with possible restrictive disease","Spirometry consistent with mixed obstructive and restrictive disease","Spirometry uninterpretable due to technique","Spirometry consistent with normal pattern","No overt abnormalities noted given today's efforts"}.  Please see scanned spirometry results for details.  Skin Testing: {Blank single:19197::"Select foods","Environmental allergy panel","Environmental allergy panel and select foods","Food allergy panel","None","Deferred due to recent antihistamines use"}. *** Results discussed with patient/family.   Medication List:  Current Outpatient Medications  Medication Sig Dispense Refill   albuterol (VENTOLIN HFA) 108 (90 Base) MCG/ACT inhaler INHALE 1 PUFF BY MOUTH EVERY 6 HOURS AS NEEDED FOR WHEEZING OR SHORTNESS OF BREATH 9 g 0   alum & mag hydroxide-simeth (MAALOX/MYLANTA) 200-200-20 MG/5ML suspension Take 15 mLs by mouth every 4 (four) hours as needed for indigestion or heartburn. 355 mL 0   aspirin 81 MG chewable tablet Chew 81 mg by mouth daily.     blood glucose meter kit and supplies KIT Dispense based on patient and insurance preference. Use up to four times daily as directed. (FOR ICD-9 250.00, 250.01). 1 each 0   Budeson-Glycopyrrol-Formoterol (BREZTRI AEROSPHERE) 160-9-4.8 MCG/ACT AERO Inhale 2 puffs into the lungs  in the morning and at bedtime. with spacer and rinse mouth afterwards. 10.7 g 3   busPIRone (BUSPAR) 10 MG tablet Take 1 tablet (10 mg total) by mouth daily. 90 tablet 0   cyclobenzaprine (FLEXERIL) 10 MG tablet Take 1 tablet (10 mg total) by mouth 3 (three) times daily as needed for muscle spasms. 30 tablet 2   DULoxetine (CYMBALTA) 60 MG capsule Take 1 capsule (60 mg total) by mouth daily. 90 capsule 1   EPINEPHrine 0.3 mg/0.3 mL IJ SOAJ injection Inject 0.3 mg into the muscle as needed for anaphylaxis. 1 each 2   famotidine (PEPCID) 20 MG tablet One after supper 30 tablet 11   Fluticasone-Umeclidin-Vilant (TRELEGY ELLIPTA) 200-62.5-25 MCG/INH AEPB Inhale 1 puff into the lungs daily. Rinse mouth after each use. 60 each 5   gabapentin (NEURONTIN) 300 MG capsule Take 1 capsule (300 mg total) by mouth 3 (three) times daily. 180 capsule 0   HYDROcodone bit-homatropine (HYDROMET) 5-1.5 MG/5ML syrup Take 5 mLs by mouth every 6 (six) hours as needed for cough. 120 mL 0   lidocaine (LIDODERM) 5 % Place 1 patch onto the skin daily. Remove & Discard patch within 12 hours or as directed by MD 30 patch 1   Magnesium 250 MG TABS Take 1 tablet (250 mg total) by mouth daily. Take with evening meal (Patient taking differently: Take 1 tablet by mouth as needed. Take with evening meal EVERY OTHER DAY) 90 tablet 1   Melatonin 5 MG TABS Take 1 tablet by mouth as needed.      metFORMIN (GLUCOPHAGE-XR) 750 MG 24 hr tablet TAKE ONE TABLET BY MOUTH EVERY DAY WITH BREAKFAST 90 tablet 0   montelukast (SINGULAIR) 10 MG tablet Take 1 tablet (10 mg total) by mouth at bedtime. 30 tablet 5   olmesartan (BENICAR) 5 MG tablet Take 1 tablet (5  mg total) by mouth daily. 90 tablet 0   pantoprazole (PROTONIX) 40 MG tablet TAKE 1 TABLET BY MOUTH ONCE DAILY(TAKE 30-60 MINUTES BEFORE FIRST MEAL OF THE DAY) 90 tablet 1   rosuvastatin (CRESTOR) 40 MG tablet Take 1 tablet (40 mg total) by mouth daily. 90 tablet 1    sitaGLIPtin (JANUVIA) 100 MG tablet Take 1 tablet (100 mg total) by mouth daily. 90 tablet 1   Tetrahydrozoline HCl (VISINE OP) Place 1 drop into both eyes daily.     No current facility-administered medications for this visit.   Allergies: Allergies  Allergen Reactions   Tylenol [Acetaminophen] Itching   Pollen Extract    Shellfish Allergy Hives   Peanut-Containing Drug Products Hives   I reviewed her past medical history, social history, family history, and environmental history and no significant changes have been reported from her previous visit.  Review of Systems  Constitutional:  Negative for appetite change, chills, fever and unexpected weight change.  HENT:  Positive for congestion. Negative for rhinorrhea and sneezing.   Eyes:  Negative for itching.  Respiratory:  Positive for cough. Negative for chest tightness, shortness of breath and wheezing.   Cardiovascular:  Negative for chest pain.  Gastrointestinal:  Negative for abdominal pain.  Genitourinary:  Negative for difficulty urinating.  Skin:  Negative for rash.  Allergic/Immunologic: Positive for environmental allergies.  Neurological:  Positive for headaches.   Objective: There were no vitals taken for this visit. There is no height or weight on file to calculate BMI. Physical Exam Vitals and nursing note reviewed.  Constitutional:      Appearance: Normal appearance. She is well-developed.  HENT:     Head: Normocephalic and atraumatic.     Right Ear: External ear normal.     Left Ear: External ear normal.     Nose: Nose normal.     Mouth/Throat:     Mouth: Mucous membranes are moist.     Pharynx: Oropharynx is clear.  Eyes:     Conjunctiva/sclera: Conjunctivae normal.  Cardiovascular:     Rate and Rhythm: Normal rate and regular rhythm.     Heart sounds: Normal heart sounds. No murmur heard.   No friction rub. No gallop.  Pulmonary:     Effort: Pulmonary effort is normal.     Breath sounds: Normal  breath sounds. No wheezing, rhonchi or rales.  Abdominal:     Palpations: Abdomen is soft.  Musculoskeletal:     Cervical back: Neck supple.  Skin:    General: Skin is warm.     Findings: No rash.  Neurological:     Mental Status: She is alert and oriented to person, place, and time.  Psychiatric:        Behavior: Behavior normal.  Previous notes and tests were reviewed. The plan was reviewed with the patient/family, and all questions/concerned were addressed.  It was my pleasure to see [name] today and participate in her care. Please feel free to contact me with any questions or concerns.  Sincerely,  Rexene Alberts, DO Allergy & Immunology  [facility] office: [place] office: [phone]

## 2021-02-27 ENCOUNTER — Ambulatory Visit: Admitting: Allergy

## 2021-02-27 DIAGNOSIS — Z720 Tobacco use: Secondary | ICD-10-CM

## 2021-02-27 DIAGNOSIS — H1010 Acute atopic conjunctivitis, unspecified eye: Secondary | ICD-10-CM

## 2021-02-27 DIAGNOSIS — J449 Chronic obstructive pulmonary disease, unspecified: Secondary | ICD-10-CM

## 2021-02-27 DIAGNOSIS — T781XXD Other adverse food reactions, not elsewhere classified, subsequent encounter: Secondary | ICD-10-CM

## 2021-02-27 DIAGNOSIS — R12 Heartburn: Secondary | ICD-10-CM

## 2021-03-06 ENCOUNTER — Encounter: Admitting: Nurse Practitioner

## 2021-04-11 ENCOUNTER — Other Ambulatory Visit: Payer: Self-pay | Admitting: Nurse Practitioner

## 2021-04-11 DIAGNOSIS — F419 Anxiety disorder, unspecified: Secondary | ICD-10-CM

## 2021-04-23 ENCOUNTER — Other Ambulatory Visit: Payer: Self-pay | Admitting: Nurse Practitioner

## 2021-04-29 ENCOUNTER — Other Ambulatory Visit: Payer: Self-pay | Admitting: Nurse Practitioner

## 2021-04-30 ENCOUNTER — Encounter: Payer: Self-pay | Admitting: Nurse Practitioner

## 2021-04-30 ENCOUNTER — Other Ambulatory Visit: Payer: Self-pay

## 2021-04-30 ENCOUNTER — Emergency Department (HOSPITAL_COMMUNITY)

## 2021-04-30 ENCOUNTER — Ambulatory Visit (INDEPENDENT_AMBULATORY_CARE_PROVIDER_SITE_OTHER): Admitting: Nurse Practitioner

## 2021-04-30 ENCOUNTER — Encounter (HOSPITAL_COMMUNITY): Payer: Self-pay

## 2021-04-30 ENCOUNTER — Inpatient Hospital Stay (HOSPITAL_COMMUNITY)
Admission: EM | Admit: 2021-04-30 | Discharge: 2021-05-03 | DRG: 683 | Disposition: A | Discharge status: Home / Self Care | Attending: Internal Medicine | Admitting: Internal Medicine

## 2021-04-30 VITALS — BP 98/78 | Temp 98.3°F | Ht 60.0 in | Wt 126.0 lb

## 2021-04-30 DIAGNOSIS — E7800 Pure hypercholesterolemia, unspecified: Secondary | ICD-10-CM | POA: Diagnosis present

## 2021-04-30 DIAGNOSIS — E1143 Type 2 diabetes mellitus with diabetic autonomic (poly)neuropathy: Secondary | ICD-10-CM | POA: Diagnosis not present

## 2021-04-30 DIAGNOSIS — F419 Anxiety disorder, unspecified: Secondary | ICD-10-CM | POA: Diagnosis present

## 2021-04-30 DIAGNOSIS — Z91010 Allergy to peanuts: Secondary | ICD-10-CM

## 2021-04-30 DIAGNOSIS — Z7951 Long term (current) use of inhaled steroids: Secondary | ICD-10-CM

## 2021-04-30 DIAGNOSIS — J449 Chronic obstructive pulmonary disease, unspecified: Secondary | ICD-10-CM | POA: Diagnosis present

## 2021-04-30 DIAGNOSIS — E1122 Type 2 diabetes mellitus with diabetic chronic kidney disease: Secondary | ICD-10-CM | POA: Diagnosis present

## 2021-04-30 DIAGNOSIS — Z28310 Unvaccinated for covid-19: Secondary | ICD-10-CM | POA: Diagnosis not present

## 2021-04-30 DIAGNOSIS — I959 Hypotension, unspecified: Secondary | ICD-10-CM | POA: Diagnosis present

## 2021-04-30 DIAGNOSIS — I129 Hypertensive chronic kidney disease with stage 1 through stage 4 chronic kidney disease, or unspecified chronic kidney disease: Secondary | ICD-10-CM | POA: Diagnosis present

## 2021-04-30 DIAGNOSIS — Z20822 Contact with and (suspected) exposure to covid-19: Secondary | ICD-10-CM | POA: Diagnosis not present

## 2021-04-30 DIAGNOSIS — E119 Type 2 diabetes mellitus without complications: Secondary | ICD-10-CM

## 2021-04-30 DIAGNOSIS — Z90722 Acquired absence of ovaries, bilateral: Secondary | ICD-10-CM

## 2021-04-30 DIAGNOSIS — Z9851 Tubal ligation status: Secondary | ICD-10-CM

## 2021-04-30 DIAGNOSIS — F32A Depression, unspecified: Secondary | ICD-10-CM | POA: Diagnosis not present

## 2021-04-30 DIAGNOSIS — R112 Nausea with vomiting, unspecified: Secondary | ICD-10-CM | POA: Diagnosis not present

## 2021-04-30 DIAGNOSIS — A084 Viral intestinal infection, unspecified: Secondary | ICD-10-CM | POA: Diagnosis present

## 2021-04-30 DIAGNOSIS — K5790 Diverticulosis of intestine, part unspecified, without perforation or abscess without bleeding: Secondary | ICD-10-CM | POA: Diagnosis not present

## 2021-04-30 DIAGNOSIS — N182 Chronic kidney disease, stage 2 (mild): Secondary | ICD-10-CM | POA: Diagnosis not present

## 2021-04-30 DIAGNOSIS — Z91048 Other nonmedicinal substance allergy status: Secondary | ICD-10-CM

## 2021-04-30 DIAGNOSIS — E878 Other disorders of electrolyte and fluid balance, not elsewhere classified: Secondary | ICD-10-CM | POA: Diagnosis present

## 2021-04-30 DIAGNOSIS — F17210 Nicotine dependence, cigarettes, uncomplicated: Secondary | ICD-10-CM | POA: Diagnosis present

## 2021-04-30 DIAGNOSIS — I1310 Hypertensive heart and chronic kidney disease without heart failure, with stage 1 through stage 4 chronic kidney disease, or unspecified chronic kidney disease: Secondary | ICD-10-CM | POA: Diagnosis present

## 2021-04-30 DIAGNOSIS — Z8249 Family history of ischemic heart disease and other diseases of the circulatory system: Secondary | ICD-10-CM

## 2021-04-30 DIAGNOSIS — M797 Fibromyalgia: Secondary | ICD-10-CM | POA: Diagnosis present

## 2021-04-30 DIAGNOSIS — E871 Hypo-osmolality and hyponatremia: Secondary | ICD-10-CM | POA: Diagnosis not present

## 2021-04-30 DIAGNOSIS — R21 Rash and other nonspecific skin eruption: Secondary | ICD-10-CM

## 2021-04-30 DIAGNOSIS — Z79899 Other long term (current) drug therapy: Secondary | ICD-10-CM | POA: Diagnosis not present

## 2021-04-30 DIAGNOSIS — E785 Hyperlipidemia, unspecified: Secondary | ICD-10-CM

## 2021-04-30 DIAGNOSIS — Z90710 Acquired absence of both cervix and uterus: Secondary | ICD-10-CM

## 2021-04-30 DIAGNOSIS — R051 Acute cough: Secondary | ICD-10-CM

## 2021-04-30 DIAGNOSIS — Z7984 Long term (current) use of oral hypoglycemic drugs: Secondary | ICD-10-CM

## 2021-04-30 DIAGNOSIS — E1165 Type 2 diabetes mellitus with hyperglycemia: Secondary | ICD-10-CM | POA: Diagnosis not present

## 2021-04-30 DIAGNOSIS — R1084 Generalized abdominal pain: Secondary | ICD-10-CM

## 2021-04-30 DIAGNOSIS — E876 Hypokalemia: Secondary | ICD-10-CM | POA: Diagnosis not present

## 2021-04-30 DIAGNOSIS — K529 Noninfective gastroenteritis and colitis, unspecified: Secondary | ICD-10-CM

## 2021-04-30 DIAGNOSIS — Z7982 Long term (current) use of aspirin: Secondary | ICD-10-CM | POA: Diagnosis not present

## 2021-04-30 DIAGNOSIS — F1011 Alcohol abuse, in remission: Secondary | ICD-10-CM

## 2021-04-30 DIAGNOSIS — Z91013 Allergy to seafood: Secondary | ICD-10-CM

## 2021-04-30 DIAGNOSIS — N179 Acute kidney failure, unspecified: Secondary | ICD-10-CM | POA: Diagnosis not present

## 2021-04-30 DIAGNOSIS — Z886 Allergy status to analgesic agent status: Secondary | ICD-10-CM

## 2021-04-30 DIAGNOSIS — R1905 Periumbilic swelling, mass or lump: Secondary | ICD-10-CM

## 2021-04-30 DIAGNOSIS — I700 Atherosclerosis of aorta: Secondary | ICD-10-CM

## 2021-04-30 DIAGNOSIS — E860 Dehydration: Secondary | ICD-10-CM | POA: Diagnosis present

## 2021-04-30 DIAGNOSIS — R000 Tachycardia, unspecified: Secondary | ICD-10-CM

## 2021-04-30 DIAGNOSIS — N170 Acute kidney failure with tubular necrosis: Principal | ICD-10-CM | POA: Diagnosis present

## 2021-04-30 DIAGNOSIS — J439 Emphysema, unspecified: Secondary | ICD-10-CM | POA: Diagnosis present

## 2021-04-30 DIAGNOSIS — Z833 Family history of diabetes mellitus: Secondary | ICD-10-CM

## 2021-04-30 DIAGNOSIS — Z825 Family history of asthma and other chronic lower respiratory diseases: Secondary | ICD-10-CM

## 2021-04-30 DIAGNOSIS — Z79890 Hormone replacement therapy (postmenopausal): Secondary | ICD-10-CM

## 2021-04-30 LAB — CBC
HCT: 44 % (ref 36.0–46.0)
Hemoglobin: 14.1 g/dL (ref 12.0–15.0)
MCH: 29.3 pg (ref 26.0–34.0)
MCHC: 32 g/dL (ref 30.0–36.0)
MCV: 91.5 fL (ref 80.0–100.0)
Platelets: 263 10*3/uL (ref 150–400)
RBC: 4.81 MIL/uL (ref 3.87–5.11)
RDW: 14.4 % (ref 11.5–15.5)
WBC: 8.2 10*3/uL (ref 4.0–10.5)
nRBC: 0.2 % (ref 0.0–0.2)

## 2021-04-30 LAB — BASIC METABOLIC PANEL
Anion gap: 14 (ref 5–15)
BUN: 20 mg/dL (ref 8–23)
CO2: 24 mmol/L (ref 22–32)
Calcium: 8.9 mg/dL (ref 8.9–10.3)
Chloride: 94 mmol/L — ABNORMAL LOW (ref 98–111)
Creatinine, Ser: 2.3 mg/dL — ABNORMAL HIGH (ref 0.44–1.00)
GFR, Estimated: 24 mL/min — ABNORMAL LOW (ref >60)
Glucose, Bld: 192 mg/dL — ABNORMAL HIGH (ref 70–99)
Potassium: 3.6 mmol/L (ref 3.5–5.1)
Sodium: 132 mmol/L — ABNORMAL LOW (ref 135–145)

## 2021-04-30 LAB — POC INFLUENZA A&B (BINAX/QUICKVUE)
Influenza A, POC: NEGATIVE
Influenza B, POC: NEGATIVE

## 2021-04-30 LAB — RESP PANEL BY RT-PCR (FLU A&B, COVID) ARPGX2
Influenza A by PCR: NEGATIVE
Influenza B by PCR: NEGATIVE
SARS Coronavirus 2 by RT PCR: NEGATIVE

## 2021-04-30 LAB — HEPATIC FUNCTION PANEL
ALT: 62 U/L — ABNORMAL HIGH (ref 0–44)
AST: 85 U/L — ABNORMAL HIGH (ref 15–41)
Albumin: 3.9 g/dL (ref 3.5–5.0)
Alkaline Phosphatase: 58 U/L (ref 38–126)
Bilirubin, Direct: 0.2 mg/dL (ref 0.0–0.2)
Indirect Bilirubin: 0.7 mg/dL (ref 0.3–0.9)
Total Bilirubin: 0.9 mg/dL (ref 0.3–1.2)
Total Protein: 7.4 g/dL (ref 6.5–8.1)

## 2021-04-30 LAB — URINALYSIS, MICROSCOPIC (REFLEX)

## 2021-04-30 LAB — TROPONIN I (HIGH SENSITIVITY)
Troponin I (High Sensitivity): 13 ng/L (ref <18)
Troponin I (High Sensitivity): 11 ng/L (ref <18)

## 2021-04-30 LAB — URINALYSIS, ROUTINE W REFLEX MICROSCOPIC
Glucose, UA: NEGATIVE mg/dL
Hgb urine dipstick: NEGATIVE
Ketones, ur: NEGATIVE mg/dL
Leukocytes,Ua: NEGATIVE
Nitrite: NEGATIVE
Protein, ur: 30 mg/dL — AB
Specific Gravity, Urine: 1.02 (ref 1.005–1.030)
pH: 5.5 (ref 5.0–8.0)

## 2021-04-30 LAB — LIPASE, BLOOD: Lipase: 73 U/L — ABNORMAL HIGH (ref 11–51)

## 2021-04-30 LAB — POC COVID19 BINAXNOW: SARS Coronavirus 2 Ag: NEGATIVE

## 2021-04-30 MED ORDER — NYSTATIN 100000 UNIT/GM EX POWD: 1.0000 "application " | Freq: Three times a day (TID) | CUTANEOUS | 2 refills | Status: DC

## 2021-04-30 MED ORDER — ONDANSETRON 4 MG PO TBDP: 4.0000 mg | ORAL_TABLET | Freq: Once | ORAL | Status: DC

## 2021-04-30 MED ORDER — ROSUVASTATIN CALCIUM 40 MG PO TABS: 40.0000 mg | ORAL_TABLET | Freq: Every day | ORAL | 1 refills | Status: DC

## 2021-04-30 MED ORDER — ONDANSETRON HCL 4 MG/2ML IJ SOLN
4.0000 mg | Freq: Once | INTRAMUSCULAR | Status: AC
  Administered 2021-04-30: 4 mg via INTRAVENOUS
  Filled 2021-04-30: qty 2

## 2021-04-30 MED ORDER — SODIUM CHLORIDE 0.9 % IV BOLUS
1000.0000 mL | Freq: Once | INTRAVENOUS | Status: AC
  Administered 2021-04-30: 1000 mL via INTRAVENOUS

## 2021-04-30 NOTE — Addendum Note (Signed)
Addended by: Nelda Bucks on: 04/30/2021 03:58 PM ? ? Modules accepted: Orders ? ?

## 2021-04-30 NOTE — ED Notes (Signed)
Pt returned from CT °

## 2021-04-30 NOTE — ED Triage Notes (Signed)
Patient complains of sob and dizziness. Complains of nausea with any intake and vomiting. Denis trauma. Reports all symptoms started on saturday ?

## 2021-04-30 NOTE — Progress Notes (Addendum)
I,Katawbba Wiggins,acting as a Education administrator for [employer], [name].,have documented all relevant documentation on the behalf of [name], [name],as directed by  [name], [name] while in the presence of [name], [place].   This visit occurred during the SARS-CoV-2 public health emergency.  Safety protocols were in place, including screening questions prior to the visit, additional usage of staff PPE, and extensive cleaning of exam room while observing appropriate contact time as indicated for disinfecting solutions.  Subjective:     Patient ID: [name] , female    DOB: April 17, 1960 , 61 y.o.   MRN: [id number]   Chief Complaint  Patient presents with   Diabetes    HPI  Patient presents today for a diabetes and depression f/u. She feels like her mood is fairly good. Reports she has been having vomiting since Saturday and is unable to keep anything down. She is also having coughing and runny nose.   She does report a history of increased alcohol use in the past but has not had a drink rum in 1.5 weeks.   Diabetes She presents for her follow-up diabetic visit. She has type 2 diabetes mellitus. Her disease course has been stable. Hypoglycemia symptoms include dizziness, headaches (Couple of headaches and will take Tylenol with good effect.) and tremors (she has some fine tremors). Associated symptoms include fatigue. Pertinent negatives for diabetes include no chest pain, no polydipsia, no polyphagia and no polyuria. There are no hypoglycemic complications. There are no diabetic complications. Risk factors for coronary artery disease include sedentary lifestyle, hypertension and diabetes mellitus. Current diabetic treatment includes oral agent (monotherapy). She is compliant with treatment all of the time. She has not had a previous visit with a dietitian. She rarely participates in exercise. An ACE inhibitor/angiotensin II receptor blocker is being taken. She does not see a podiatrist.Eye exam  is not current.  Depression        This is a recurrent problem.  The current episode started more than 1 month ago.   The problem has been gradually improving since onset.  Associated symptoms include fatigue, insomnia (will take melatonin every other night) and headaches (Couple of headaches and will take Tylenol with good effect.).  Associated symptoms include no appetite change.  Compliance with treatment is good.  Risk factors: recent death of her husband.    Pertinent negatives include no anxiety. Insomnia Primary symptoms: sleep disturbance, no difficulty falling asleep, no frequent awakening.   PMH includes: depression.   Hypertension This is a chronic problem. The current episode started more than 1 year ago. The problem is unchanged. The problem is controlled. Associated symptoms include headaches (Couple of headaches and will take Tylenol with good effect.) and shortness of breath (intermittent with exertion). Pertinent negatives include no anxiety, chest pain or palpitations. There are no associated agents to hypertension. Risk factors for coronary artery disease include obesity and sedentary lifestyle. Past treatments include angiotensin blockers. There are no compliance problems.  There is no history of angina or kidney disease. There is no history of chronic renal disease.    Past Medical History:  Diagnosis Date   Acute kidney failure with lesion of tubular necrosis ([place]) 11/11/2016   Anxiety    Back pain    Diabetes mellitus    Essential hypertension 11/11/2016   Fibromyalgia    High cholesterol    Hypertension    Syncope 11/11/2016     Family History  Problem Relation Age of Onset   Allergic rhinitis Mother  Hypertension Mother    Heart failure Mother    Allergic rhinitis Father    Diabetes Father    COPD Father    Emphysema Father    Heart failure Maternal Grandmother    Cancer Maternal Grandfather        LUNG    Heart disease Maternal Grandfather    Lung cancer  Maternal Grandfather    Bronchitis Daughter        TWICE YEARLY   Allergic rhinitis Daughter    Bronchitis Son        YEARLY   Allergic rhinitis Son    Food Allergy Grandchild    Asthma Neg Hx    Immunodeficiency Neg Hx    Eczema Neg Hx    Atopy Neg Hx    Angioedema Neg Hx      Current Outpatient Medications:    albuterol (VENTOLIN HFA) 108 (90 Base) MCG/ACT inhaler, INHALE 1 PUFF BY MOUTH EVERY 6 HOURS AS NEEDED FOR WHEEZING OR SHORTNESS OF BREATH, Disp: 9 g, Rfl: 0   aspirin 81 MG chewable tablet, Chew 81 mg by mouth daily., Disp: , Rfl:    blood glucose meter kit and supplies KIT, Dispense based on patient and insurance preference. Use up to four times daily as directed. (FOR ICD-9 250.00, 250.01)., Disp: 1 each, Rfl: 0   busPIRone (BUSPAR) 10 MG tablet, TAKE ONE TABLET BY MOUTH EVERY DAY, Disp: 30 tablet, Rfl: 0   cyclobenzaprine (FLEXERIL) 10 MG tablet, Take 1 tablet (10 mg total) by mouth 3 (three) times daily as needed for muscle spasms., Disp: 30 tablet, Rfl: 2   DULoxetine (CYMBALTA) 60 MG capsule, Take 1 capsule (60 mg total) by mouth daily., Disp: 90 capsule, Rfl: 1   EPINEPHrine 0.3 mg/0.3 mL IJ SOAJ injection, Inject 0.3 mg into the muscle as needed for anaphylaxis., Disp: 1 each, Rfl: 2   famotidine (PEPCID) 20 MG tablet, One after supper, Disp: 30 tablet, Rfl: 11   Fluticasone-Umeclidin-Vilant (TRELEGY ELLIPTA) 200-62.5-25 MCG/INH AEPB, Inhale 1 puff into the lungs daily. Rinse mouth after each use., Disp: 60 each, Rfl: 5   gabapentin (NEURONTIN) 300 MG capsule, Take 1 capsule (300 mg total) by mouth 3 (three) times daily., Disp: 180 capsule, Rfl: 0   lidocaine (LIDODERM) 5 %, Place 1 patch onto the skin daily. Remove & Discard patch within 12 hours or as directed by MD, Disp: 30 patch, Rfl: 1   Magnesium 250 MG TABS, Take 1 tablet (250 mg total) by mouth daily. Take with evening meal (Patient taking differently: Take 1 tablet by mouth as needed. Take with evening meal  EVERY OTHER DAY), Disp: 90 tablet, Rfl: 1   Melatonin 5 MG TABS, Take 1 tablet by mouth as needed. , Disp: , Rfl:    metFORMIN (GLUCOPHAGE-XR) 750 MG 24 hr tablet, TAKE ONE TABLET BY MOUTH EVERY DAY WITH BREAKFAST, Disp: 90 tablet, Rfl: 0   nystatin powder, Apply 1 application. topically 3 (three) times daily., Disp: 15 g, Rfl: 2   olmesartan (BENICAR) 5 MG tablet, TAKE ONE TABLET BY MOUTH EVERY DAY, Disp: 90 tablet, Rfl: 0   pantoprazole (PROTONIX) 40 MG tablet, TAKE 1 TABLET BY MOUTH ONCE DAILY(TAKE 30-60 MINUTES BEFORE FIRST MEAL OF THE DAY), Disp: 90 tablet, Rfl: 1   sitaGLIPtin (JANUVIA) 100 MG tablet, Take 1 tablet (100 mg total) by mouth daily., Disp: 90 tablet, Rfl: 1   Tetrahydrozoline HCl (VISINE OP), Place 1 drop into both eyes daily., Disp: , Rfl:  alum & mag hydroxide-simeth (MAALOX/MYLANTA) 200-200-20 MG/5ML suspension, Take 15 mLs by mouth every 4 (four) hours as needed for indigestion or heartburn. (Patient not taking: Reported on 04/30/2021), Disp: 355 mL, Rfl: 0   HYDROcodone bit-homatropine (HYDROMET) 5-1.5 MG/5ML syrup, Take 5 mLs by mouth every 6 (six) hours as needed for cough. (Patient not taking: Reported on 04/30/2021), Disp: 120 mL, Rfl: 0   rosuvastatin (CRESTOR) 40 MG tablet, Take 1 tablet (40 mg total) by mouth daily., Disp: 90 tablet, Rfl: 1   Allergies  Allergen Reactions   Tylenol [Acetaminophen] Itching   Pollen Extract    Shellfish Allergy Hives   Peanut-Containing Drug Products Hives     Review of Systems  Constitutional:  Positive for fatigue. Negative for appetite change.  Respiratory:  Positive for shortness of breath (intermittent with exertion).   Cardiovascular:  Negative for chest pain, palpitations and leg swelling.  Endocrine: Negative for polydipsia, polyphagia and polyuria.  Neurological:  Positive for dizziness, tremors (she has some fine tremors) and headaches (Couple of headaches and will take Tylenol with good effect.).   Psychiatric/Behavioral:  Positive for depression and sleep disturbance. The patient has insomnia (will take melatonin every other night).     Today's Vitals   04/30/21 1239  BP: 98/78  Temp: 98.3 F (36.8 C)  Weight: 126 lb (57.2 kg)  Height: 5' (1.524 m)   Body mass index is 24.61 kg/m.   Objective:  Physical Exam Vitals reviewed.  Constitutional:      General: She is not in acute distress.    Appearance: Normal appearance.  Cardiovascular:     Rate and Rhythm: Tachycardia present.     Heart sounds: Normal heart sounds. No murmur heard. Pulmonary:     Effort: Pulmonary effort is normal. No respiratory distress.     Breath sounds: No wheezing.  Abdominal:     General: Abdomen is flat. Bowel sounds are normal. There is no distension.     Palpations: Abdomen is soft. There is mass.     Tenderness: There is abdominal tenderness.    Skin:    Coloration: Skin is pale.  Neurological:     General: No focal deficit present.     Mental Status: She is alert and oriented to person, place, and time.     Cranial Nerves: No cranial nerve deficit.     Motor: No weakness.     Comments: Mild tremors noted to feet bilateral  Psychiatric:        Mood and Affect: Mood normal.        Behavior: Behavior normal.        Thought Content: Thought content normal.        Judgment: Judgment normal.        Assessment And Plan:     1. Nausea and vomiting, unspecified vomiting type Comments: I have advised her to go to the ER due to the vomiting and unable to keep anything down. She appears to be dehydrated. I have advised her to stop Januvia for now until she stops vomiting. I will call to the ER to see if they can add an HgbA1c to her labs, her levels were elevated at the last visit. Negative rapid covid and influenza A/B - POC COVID-19 BinaxNow - POC Influenza A&B(BINAX/QUICKVUE)  2. Depression, unspecified depression type Will address at next visit,  3. Rash and nonspecific skin  eruption Red rash and irritated skin will treat with treat with nystatin - nystatin powder; Apply 1 application.  topically 3 (three) times daily.  Dispense: 15 g; Refill: 2  4. Acute cough Covid test done which was negative.   5. Type 2 diabetes mellitus without complication, without long-term current use of insulin (HCC) Fingerstick blood sugar 258, I have advised to hold januvia when vomiting.   - Hemoglobin A1c   6. Tachycardia EKG done with HR 118, may be related to dehydration, will send to ER Blood pressure was difficult to hear. Report given to [facility] at [facility] ER  7. History of alcohol abuse Reports she has not had any alcohol 1.5 weeks.    8. Generalized abdominal pain Tenderness to upper abdomen and firm area to periumbilical area  9. Periumbilical mass Firm area to mid abdomen    Patient was given opportunity to ask questions. Patient verbalized understanding of the plan and was able to repeat key elements of the plan. All questions were answered to their satisfaction.  [name], [name]   I, [name], [name], have reviewed all documentation for this visit. The documentation on 04/30/21 for the exam, diagnosis, procedures, and orders are all accurate and complete.   IF YOU HAVE BEEN REFERRED TO A SPECIALIST, IT MAY TAKE 1-2 WEEKS TO SCHEDULE/PROCESS THE REFERRAL. IF YOU HAVE NOT HEARD FROM US/SPECIALIST IN TWO WEEKS, PLEASE GIVE [place] A CALL AT [phone].   THE PATIENT IS ENCOURAGED TO PRACTICE SOCIAL DISTANCING DUE TO THE COVID-19 PANDEMIC.

## 2021-04-30 NOTE — ED Provider Notes (Signed)
[facility] EMERGENCY DEPARTMENT Provider Note   CSN: [id number] Arrival date & time: 04/30/21  1338     History  No chief complaint on file.   [name] is a 61 y.o. female.  HPI  61 year old female with past medical history of HTN, HLD, DM presents to the emergency department with concern for nausea/vomiting and dizziness/shortness of breath.  Patient states for the last 3 days she has been having ongoing nausea/vomiting.  She has not been able to tolerate even small amount of fluid.  She had decreased bowel movements with some loose stools.  She is also had a significant decrease in her urine output.  Denies any fever, no recent sick contacts.  She has generalized abdominal cramping and sharp shooting pain that is not well localized.  No flank pain, dysuria.  Home Medications Prior to Admission medications   Medication Sig Start Date End Date Taking? Authorizing Provider  albuterol (VENTOLIN HFA) 108 (90 Base) MCG/ACT inhaler INHALE 1 PUFF BY MOUTH EVERY 6 HOURS AS NEEDED FOR WHEEZING OR SHORTNESS OF BREATH 02/21/21   [name], [name]  alum & mag hydroxide-simeth (MAALOX/MYLANTA) 200-200-20 MG/5ML suspension Take 15 mLs by mouth every 4 (four) hours as needed for indigestion or heartburn. Patient not taking: Reported on 04/30/2021 07/23/17   Patrecia Pour, Christean Grief, MD  aspirin 81 MG chewable tablet Chew 81 mg by mouth daily.    [provider]  blood glucose meter kit and supplies KIT Dispense based on patient and insurance preference. Use up to four times daily as directed. (FOR ICD-9 250.00, 250.01). 07/29/18   [name], [name]  busPIRone (BUSPAR) 10 MG tablet TAKE ONE TABLET BY MOUTH EVERY DAY 04/11/21   [name], [name]  cyclobenzaprine (FLEXERIL) 10 MG tablet Take 1 tablet (10 mg total) by mouth 3 (three) times daily as needed for muscle spasms. 02/08/21   [name], [name]  DULoxetine (CYMBALTA) 60 MG capsule Take 1 capsule (60 mg total) by mouth  daily. 12/04/20   [name], [name]  EPINEPHrine 0.3 mg/0.3 mL IJ SOAJ injection Inject 0.3 mg into the muscle as needed for anaphylaxis. 08/23/20   [name], [name]  famotidine (PEPCID) 20 MG tablet One after supper 12/04/20   [name], [name]  Fluticasone-Umeclidin-Vilant (TRELEGY ELLIPTA) 200-62.5-25 MCG/INH AEPB Inhale 1 puff into the lungs daily. Rinse mouth after each use. 11/30/20   Garnet Sierras, DO  gabapentin (NEURONTIN) 300 MG capsule Take 1 capsule (300 mg total) by mouth 3 (three) times daily. 12/04/20   [name], [name]  HYDROcodone bit-homatropine (HYDROMET) 5-1.5 MG/5ML syrup Take 5 mLs by mouth every 6 (six) hours as needed for cough. Patient not taking: Reported on 04/30/2021 08/23/20   [name], [name]  lidocaine (LIDODERM) 5 % Place 1 patch onto the skin daily. Remove & Discard patch within 12 hours or as directed by MD 02/21/21   [name], [name]  Magnesium 250 MG TABS Take 1 tablet (250 mg total) by mouth daily. Take with evening meal Patient taking differently: Take 1 tablet by mouth as needed. Take with evening meal EVERY OTHER DAY 05/03/20   [name], [name]  Melatonin 5 MG TABS Take 1 tablet by mouth as needed.     [provider]  metFORMIN (GLUCOPHAGE-XR) 750 MG 24 hr tablet TAKE ONE TABLET BY MOUTH EVERY DAY WITH BREAKFAST 02/20/21   [name], [name]  nystatin powder Apply 1 application. topically 3 (three) times daily. 04/30/21   [name],  [name]  olmesartan (BENICAR) 5 MG tablet TAKE ONE TABLET BY MOUTH EVERY DAY 04/30/21   [name], [name]  pantoprazole (PROTONIX) 40 MG tablet TAKE 1 TABLET BY MOUTH ONCE DAILY(TAKE 30-60 MINUTES BEFORE FIRST MEAL OF THE DAY) 12/04/20   [name], [name]  rosuvastatin (CRESTOR) 40 MG tablet Take 1 tablet (40 mg total) by mouth daily. 04/30/21   [name], [name]  sitaGLIPtin (JANUVIA) 100 MG tablet Take 1 tablet (100 mg total) by mouth daily. 12/05/20   [name], [name]  Tetrahydrozoline HCl (VISINE OP) Place 1 drop  into both eyes daily.    [provider]      Allergies    Tylenol [acetaminophen], Pollen extract, Shellfish allergy, and Peanut-containing drug products    Review of Systems   Review of Systems  Constitutional:  Positive for fatigue. Negative for fever.  Respiratory:  Positive for shortness of breath.   Cardiovascular:  Negative for chest pain.  Gastrointestinal:  Positive for abdominal pain, diarrhea, nausea and vomiting.  Genitourinary:  Positive for decreased urine volume. Negative for dysuria.  Skin:  Negative for rash.  Neurological:  Positive for light-headedness. Negative for headaches.   Physical Exam Updated Vital Signs BP 94/76    Pulse 94    Temp 98.5 F (36.9 C) (Oral)    Resp (!) 22    SpO2 99%  Physical Exam Vitals and nursing note reviewed.  Constitutional:      General: She is not in acute distress.    Appearance: Normal appearance.  HENT:     Head: Normocephalic.     Mouth/Throat:     Mouth: Mucous membranes are moist.  Cardiovascular:     Rate and Rhythm: Normal rate.  Pulmonary:     Effort: Pulmonary effort is normal. No respiratory distress.  Abdominal:     General: There is no distension.     Palpations: Abdomen is soft.     Tenderness: There is abdominal tenderness. There is no guarding or rebound.  Skin:    General: Skin is warm.  Neurological:     Mental Status: She is alert and oriented to person, place, and time. Mental status is at baseline.  Psychiatric:        Mood and Affect: Mood normal.    ED Results / Procedures / Treatments   Labs (all labs ordered are listed, but only abnormal results are displayed) Labs Reviewed  BASIC METABOLIC PANEL - Abnormal; Notable for the following components:      Result Value   Sodium 132 (*)    Chloride 94 (*)    Glucose, Bld 192 (*)    Creatinine, Ser 2.30 (*)    GFR, Estimated 24 (*)    All other components within normal limits  RESP PANEL BY RT-PCR (FLU A&B, COVID) ARPGX2  CBC   URINALYSIS, ROUTINE W REFLEX MICROSCOPIC  LIPASE, BLOOD  HEPATIC FUNCTION PANEL  TROPONIN I (HIGH SENSITIVITY)  TROPONIN I (HIGH SENSITIVITY)    EKG EKG Interpretation  Date/Time:  Tuesday April 30 2021 14:11:46 EST Ventricular Rate:  115 PR Interval:  146 QRS Duration: 62 QT Interval:  346 QTC Calculation: 478 R Axis:   59 Text Interpretation: Sinus tachycardia Right atrial enlargement Borderline ECG When compared with ECG of 19-Jul-2017 14:43, PREVIOUS ECG IS PRESENT Confirmed by Lavenia Atlas [phone]) on 04/30/2021 6:07:13 PM  Radiology DG Chest 2 View  Result Date: 04/30/2021 CLINICAL DATA:  Shortness of breath. EXAM: CHEST - 2 VIEW COMPARISON:  Chest x-ray  10/27/2018. FINDINGS: The heart size and mediastinal contours are within normal limits. Both lungs are clear. The visualized skeletal structures are unremarkable. IMPRESSION: No active cardiopulmonary disease. Electronically Signed   By: Ronney Asters M.D.   On: 04/30/2021 15:19    Procedures .Critical Care Performed by: Lorelle Gibbs, DO Authorized by: Lorelle Gibbs, DO   Critical care provider statement:    Critical care time (minutes):  45   Critical care time was exclusive of:  Separately billable procedures and treating other patients   Critical care was necessary to treat or prevent imminent or life-threatening deterioration of the following conditions:  Circulatory failure and dehydration   Critical care was time spent personally by me on the following activities:  Development of treatment plan with patient or surrogate, discussions with consultants, evaluation of patient's response to treatment, examination of patient, ordering and review of laboratory studies, ordering and review of radiographic studies, ordering and performing treatments and interventions, pulse oximetry, re-evaluation of patient's condition and review of old charts   I assumed direction of critical care for this patient from another provider  in my specialty: no     Care discussed with: admitting provider      Medications Ordered in ED Medications  ondansetron (ZOFRAN) injection 4 mg (has no administration in time range)  sodium chloride 0.9 % bolus 1,000 mL (1,000 mLs Intravenous Bolus 04/30/21 1800)    ED Course/ Medical Decision Making/ A&P                           Medical Decision Making Amount and/or Complexity of Data Reviewed Labs: ordered. Radiology: ordered.  Risk Prescription drug management. Decision regarding hospitalization.   This patient presents to the ED for concern of nausea/vomiting/generalized abdominal pain, this involves an extensive number of treatment options, and is a complaint that carries with it a high risk of complications and morbidity.  The differential diagnosis includes gastritis, gastroenteritis, abdominal pathology/infection, dehydration, AKI, hypotension   Additional history obtained: -External records from outside source obtained and reviewed including: Chart review including previous notes, labs, imaging, consultation notes   Lab Tests: -I ordered, reviewed, and interpreted labs.  The pertinent results include: Flu and COVID-negative, mild transaminitis and lipase of 73, troponin is negative, elevated creatinine at 2.30, normal white blood cells   EKG -Sinus tachycardia   Imaging Studies ordered: -I ordered imaging studies including CT of the abdomen pelvis -I independently visualized and interpreted imaging which showed no acute findings -I agree with the radiologist interpretation   Medicines ordered and prescription drug management: -I ordered medication including IV fluids and medication for hypotension and symptoms -Reevaluation of the patient after these medicines showed that the patient improved -I have reviewed the patients home medicines and have made adjustments as needed   ED Course: 61 year old female presents emergency room with 3 days of nausea/vomiting,  dehydration.  Hypotensive on arrival.  Blood work shows new AKI.  Very mild transaminitis/elevated lipase.  History of pancreatitis in the past.  CT of the abdomen pelvis is reassuring.  Patient's hypotension has been fluid responsive.  Flu and COVID is negative.  Given degree of dehydration/hypotension with AKI we will plan for medical admission and further treatment.   Critical Interventions: IV fluids for hypotension   Cardiac Monitoring: The patient was maintained on a cardiac monitor.  I personally viewed and interpreted the cardiac monitored which showed an underlying rhythm of: Sinus rhythm  Reevaluation: After the interventions noted above, I reevaluated the patient and found that they have :improved   Dispostion: Patients evaluation and results requires admission for further treatment and care.  Spoke with hospitalist Dr. Sidney Ace, reviewed patient's ED course and they accept admission.  Patient agrees with admission plan, offers no new complaints and is stable/unchanged at time of admit.        Final Clinical Impression(s) / ED Diagnoses Final diagnoses:  None    Rx / DC Orders ED Discharge Orders     None         Lorelle Gibbs, DO 05/01/21 0031

## 2021-04-30 NOTE — ED Provider Triage Note (Signed)
Emergency Medicine Provider Triage Evaluation Note ? ?[name] , a 61 y.o. female  was evaluated in triage.  Pt complains of shortness of breath and N/V since Saturday.  Endorses chronic lower abdominal pain, but now includes upper abdominal pain.  Intermittent fever, took some Tylenol yesterday which helped.  Unable to keep down food and fluids decreased urinary output.  Passing bowel movements.  Hx of COPD, CAD, HLD, HTN.  Hx alcohol and tobacco use. ? ?Review of Systems  ?Positive: As above ?Negative: As above ? ?Physical Exam  ?BP 90/64 (BP Location: Right Arm)   Pulse 67   Temp 99 ?F (37.2 ?C) (Oral)   Resp 17   SpO2 93%  ?Gen:   Awake, no distress   ?Resp:  Normal effort, CTAB ?MSK:   Moves extremities without difficulty  ?Other:  Diffuse abdominal pain.  Heart rate regular rate and rhythm without M/R/G ? ?Medical Decision Making  ?Medically screening exam initiated at 2:41 PM.  Appropriate orders placed.  Alysabeth E Ebbert was informed that the remainder of the evaluation will be completed by another provider, this initial triage assessment does not replace that evaluation, and the importance of remaining in the ED until their evaluation is complete. ? ?Labs, EKG, imaging ordered ? ?Zofran ODT ?  ?Cecil Cobbs, PA-C ?04/30/21 1455 ? ?

## 2021-04-30 NOTE — ED Notes (Signed)
;  first attempt unsuccessful, pt has label specimen cup  ?

## 2021-04-30 NOTE — Patient Instructions (Signed)

## 2021-05-01 ENCOUNTER — Encounter (HOSPITAL_COMMUNITY): Payer: Self-pay | Admitting: Family Medicine

## 2021-05-01 DIAGNOSIS — F419 Anxiety disorder, unspecified: Secondary | ICD-10-CM

## 2021-05-01 DIAGNOSIS — N189 Chronic kidney disease, unspecified: Secondary | ICD-10-CM | POA: Insufficient documentation

## 2021-05-01 DIAGNOSIS — E1143 Type 2 diabetes mellitus with diabetic autonomic (poly)neuropathy: Secondary | ICD-10-CM

## 2021-05-01 DIAGNOSIS — K529 Noninfective gastroenteritis and colitis, unspecified: Secondary | ICD-10-CM | POA: Diagnosis not present

## 2021-05-01 DIAGNOSIS — N179 Acute kidney failure, unspecified: Secondary | ICD-10-CM | POA: Insufficient documentation

## 2021-05-01 DIAGNOSIS — F32A Depression, unspecified: Secondary | ICD-10-CM

## 2021-05-01 DIAGNOSIS — E785 Hyperlipidemia, unspecified: Secondary | ICD-10-CM

## 2021-05-01 HISTORY — DX: Acute kidney failure, unspecified: N18.9

## 2021-05-01 LAB — BASIC METABOLIC PANEL
Anion gap: 11 (ref 5–15)
BUN: 23 mg/dL (ref 8–23)
CO2: 23 mmol/L (ref 22–32)
Calcium: 7.9 mg/dL — ABNORMAL LOW (ref 8.9–10.3)
Chloride: 101 mmol/L (ref 98–111)
Creatinine, Ser: 1.68 mg/dL — ABNORMAL HIGH (ref 0.44–1.00)
GFR, Estimated: 34 mL/min — ABNORMAL LOW (ref >60)
Glucose, Bld: 206 mg/dL — ABNORMAL HIGH (ref 70–99)
Potassium: 3.3 mmol/L — ABNORMAL LOW (ref 3.5–5.1)
Sodium: 135 mmol/L (ref 135–145)

## 2021-05-01 LAB — CBC
HCT: 37.6 % (ref 36.0–46.0)
Hemoglobin: 12.3 g/dL (ref 12.0–15.0)
MCH: 30.3 pg (ref 26.0–34.0)
MCHC: 32.7 g/dL (ref 30.0–36.0)
MCV: 92.6 fL (ref 80.0–100.0)
Platelets: 192 10*3/uL (ref 150–400)
RBC: 4.06 MIL/uL (ref 3.87–5.11)
RDW: 14.2 % (ref 11.5–15.5)
WBC: 5.1 10*3/uL (ref 4.0–10.5)
nRBC: 0 % (ref 0.0–0.2)

## 2021-05-01 LAB — HIV ANTIBODY (ROUTINE TESTING W REFLEX): HIV Screen 4th Generation wRfx: NONREACTIVE

## 2021-05-01 LAB — C DIFFICILE QUICK SCREEN W PCR REFLEX
C Diff antigen: NEGATIVE
C Diff interpretation: NOT DETECTED
C Diff toxin: NEGATIVE

## 2021-05-01 LAB — GLUCOSE, CAPILLARY
Glucose-Capillary: 229 mg/dL — ABNORMAL HIGH (ref 70–99)
Glucose-Capillary: 138 mg/dL — ABNORMAL HIGH (ref 70–99)
Glucose-Capillary: 190 mg/dL — ABNORMAL HIGH (ref 70–99)
Glucose-Capillary: 117 mg/dL — ABNORMAL HIGH (ref 70–99)

## 2021-05-01 LAB — HEMOGLOBIN A1C
Hgb A1c MFr Bld: 7 % — ABNORMAL HIGH (ref 4.8–5.6)
Mean Plasma Glucose: 154.2 mg/dL

## 2021-05-01 MED ORDER — FLUTICASONE FUROATE-VILANTEROL 200-25 MCG/ACT IN AEPB
1.0000 | INHALATION_SPRAY | Freq: Every day | RESPIRATORY_TRACT | Status: DC
  Administered 2021-05-01 – 2021-05-03 (×3): 1 via RESPIRATORY_TRACT
  Filled 2021-05-01: qty 28

## 2021-05-01 MED ORDER — LIDOCAINE 5 % EX PTCH: 1.0000 | MEDICATED_PATCH | Freq: Every day | CUTANEOUS | Status: DC | PRN

## 2021-05-01 MED ORDER — PANTOPRAZOLE SODIUM 40 MG PO TBEC
40.0000 mg | DELAYED_RELEASE_TABLET | Freq: Every day | ORAL | Status: DC
  Administered 2021-05-01 – 2021-05-03 (×3): 40 mg via ORAL
  Filled 2021-05-01 (×3): qty 1

## 2021-05-01 MED ORDER — SODIUM CHLORIDE 0.9 % IV BOLUS
250.0000 mL | Freq: Once | INTRAVENOUS | Status: AC
  Administered 2021-05-01: 250 mL via INTRAVENOUS

## 2021-05-01 MED ORDER — ASPIRIN 81 MG PO CHEW
81.0000 mg | CHEWABLE_TABLET | Freq: Every day | ORAL | Status: DC
Start: 2021-05-01 — End: 2021-05-01

## 2021-05-01 MED ORDER — EPINEPHRINE 0.3 MG/0.3ML IJ SOAJ: 0.3000 mg | INTRAMUSCULAR | Status: DC | PRN

## 2021-05-01 MED ORDER — LOPERAMIDE HCL 2 MG PO CAPS
2.0000 mg | ORAL_CAPSULE | Freq: Four times a day (QID) | ORAL | Status: DC | PRN
  Administered 2021-05-01: 2 mg via ORAL
  Filled 2021-05-01: qty 1

## 2021-05-01 MED ORDER — MAGNESIUM OXIDE -MG SUPPLEMENT 400 (240 MG) MG PO TABS: 200.0000 mg | ORAL_TABLET | Freq: Every evening | ORAL | Status: DC | PRN

## 2021-05-01 MED ORDER — MELATONIN 5 MG PO TABS
5.0000 mg | ORAL_TABLET | Freq: Every day | ORAL | Status: DC
  Administered 2021-05-01 – 2021-05-02 (×3): 5 mg via ORAL
  Filled 2021-05-01 (×4): qty 1

## 2021-05-01 MED ORDER — GABAPENTIN 300 MG PO CAPS
300.0000 mg | ORAL_CAPSULE | Freq: Three times a day (TID) | ORAL | Status: DC
  Administered 2021-05-01 – 2021-05-03 (×7): 300 mg via ORAL
  Filled 2021-05-01 (×7): qty 1

## 2021-05-01 MED ORDER — ONDANSETRON HCL 4 MG/2ML IJ SOLN
4.0000 mg | Freq: Four times a day (QID) | INTRAMUSCULAR | Status: DC | PRN
Start: 2021-05-01 — End: 2021-05-03
  Administered 2021-05-01 – 2021-05-02 (×2): 4 mg via INTRAVENOUS
  Filled 2021-05-01 (×2): qty 2

## 2021-05-01 MED ORDER — CYCLOBENZAPRINE HCL 10 MG PO TABS: 10.0000 mg | ORAL_TABLET | Freq: Three times a day (TID) | ORAL | Status: DC | PRN

## 2021-05-01 MED ORDER — ALUM & MAG HYDROXIDE-SIMETH 200-200-20 MG/5ML PO SUSP: 15.0000 mL | ORAL | Status: DC | PRN

## 2021-05-01 MED ORDER — ADULT MULTIVITAMIN W/MINERALS CH
1.0000 | ORAL_TABLET | Freq: Every day | ORAL | Status: DC
  Administered 2021-05-01 – 2021-05-03 (×3): 1 via ORAL
  Filled 2021-05-01 (×3): qty 1

## 2021-05-01 MED ORDER — ENOXAPARIN SODIUM 30 MG/0.3ML IJ SOSY
30.0000 mg | PREFILLED_SYRINGE | INTRAMUSCULAR | Status: DC
  Administered 2021-05-01: 30 mg via SUBCUTANEOUS
  Filled 2021-05-01: qty 0.3

## 2021-05-01 MED ORDER — BUSPIRONE HCL 5 MG PO TABS
10.0000 mg | ORAL_TABLET | Freq: Two times a day (BID) | ORAL | Status: DC
  Administered 2021-05-01 – 2021-05-03 (×6): 10 mg via ORAL
  Filled 2021-05-01 (×4): qty 2
  Filled 2021-05-01: qty 1
  Filled 2021-05-01: qty 2

## 2021-05-01 MED ORDER — NICOTINE 21 MG/24HR TD PT24
21.0000 mg | MEDICATED_PATCH | Freq: Every day | TRANSDERMAL | Status: DC
  Administered 2021-05-01 – 2021-05-03 (×3): 21 mg via TRANSDERMAL
  Filled 2021-05-01 (×3): qty 1

## 2021-05-01 MED ORDER — ONDANSETRON HCL 4 MG PO TABS: 4.0000 mg | ORAL_TABLET | Freq: Four times a day (QID) | ORAL | Status: DC | PRN

## 2021-05-01 MED ORDER — LINAGLIPTIN 5 MG PO TABS
5.0000 mg | ORAL_TABLET | Freq: Every day | ORAL | Status: DC
  Administered 2021-05-01 – 2021-05-03 (×3): 5 mg via ORAL
  Filled 2021-05-01 (×3): qty 1

## 2021-05-01 MED ORDER — DULOXETINE HCL 60 MG PO CPEP
60.0000 mg | ORAL_CAPSULE | Freq: Every day | ORAL | Status: DC
  Administered 2021-05-01 – 2021-05-03 (×3): 60 mg via ORAL
  Filled 2021-05-01 (×3): qty 1

## 2021-05-01 MED ORDER — ROSUVASTATIN CALCIUM 20 MG PO TABS
40.0000 mg | ORAL_TABLET | Freq: Every day | ORAL | Status: DC
  Administered 2021-05-01 – 2021-05-02 (×3): 40 mg via ORAL
  Filled 2021-05-01 (×2): qty 2

## 2021-05-01 MED ORDER — TRAZODONE HCL 50 MG PO TABS: 25.0000 mg | ORAL_TABLET | Freq: Every evening | ORAL | Status: DC | PRN

## 2021-05-01 MED ORDER — UMECLIDINIUM BROMIDE 62.5 MCG/ACT IN AEPB
1.0000 | INHALATION_SPRAY | Freq: Every day | RESPIRATORY_TRACT | Status: DC
Start: 2021-05-01 — End: 2021-05-03
  Administered 2021-05-01 – 2021-05-03 (×3): 1 via RESPIRATORY_TRACT
  Filled 2021-05-01: qty 7

## 2021-05-01 MED ORDER — POTASSIUM CHLORIDE IN NACL 20-0.9 MEQ/L-% IV SOLN
INTRAVENOUS | Status: DC
  Filled 2021-05-01 (×6): qty 1000

## 2021-05-01 MED ORDER — MAGNESIUM HYDROXIDE 400 MG/5ML PO SUSP: 30.0000 mL | Freq: Every day | ORAL | Status: DC | PRN

## 2021-05-01 MED ORDER — FAMOTIDINE 20 MG PO TABS
20.0000 mg | ORAL_TABLET | Freq: Every evening | ORAL | Status: DC
  Administered 2021-05-01 – 2021-05-02 (×2): 20 mg via ORAL
  Filled 2021-05-01 (×2): qty 1

## 2021-05-01 MED ORDER — INSULIN ASPART 100 UNIT/ML IJ SOLN
0.0000 [IU] | Freq: Three times a day (TID) | INTRAMUSCULAR | Status: DC
  Administered 2021-05-01: 3 [IU] via SUBCUTANEOUS
  Administered 2021-05-01: 2 [IU] via SUBCUTANEOUS
  Administered 2021-05-01 – 2021-05-02 (×2): 1 [IU] via SUBCUTANEOUS
  Administered 2021-05-02 – 2021-05-03 (×2): 2 [IU] via SUBCUTANEOUS

## 2021-05-01 MED ORDER — ALBUTEROL SULFATE (2.5 MG/3ML) 0.083% IN NEBU: 2.5000 mg | INHALATION_SOLUTION | Freq: Four times a day (QID) | RESPIRATORY_TRACT | Status: DC | PRN

## 2021-05-01 MED ORDER — IRBESARTAN 75 MG PO TABS: 37.5000 mg | ORAL_TABLET | Freq: Every day | ORAL | Status: DC

## 2021-05-01 NOTE — Assessment & Plan Note (Signed)
-   We will continue statin therapy. 

## 2021-05-01 NOTE — H&P (Addendum)
[place]   PATIENT NAME: [name]    MR#:  [id number]  DATE OF BIRTH:  [birth year]  DATE OF ADMISSION:  04/30/2021  PRIMARY CARE PHYSICIAN: [name], [name]   Patient is coming from: Home  REQUESTING/REFERRING PHYSICIAN: Horton, Alvin Critchley, DO  CHIEF COMPLAINT:  Intractable nausea and vomiting  HISTORY OF PRESENT ILLNESS:  RHINA KRAMME is a 61 y.o. female with medical history significant for type 2 diabetes mellitus with stage II CKD, hypertension, dyslipidemia, anxiety and depression, who presented to the ER with acute onset of recurrent nausea and vomiting over the last 3 days with associated loose bowel movements.  She admits to headache and dizziness as well as tactile fever and chills.  She has been having mild dyspnea and dry cough.  No chest pain or palpitations.  She denies any bilious vomitus or hematemesis.  No melena or bright red bleeding per rectum.  She denies any recent COVID-19 exposure.  She has not been vaccinated for COVID-19.  No dysuria, oliguria, hematuria, urinary frequency or urgency or flank pain.  ED Course: Physical in the ER she was hypotensive with a BP of 64/44 respiratory rate 23 with hydration BP was up to 106/69 and respiratory rate was down to 15.  Labs revealed mild hyponatremia hypochloremia and low glucose of 192 with a creatinine of 2.3 up from 1 in October 2020.  AST was slightly elevated at 85 and ALT 62 with serum lipase level of 73. EKG as reviewed by me : EKG showed sinus tachycardia with a rate of 115 with right atrial enlargement Imaging: Two-view chest x-ray showed no acute cardiopulmonary disease.  Abdominal pelvic CT scan revealed diverticulosis without diverticulitis, fatty infiltration of the liver and no acute abnormality noted.  The patient was given 40 g of IV Zofran and 1 L bolus of IV normal saline.  She will be admitted to an observation medical bed for further evaluation and management. PAST MEDICAL HISTORY:   Past  Medical History:  Diagnosis Date   Acute kidney failure with lesion of tubular necrosis ([place]) 11/11/2016   Anxiety    Back pain    Diabetes mellitus    Essential hypertension 11/11/2016   Fibromyalgia    High cholesterol    Hypertension    Syncope 11/11/2016    PAST SURGICAL HISTORY:   Past Surgical History:  Procedure Laterality Date   HAND SURGERY     HYSTEROSCOPY  11/07   D&C   OOPHORECTOMY     BSO   TUBAL LIGATION  1991   VAGINAL HYSTERECTOMY  01/2008   [facility] BSO    SOCIAL HISTORY:   Social History   Tobacco Use   Smoking status: Every Day    Packs/day: 1.00    Years: 45.00    Pack years: 45.00    Types: Cigarettes   Smokeless tobacco: Never   Tobacco comments:    1 pack/ day months  Substance Use Topics   Alcohol use: Yes    Alcohol/week: 0.0 standard drinks    Comment: rare    FAMILY HISTORY:   Family History  Problem Relation Age of Onset   Allergic rhinitis Mother    Hypertension Mother    Heart failure Mother    Allergic rhinitis Father    Diabetes Father    COPD Father    Emphysema Father    Heart failure Maternal Grandmother    Cancer Maternal Grandfather  LUNG    Heart disease Maternal Grandfather    Lung cancer Maternal Grandfather    Bronchitis Daughter        TWICE YEARLY   Allergic rhinitis Daughter    Bronchitis Son        YEARLY   Allergic rhinitis Son    Food Allergy Grandchild    Asthma Neg Hx    Immunodeficiency Neg Hx    Eczema Neg Hx    Atopy Neg Hx    Angioedema Neg Hx     DRUG ALLERGIES:   Allergies  Allergen Reactions   Tylenol [Acetaminophen] Itching   Pollen Extract    Shellfish Allergy Hives   Peanut-Containing Drug Products Hives    REVIEW OF SYSTEMS:   ROS As per history of present illness. All pertinent systems were reviewed above. Constitutional, HEENT, cardiovascular, respiratory, GI, GU, musculoskeletal, neuro, psychiatric, endocrine, integumentary and hematologic systems were reviewed and  are otherwise negative/unremarkable except for positive findings mentioned above in the HPI.   MEDICATIONS AT HOME:   Prior to Admission medications   Medication Sig Start Date End Date Taking? Authorizing Provider  acetaminophen (TYLENOL) 500 MG tablet Take 1,000 mg by mouth every 6 (six) hours as needed for moderate pain or headache.   Yes [provider]  albuterol (VENTOLIN HFA) 108 (90 Base) MCG/ACT inhaler INHALE 1 PUFF BY MOUTH EVERY 6 HOURS AS NEEDED FOR WHEEZING OR SHORTNESS OF BREATH Patient taking differently: Inhale 1 puff into the lungs every 6 (six) hours as needed for wheezing or shortness of breath. 02/21/21  Yes [name], [name]  alum & mag hydroxide-simeth (MAALOX/MYLANTA) 200-200-20 MG/5ML suspension Take 15 mLs by mouth every 4 (four) hours as needed for indigestion or heartburn. 07/23/17  Yes Patrecia Pour, Christean Grief, MD  aspirin 81 MG chewable tablet Chew 81 mg by mouth daily.   Yes [provider]  busPIRone (BUSPAR) 10 MG tablet TAKE ONE TABLET BY MOUTH EVERY DAY Patient taking differently: Take 10 mg by mouth 2 (two) times daily. 04/11/21  Yes [name], [name]  cyclobenzaprine (FLEXERIL) 10 MG tablet Take 1 tablet (10 mg total) by mouth 3 (three) times daily as needed for muscle spasms. 02/08/21  Yes [name], [name]  DULoxetine (CYMBALTA) 60 MG capsule Take 1 capsule (60 mg total) by mouth daily. 12/04/20  Yes [name], [name]  EPINEPHrine 0.3 mg/0.3 mL IJ SOAJ injection Inject 0.3 mg into the muscle as needed for anaphylaxis. 08/23/20  Yes [name], [name]  famotidine (PEPCID) 20 MG tablet One after supper Patient taking differently: Take 20 mg by mouth every evening. 12/04/20  Yes [name], [name]  Fluticasone-Umeclidin-Vilant (TRELEGY ELLIPTA) 200-62.5-25 MCG/INH AEPB Inhale 1 puff into the lungs daily. Rinse mouth after each use. 11/30/20  Yes Garnet Sierras, DO  gabapentin (NEURONTIN) 300 MG capsule Take 1 capsule (300 mg total) by mouth 3 (three)  times daily. 12/04/20  Yes [name], [name]  lidocaine (LIDODERM) 5 % Place 1 patch onto the skin daily. Remove & Discard patch within 12 hours or as directed by MD Patient taking differently: Place 1 patch onto the skin daily as needed (back pain). Remove & Discard patch within 12 hours or as directed by MD 02/21/21  Yes [name], [name]  Magnesium 250 MG TABS Take 1 tablet (250 mg total) by mouth daily. Take with evening meal Patient taking differently: Take 250 mg by mouth at bedtime as needed (cramping). 05/03/20  Yes [name], [name]  Melatonin 5  MG TABS Take 5 mg by mouth at bedtime.   Yes [provider]  metFORMIN (GLUCOPHAGE-XR) 750 MG 24 hr tablet TAKE ONE TABLET BY MOUTH EVERY DAY WITH BREAKFAST Patient taking differently: Take 750 mg by mouth daily with breakfast. 02/20/21  Yes [name], [name]  Multiple Vitamins-Minerals (ONE-A-DAY WOMENS PO) Take 1 tablet by mouth daily.   Yes [provider]  olmesartan (BENICAR) 5 MG tablet TAKE ONE TABLET BY MOUTH EVERY DAY Patient taking differently: Take 5 mg by mouth daily. 04/30/21  Yes [name], [name]  pantoprazole (PROTONIX) 40 MG tablet TAKE 1 TABLET BY MOUTH ONCE DAILY(TAKE 30-60 MINUTES BEFORE FIRST MEAL OF THE DAY) Patient taking differently: Take 40 mg by mouth daily. 12/04/20  Yes [name], [name]  rosuvastatin (CRESTOR) 40 MG tablet Take 1 tablet (40 mg total) by mouth daily. Patient taking differently: Take 40 mg by mouth at bedtime. 04/30/21  Yes [name], [name]  sitaGLIPtin (JANUVIA) 100 MG tablet Take 1 tablet (100 mg total) by mouth daily. 12/05/20  Yes [name], [name]  Tetrahydrozoline HCl (VISINE OP) Place 1 drop into both eyes daily as needed (dry/itchy eyes).   Yes [provider]  blood glucose meter kit and supplies KIT Dispense based on patient and insurance preference. Use up to four times daily as directed. (FOR ICD-9 250.00, 250.01). 07/29/18   [name], [name]  HYDROcodone  bit-homatropine (HYDROMET) 5-1.5 MG/5ML syrup Take 5 mLs by mouth every 6 (six) hours as needed for cough. Patient not taking: Reported on 04/30/2021 08/23/20   [name], [name]  meloxicam (MOBIC) 7.5 MG tablet Take 7.5 mg by mouth 2 (two) times daily as needed. Patient not taking: Reported on 04/30/2021 04/16/21   [provider]  nystatin powder Apply 1 application. topically 3 (three) times daily. Patient not taking: Reported on 04/30/2021 04/30/21   [name], [name]      VITAL SIGNS:  Blood pressure 95/62, pulse 87, temperature 98.1 F (36.7 C), temperature source Oral, resp. rate 16, height 5' (1.524 m), weight 58.9 kg, SpO2 96 %.  PHYSICAL EXAMINATION:  Physical Exam  GENERAL:  61 y.o.-year-old African-American female patient lying in the bed with no acute distress.  EYES: Pupils equal, round, reactive to light and accommodation. No scleral icterus. Extraocular muscles intact.  HEENT: Head atraumatic, normocephalic. Oropharynx and nasopharynx clear.  NECK:  Supple, no jugular venous distention. No thyroid enlargement, no tenderness.  LUNGS: Normal breath sounds bilaterally, no wheezing, rales,rhonchi or crepitation. No use of accessory muscles of respiration.  CARDIOVASCULAR: Regular rate and rhythm, S1, S2 normal. No murmurs, rubs, or gallops.  ABDOMEN: Soft, nondistended, with mild Tenderness without Rebound Tenderness Guarding or Rigidity.  Bowel sounds present. No organomegaly or mass.  EXTREMITIES: No pedal edema, cyanosis, or clubbing.  NEUROLOGIC: Cranial nerves II through XII are intact. Muscle strength 5/5 in all extremities. Sensation intact. Gait not checked.  PSYCHIATRIC: The patient is alert and oriented x 3.  Normal affect and good eye contact. SKIN: No obvious rash, lesion, or ulcer.   LABORATORY PANEL:   CBC Recent Labs  Lab 04/30/21 1419  WBC 8.2  HGB 14.1  HCT 44.0  PLT 263    ------------------------------------------------------------------------------------------------------------------  Chemistries  Recent Labs  Lab 04/30/21 1419  NA 132*  K 3.6  CL 94*  CO2 24  GLUCOSE 192*  BUN 20  CREATININE 2.30*  CALCIUM 8.9  AST 85*  ALT 62*  ALKPHOS 58  BILITOT 0.9   ------------------------------------------------------------------------------------------------------------------  Cardiac Enzymes No results for input(s): TROPONINI in the last 168 hours. ------------------------------------------------------------------------------------------------------------------  RADIOLOGY:  CT Abdomen Pelvis Wo Contrast  Result Date: 04/30/2021 CLINICAL DATA:  Acute abdominal pain with nausea and vomiting, initial encounter EXAM: CT ABDOMEN AND PELVIS WITHOUT CONTRAST TECHNIQUE: Multidetector CT imaging of the abdomen and pelvis was performed following the standard protocol without IV contrast. RADIATION DOSE REDUCTION: This exam was performed according to the departmental dose-optimization program which includes automated exposure control, adjustment of the mA and/or kV according to patient size and/or use of iterative reconstruction technique. COMPARISON:  08/21/2017 FINDINGS: Lower chest: No acute abnormality. Hepatobiliary: Fatty infiltration of the liver is noted. Gallbladder is decompressed. Pancreas: Unremarkable. No pancreatic ductal dilatation or surrounding inflammatory changes. Spleen: Normal in size without focal abnormality. Adrenals/Urinary Tract: Adrenal glands are within normal limits. Kidneys are well visualized bilaterally. No definitive renal calculi are seen. No obstructive changes are noted. Ureters are within normal limits. The bladder is partially distended. Stomach/Bowel: Scattered diverticular changes noted without evidence of diverticulitis. No obstructive changes are seen. The appendix is within normal limits. No obstructive changes in the small  bowel are seen. The stomach is within normal limits. Vascular/Lymphatic: Aortic atherosclerosis. No enlarged abdominal or pelvic lymph nodes. Reproductive: Status post hysterectomy. No adnexal masses. Other: No abdominal wall hernia or abnormality. No abdominopelvic ascites. Musculoskeletal: Degenerative changes of lumbar spine are noted with mild anterolisthesis of L4 on L5. IMPRESSION: Diverticulosis without diverticulitis. Fatty infiltration of the liver. No acute abnormality noted. Electronically Signed   By: Inez Catalina M.D.   On: 04/30/2021 21:12   DG Chest 2 View  Result Date: 04/30/2021 CLINICAL DATA:  Shortness of breath. EXAM: CHEST - 2 VIEW COMPARISON:  Chest x-ray 10/27/2018. FINDINGS: The heart size and mediastinal contours are within normal limits. Both lungs are clear. The visualized skeletal structures are unremarkable. IMPRESSION: No active cardiopulmonary disease. Electronically Signed   By: Ronney Asters M.D.   On: 04/30/2021 15:19      IMPRESSION AND PLAN:  Assessment and Plan: AKI (acute kidney injury) ([place]) - The patient will be admitted to an observation medical bed. - This is likely prerenal due to volume depletion and dehydration from intractable nausea and vomiting and could be related to ATN from hypotension. - She will be hydrated with IV normal saline. - We will follow BMP. - We will avoid nephrotoxins.   Acute gastroenteritis - The patient will be hydrated with IV normal saline and will follow BMP. - We will hold aspirin. - We will hold off Mobic. - We will continue PPI therapy.  Type II diabetes mellitus with peripheral autonomic neuropathy (HCC) - The patient will be placed on supplement coverage with NovoLog. - We will continue Januvia and hold off metformin especially given AKI. - We will continue Neurontin.  Dyslipidemia We will continue statin therapy.  Anxiety and depression - We will continue Cymbalta and BuSpar.  Acute kidney injury  superimposed on chronic kidney disease (HCC)    COPD 0 stage with emphysema  on CT/ actively smoking  - We will continue her inhalers. - I counseled her for smoking cessation and will receive further counseling here.       DVT prophylaxis: Lovenox. Advanced Care Planning:  Code Status: full code. Family Communication:  The plan of care was discussed in details with the patient (and family). I answered all questions. The patient agreed to proceed with the above mentioned plan. Further management will depend upon hospital course. Disposition Plan:  Back to previous home environment Consults called: none. All the records are reviewed and case discussed with ED provider.  Status is: Observation  I certify that at the time of admission, it is my clinical judgment that the patient will require hospital care extending more than 2 midnights.                            Dispo: The patient is from: Home              Anticipated d/c is to: Home              Patient currently is not medically stable to d/c.              Difficult to place patient: No  Christel Mormon M.D on 05/01/2021 at 2:35 AM  [facility]   From 7 PM-7 AM, contact night-coverage [email or web address]  CC: Primary care physician; [name], [name]

## 2021-05-01 NOTE — Assessment & Plan Note (Addendum)
-   The patient will be placed on supplement coverage with NovoLog. ?- We will continue Januvia and hold off metformin especially given AKI. ?- We will continue Neurontin. ?

## 2021-05-01 NOTE — Progress Notes (Signed)
?PROGRESS NOTE ? ?[name]  ?DOB: [birth year]  ?PCP: [name], [name] ?XBW:[id number]  ?DOA: 04/30/2021 ? LOS: 0 days  ?Hospital Day: 2 ? ?Brief narrative: ?[name] is a 61 y.o. female with PMH significant for DM2, HTN, HLD, CKD, back pain, fibromyalgia, anxiety, depression. ?Patient presented to the ED on 04/30/2021 with progressive nausea, vomiting, diarrhea for last 3 days associated with headache, dizziness, subjective fever. ?In the ED, patient was afebrile, hypotensive at 64/44, blood pressure improved to 106/69 with IV fluid. ?Labs with sodium low at 132, creatinine elevated 2.3, AST/ALT elevated 85/62, normal alk phos, CBC unremarkable. ?CT abdomen showed diverticulosis without diverticulitis, fatty liver. ?Patient was given IV fluid, IV Zofran ?Kept in observation to hospitalist service. ? ?Subjective: ?Patient was seen and examined his morning.  Pleasant middle-aged African-American female.  Not in physical distress.  She states her vomiting has stopped but diarrhea continues. ? ?Active Problems: ?  AKI (acute kidney injury) ([place]) ?  Acute gastroenteritis ?  Type II diabetes mellitus with peripheral autonomic neuropathy (HCC) ?  Dyslipidemia ?  COPD 0 stage with emphysema  on CT/ actively smoking  ?  Anxiety and depression ?  ? ?Assessment and Plan: ?Acute gastroenteritis ?-Unclear trilogy of acute gastroenteritis.  Probably viral.   ?-Vomiting improved.  Diarrhea continues.  C. difficile assay negative.  GI pathogen panel pending.   ?-Currently being monitored off antibiotics.  Continue IV fluid.  Continue PPI.   ? ?AKI ?-Likely due to volume depletion from gastroenteritis ?-Avoid nephrotoxins.  Stop Mobic.  Hold aspirin ?-Creatinine improving.  Continue to monitor ?Recent Labs  ?  05/03/20 ?1111 11/29/20 ?1043 04/30/21 ?1419 05/01/21 ?1638  ?BUN _0 ?CREATININE 0.91 1.03* 2.30* 1.68*  ? ?Hypokalemia ?-Potassium level low at 3.3.  Potassium on IV fluid.  Continue to monitor ?Recent  Labs  ?Lab 04/30/21 ?1419 05/01/21 ?4536  ?K 3.6 3.3*  ? ?Type 2 diabetes mellitus with hyperglycemia ?Peripheral neuropathy ?-A1c 7 on 3/8 ?-Home meds include Januvia, metformin.  Keep it on hold ?-Blood sugar level running elevated probably because of holding medicines ?-Currently on sliding scale insulin with Accu-Cheks ?-Resume Januvia.  Metformin on hold ?-Continue Neurontin ?Recent Labs  ?Lab 05/01/21 ?4680 05/01/21 ?1132 05/01/21 ?1622  ?GLUCAP 229* 138* 190*  ? ?Dyslipidemia ?-Continue statin ? ?Anxiety and depression ?-continue Cymbalta and BuSpar. ? ?COPD  ?-Continue her inhalers. ?-I counseled her for smoking cessation and will receive further counseling here. ? ?Goals of care ?  Code Status: Full Code  ? ? ?Mobility: Encourage ambulation ? ?Nutritional status:  ?Body mass index is 25.36 kg/m?.  ?  ?  ? ? ? ? ?Diet:  ?Diet Order   ? ?       ?  Diet heart healthy/carb modified Room service appropriate? Yes; Fluid consistency: Thin  Diet effective now       ?  ? ?  ?  ? ?  ? ? ?DVT prophylaxis:  ?enoxaparin (LOVENOX) injection 30 mg Start: 05/01/21 1200 ?  ?Antimicrobials: None ?Fluid: NS with potassium at 100 mill per hour ?Consultants: None ?Family Communication: None at bedside ? ?Status is: Observation ? ?Continue in-hospital care because: Needs IV hydration ?Level of care: Med-Surg  ? ?Dispo: The patient is from: Home ?             Anticipated d/c is to: Home ?             Patient currently is not medically stable  to d/c. ?  Difficult to place patient No ? ? ? ? ?Infusions:  ? 0.9 % NaCl with KCl 20 mEq / L 100 mL/hr at 05/01/21 1153  ? ? ?Scheduled Meds: ? busPIRone  10 mg Oral BID  ? DULoxetine  60 mg Oral Daily  ? enoxaparin (LOVENOX) injection  30 mg Subcutaneous Q24H  ? famotidine  20 mg Oral QPM  ? fluticasone furoate-vilanterol  1 puff Inhalation Daily  ? And  ? umeclidinium bromide  1 puff Inhalation Daily  ? gabapentin  300 mg Oral TID  ? insulin aspart  0-9 Units Subcutaneous TID AC & HS  ?  linagliptin  5 mg Oral Daily  ? melatonin  5 mg Oral QHS  ? multivitamin with minerals  1 tablet Oral Daily  ? nicotine  21 mg Transdermal Daily  ? pantoprazole  40 mg Oral Daily  ? rosuvastatin  40 mg Oral QHS  ? ? ?PRN meds: ?albuterol, alum & mag hydroxide-simeth, cyclobenzaprine, lidocaine, loperamide, magnesium hydroxide, magnesium oxide, ondansetron **OR** ondansetron (ZOFRAN) IV, traZODone  ? ?Antimicrobials: ?Anti-infectives (From admission, onward)  ? ? None  ? ?  ? ? ?Objective: ?Vitals:  ? 05/01/21 0731 05/01/21 1556  ?BP: 97/70 118/78  ?Pulse: 91 85  ?Resp: 16 16  ?Temp: 98.5 ?F (36.9 ?C) (!) 97.5 ?F (36.4 ?C)  ?SpO2: 96% 96%  ? ? ?Intake/Output Summary (Last 24 hours) at 05/01/2021 1631 ?Last data filed at 05/01/2021 0445 ?Gross per 24 hour  ?Intake 736.88 ml  ?Output --  ?Net 736.88 ml  ? ?Filed Weights  ? 05/01/21 0158  ?Weight: 58.9 kg  ? ?Weight change:  ?Body mass index is 25.36 kg/m?.  ? ?Physical Exam: ?General exam: Pleasant, middle-aged African-American female.  Not in distress ?Skin: No rashes, lesions or ulcers. ?HEENT: Atraumatic, normocephalic, no obvious bleeding ?Lungs: Clear to auscultation bilaterally ?CVS: Regular rate and rhythm, no murmur ?GI/Abd soft, nontender, nondistended, bowel sound present ?CNS: Alert, awake, oriented x3 ?Psychiatry: Mood appropriate ?Extremities: No pedal edema, no calf tenderness ? ?Data Review: I have personally reviewed the laboratory data and studies available. ? ?F/u labs ordered ?Unresulted Labs (From admission, onward)  ? ?  Start     Ordered  ? 05/02/21 0500  CBC with Differential/Platelet  Daily,   R     ? 05/01/21 1631  ? 05/02/21 9597  Basic metabolic panel  Daily,   R     ? 05/01/21 1631  ? 05/01/21 1023  Gastrointestinal Panel by PCR , Stool  (Gastrointestinal Panel by PCR, Stool                                                                                                                                                     **Does Not include  CLOSTRIDIUM DIFFICILE testing. **If CDIFF testing is needed, place  order from the "C Difficile Testing" order set.**)  Once,   R       ? 05/01/21 1022  ? ?  ?  ? ?  ? ? ?Signed, ?Terrilee Croak, MD ?[facility] ?05/01/2021 ? ? ? ? ? ? ? ? ? ? ?  ?

## 2021-05-01 NOTE — Assessment & Plan Note (Addendum)
-   We will continue her inhalers. ?- I counseled her for smoking cessation and will receive further counseling here. ?

## 2021-05-01 NOTE — Assessment & Plan Note (Signed)
-   The patient will be admitted to an observation medical bed. ?- This is likely prerenal due to volume depletion and dehydration from intractable nausea and vomiting and could be related to ATN from hypotension. ?- She will be hydrated with IV normal saline. ?- We will follow BMP. ?- We will avoid nephrotoxins. ? ?

## 2021-05-01 NOTE — Assessment & Plan Note (Signed)
-   We will continue Cymbalta and BuSpar. ?

## 2021-05-01 NOTE — Progress Notes (Signed)
Mobility Specialist Progress Note: ? ? 05/01/21 1448  ?Mobility  ?Activity Ambulated with assistance in hallway  ?Level of Assistance Standby assist, set-up cues, supervision of patient - no hands on  ?Assistive Device  ?(IV pole)  ?Distance Ambulated (ft) 570 ft  ?Activity Response Tolerated well  ?$Mobility charge 1 Mobility  ? ?Pt received in bed willing to participate in mobility. Recently pt has been feeling nauseous but stated she isn't nauseous right now. Left in bed with call bell in reach and all needs met.  ? ?[name] ?Mobility Specialist ?Primary Phone [phone] ? ?

## 2021-05-01 NOTE — Assessment & Plan Note (Addendum)
-   The patient will be hydrated with IV normal saline and will follow BMP. ?- We will hold aspirin. ?- We will hold off Mobic. ?- We will continue PPI therapy. ?

## 2021-05-02 DIAGNOSIS — J439 Emphysema, unspecified: Secondary | ICD-10-CM | POA: Diagnosis present

## 2021-05-02 DIAGNOSIS — I959 Hypotension, unspecified: Secondary | ICD-10-CM | POA: Diagnosis present

## 2021-05-02 DIAGNOSIS — E7800 Pure hypercholesterolemia, unspecified: Secondary | ICD-10-CM | POA: Diagnosis present

## 2021-05-02 DIAGNOSIS — E1143 Type 2 diabetes mellitus with diabetic autonomic (poly)neuropathy: Secondary | ICD-10-CM | POA: Diagnosis present

## 2021-05-02 DIAGNOSIS — E1122 Type 2 diabetes mellitus with diabetic chronic kidney disease: Secondary | ICD-10-CM | POA: Diagnosis present

## 2021-05-02 DIAGNOSIS — E878 Other disorders of electrolyte and fluid balance, not elsewhere classified: Secondary | ICD-10-CM | POA: Diagnosis present

## 2021-05-02 DIAGNOSIS — F419 Anxiety disorder, unspecified: Secondary | ICD-10-CM | POA: Diagnosis present

## 2021-05-02 DIAGNOSIS — I1310 Hypertensive heart and chronic kidney disease without heart failure, with stage 1 through stage 4 chronic kidney disease, or unspecified chronic kidney disease: Secondary | ICD-10-CM | POA: Diagnosis present

## 2021-05-02 DIAGNOSIS — F32A Depression, unspecified: Secondary | ICD-10-CM | POA: Diagnosis present

## 2021-05-02 DIAGNOSIS — N182 Chronic kidney disease, stage 2 (mild): Secondary | ICD-10-CM | POA: Diagnosis present

## 2021-05-02 DIAGNOSIS — A084 Viral intestinal infection, unspecified: Secondary | ICD-10-CM | POA: Diagnosis present

## 2021-05-02 DIAGNOSIS — N179 Acute kidney failure, unspecified: Secondary | ICD-10-CM | POA: Diagnosis present

## 2021-05-02 DIAGNOSIS — M797 Fibromyalgia: Secondary | ICD-10-CM | POA: Diagnosis present

## 2021-05-02 DIAGNOSIS — E871 Hypo-osmolality and hyponatremia: Secondary | ICD-10-CM | POA: Diagnosis present

## 2021-05-02 DIAGNOSIS — F17210 Nicotine dependence, cigarettes, uncomplicated: Secondary | ICD-10-CM | POA: Diagnosis present

## 2021-05-02 DIAGNOSIS — E860 Dehydration: Secondary | ICD-10-CM | POA: Diagnosis present

## 2021-05-02 DIAGNOSIS — Z28310 Unvaccinated for covid-19: Secondary | ICD-10-CM | POA: Diagnosis not present

## 2021-05-02 DIAGNOSIS — K5790 Diverticulosis of intestine, part unspecified, without perforation or abscess without bleeding: Secondary | ICD-10-CM | POA: Diagnosis present

## 2021-05-02 DIAGNOSIS — E1165 Type 2 diabetes mellitus with hyperglycemia: Secondary | ICD-10-CM | POA: Diagnosis present

## 2021-05-02 DIAGNOSIS — Z79899 Other long term (current) drug therapy: Secondary | ICD-10-CM | POA: Diagnosis not present

## 2021-05-02 DIAGNOSIS — Z7982 Long term (current) use of aspirin: Secondary | ICD-10-CM | POA: Diagnosis not present

## 2021-05-02 DIAGNOSIS — Z20822 Contact with and (suspected) exposure to covid-19: Secondary | ICD-10-CM | POA: Diagnosis present

## 2021-05-02 DIAGNOSIS — E876 Hypokalemia: Secondary | ICD-10-CM | POA: Diagnosis not present

## 2021-05-02 DIAGNOSIS — Z9851 Tubal ligation status: Secondary | ICD-10-CM | POA: Diagnosis not present

## 2021-05-02 LAB — CBC WITH DIFFERENTIAL/PLATELET
Abs Immature Granulocytes: 0.02 10*3/uL (ref 0.00–0.07)
Basophils Absolute: 0 10*3/uL (ref 0.0–0.1)
Basophils Relative: 1 %
Eosinophils Absolute: 0.1 10*3/uL (ref 0.0–0.5)
Eosinophils Relative: 4 %
HCT: 36.7 % (ref 36.0–46.0)
Hemoglobin: 11.6 g/dL — ABNORMAL LOW (ref 12.0–15.0)
Immature Granulocytes: 1 %
Lymphocytes Relative: 30 %
Lymphs Abs: 1 10*3/uL (ref 0.7–4.0)
MCH: 29.8 pg (ref 26.0–34.0)
MCHC: 31.6 g/dL (ref 30.0–36.0)
MCV: 94.3 fL (ref 80.0–100.0)
Monocytes Absolute: 0.2 10*3/uL (ref 0.1–1.0)
Monocytes Relative: 7 %
Neutro Abs: 1.8 10*3/uL (ref 1.7–7.7)
Neutrophils Relative %: 57 %
Platelets: 153 10*3/uL (ref 150–400)
RBC: 3.89 MIL/uL (ref 3.87–5.11)
RDW: 13.8 % (ref 11.5–15.5)
WBC: 3.1 10*3/uL — ABNORMAL LOW (ref 4.0–10.5)
nRBC: 0 % (ref 0.0–0.2)

## 2021-05-02 LAB — GASTROINTESTINAL PANEL BY PCR, STOOL (REPLACES STOOL CULTURE)

## 2021-05-02 LAB — BASIC METABOLIC PANEL
Anion gap: 7 (ref 5–15)
BUN: 17 mg/dL (ref 8–23)
CO2: 23 mmol/L (ref 22–32)
Calcium: 8.2 mg/dL — ABNORMAL LOW (ref 8.9–10.3)
Chloride: 107 mmol/L (ref 98–111)
Creatinine, Ser: 1.19 mg/dL — ABNORMAL HIGH (ref 0.44–1.00)
GFR, Estimated: 52 mL/min — ABNORMAL LOW (ref >60)
Glucose, Bld: 123 mg/dL — ABNORMAL HIGH (ref 70–99)
Potassium: 4.4 mmol/L (ref 3.5–5.1)
Sodium: 137 mmol/L (ref 135–145)

## 2021-05-02 LAB — GLUCOSE, CAPILLARY
Glucose-Capillary: 137 mg/dL — ABNORMAL HIGH (ref 70–99)
Glucose-Capillary: 103 mg/dL — ABNORMAL HIGH (ref 70–99)
Glucose-Capillary: 118 mg/dL — ABNORMAL HIGH (ref 70–99)
Glucose-Capillary: 176 mg/dL — ABNORMAL HIGH (ref 70–99)

## 2021-05-02 MED ORDER — ACETAMINOPHEN 325 MG PO TABS
650.0000 mg | ORAL_TABLET | Freq: Four times a day (QID) | ORAL | Status: DC | PRN
  Administered 2021-05-02: 21:00:00 650 mg via ORAL
  Filled 2021-05-02: qty 2

## 2021-05-02 MED ORDER — ENOXAPARIN SODIUM 40 MG/0.4ML IJ SOSY
40.0000 mg | PREFILLED_SYRINGE | Freq: Every day | INTRAMUSCULAR | Status: DC
  Administered 2021-05-02 – 2021-05-03 (×2): 40 mg via SUBCUTANEOUS
  Filled 2021-05-02 (×2): qty 0.4

## 2021-05-02 NOTE — Progress Notes (Signed)
Mobility Specialist Progress Note: ? ? 05/02/21 1153  ?Mobility  ?Activity Ambulated with assistance in hallway  ?Level of Assistance Standby assist, set-up cues, supervision of patient - no hands on  ?Assistive Device None  ?Distance Ambulated (ft) 1120 ft  ?Activity Response Tolerated well  ?$Mobility charge 1 Mobility  ? ?Pt received in bed willing to participate in mobility. No complaints of pain. Halfway through ambulation pt stated she occasionally gets pain in her upper abdomen. Left in chair with call bell in reach and all needs met.  ? ?[name] ?Mobility Specialist ?Primary Phone [phone] ? ?

## 2021-05-02 NOTE — Progress Notes (Signed)
?PROGRESS NOTE ? ?[name]  ?DOB: June 03, 1960  ?PCP: [name], [name] ?WNU:[id number]  ?DOA: 04/30/2021 ? LOS: 0 days  ?Hospital Day: 3 ? ?Brief narrative: ?[name] is a 61 y.o. female with PMH significant for DM2, HTN, HLD, CKD, back pain, fibromyalgia, anxiety, depression. ?Patient presented to the ED on 04/30/2021 with progressive nausea, vomiting, diarrhea for last 3 days associated with headache, dizziness, subjective fever. ?In the ED, patient was afebrile, hypotensive at 64/44, blood pressure improved to 106/69 with IV fluid. ?Labs with sodium low at 132, creatinine elevated 2.3, AST/ALT elevated 85/62, normal alk phos, CBC unremarkable. ?CT abdomen showed diverticulosis without diverticulitis, fatty liver. ?Patient was given IV fluid, IV Zofran ?Kept in observation to hospitalist service. ? ?Subjective: ?Patient was seen and examined his morning.   ?Lying down in bed.  Feels better.  Still nauseous but no vomiting.  Diarrhea 1-2 episodes in last 24 hours on Imodium. ? ?Principal Problem: ?  AKI (acute kidney injury) ([place]) ?Active Problems: ?  Acute gastroenteritis ?  Type II diabetes mellitus with peripheral autonomic neuropathy (HCC) ?  Dyslipidemia ?  COPD 0 stage with emphysema  on CT/ actively smoking  ?  Anxiety and depression ?  ? ?Assessment and Plan: ?Acute gastroenteritis ?-Unclear trilogy of acute gastroenteritis.  Probably viral.   ?-Vomiting improved.  Diarrhea improving on Imodium.  C. difficile assay negative.  GI pathogen panel pending.   ?-Currently being monitored off antibiotics.  Continue IV fluid.  Continue PPI.   ? ?AKI ?-Likely due to volume depletion from gastroenteritis ?-Avoid nephrotoxins.  Stop Mobic.  Hold aspirin ?-Creatinine improving gradually.  Continue to monitor on IV fluid ?Recent Labs  ?  05/03/20 ?1111 11/29/20 ?1043 04/30/21 ?1419 05/01/21 ?0347 05/02/21 ?0344  ?BUN $Rem'12 12 20 23 17  'XLNe$ ?CREATININE 0.91 1.03* 2.30* 1.68* 1.19*  ? ?Hypokalemia ?-Potassium level  improved with replacement ?Recent Labs  ?Lab 04/30/21 ?1419 05/01/21 ?4259 05/02/21 ?0344  ?K 3.6 3.3* 4.4  ? ?Type 2 diabetes mellitus with hyperglycemia ?Peripheral neuropathy ?-A1c 7 on 3/8 ?-Home meds include Januvia, metformin.   ?-Currently continued on Januvia.  Metformin on hold because of AKI.  Continue sliding scale insulin with Accu-Cheks ?-Continue Neurontin for neuropathy ?Recent Labs  ?Lab 05/01/21 ?1132 05/01/21 ?1622 05/01/21 ?2200 05/02/21 ?5638 05/02/21 ?1134  ?GLUCAP 138* 190* 117* 137* 103*  ? ?Dyslipidemia ?-Continue statin ? ?Anxiety and depression ?-continue Cymbalta and BuSpar. ? ?COPD  ?-Continue her inhalers. ?-I counseled her for smoking cessation and will receive further counseling here. ? ?Goals of care ?  Code Status: Full Code  ? ? ?Mobility: Encourage ambulation ? ?Nutritional status:  ?Body mass index is 25.36 kg/m?.  ?  ?  ? ? ? ? ?Diet:  ?Diet Order   ? ?       ?  Diet heart healthy/carb modified Room service appropriate? Yes; Fluid consistency: Thin  Diet effective now       ?  ? ?  ?  ? ?  ? ? ?DVT prophylaxis:  ?enoxaparin (LOVENOX) injection 40 mg Start: 05/02/21 1400 ?  ?Antimicrobials: None ?Fluid: NS with potassium at 100 mill per hour ?Consultants: None ?Family Communication: None at bedside ? ?Status is: Observation ? ?Continue in-hospital care because: Needs IV hydration for next 24 hours ?Level of care: Med-Surg  ? ?Dispo: The patient is from: Home ?             Anticipated d/c is to: Home likely tomorrow ?  Patient currently is medically stable to d/c. ?  Difficult to place patient No ? ? ? ? ?Infusions:  ? 0.9 % NaCl with KCl 20 mEq / L 100 mL/hr at 05/02/21 0217  ? ? ?Scheduled Meds: ? busPIRone  10 mg Oral BID  ? DULoxetine  60 mg Oral Daily  ? enoxaparin (LOVENOX) injection  40 mg Subcutaneous Daily  ? famotidine  20 mg Oral QPM  ? fluticasone furoate-vilanterol  1 puff Inhalation Daily  ? And  ? umeclidinium bromide  1 puff Inhalation Daily  ? gabapentin   300 mg Oral TID  ? insulin aspart  0-9 Units Subcutaneous TID AC & HS  ? linagliptin  5 mg Oral Daily  ? melatonin  5 mg Oral QHS  ? multivitamin with minerals  1 tablet Oral Daily  ? nicotine  21 mg Transdermal Daily  ? pantoprazole  40 mg Oral Daily  ? rosuvastatin  40 mg Oral QHS  ? ? ?PRN meds: ?albuterol, alum & mag hydroxide-simeth, cyclobenzaprine, lidocaine, loperamide, magnesium hydroxide, magnesium oxide, ondansetron **OR** ondansetron (ZOFRAN) IV, traZODone  ? ?Antimicrobials: ?Anti-infectives (From admission, onward)  ? ? None  ? ?  ? ? ?Objective: ?Vitals:  ? 05/02/21 0840 05/02/21 0845  ?BP: 123/79   ?Pulse: 78   ?Resp: 17   ?Temp: 98.2 ?F (36.8 ?C)   ?SpO2: 94% 97%  ? ? ?Intake/Output Summary (Last 24 hours) at 05/02/2021 1301 ?Last data filed at 05/02/2021 1000 ?Gross per 24 hour  ?Intake 240 ml  ?Output --  ?Net 240 ml  ? ?Filed Weights  ? 05/01/21 0158  ?Weight: 58.9 kg  ? ?Weight change:  ?Body mass index is 25.36 kg/m?.  ? ?Physical Exam: ?General exam: Pleasant, middle-aged African-American female.  Not in distress ?Skin: No rashes, lesions or ulcers. ?HEENT: Atraumatic, normocephalic, no obvious bleeding ?Lungs: Clear to auscultation bilaterally ?CVS: Regular rate and rhythm, no murmur ?GI/Abd soft, nontender, nondistended, bowel sound present ?CNS: Alert, awake, oriented x3 ?Psychiatry: Mood appropriate ?Extremities: No pedal edema, no calf tenderness ? ?Data Review: I have personally reviewed the laboratory data and studies available. ? ?F/u labs ordered ?Unresulted Labs (From admission, onward)  ? ?  Start     Ordered  ? 05/02/21 0500  CBC with Differential/Platelet  Daily,   R     ? 05/01/21 1631  ? 05/02/21 3893  Basic metabolic panel  Daily,   R     ? 05/01/21 1631  ? 05/01/21 1023  Gastrointestinal Panel by PCR , Stool  (Gastrointestinal Panel by PCR, Stool                                                                                                                                                      **Does Not include CLOSTRIDIUM DIFFICILE testing. **If CDIFF testing is needed,  place order from the "C Difficile Testing" order set.**)  Once,   R       ? 05/01/21 1022  ? ?  ?  ? ?  ? ? ?Signed, ?Terrilee Croak, MD ?[facility] ?05/02/2021 ? ? ? ? ? ? ? ? ? ? ?  ?

## 2021-05-03 LAB — BASIC METABOLIC PANEL
Anion gap: 6 (ref 5–15)
BUN: 13 mg/dL (ref 8–23)
CO2: 23 mmol/L (ref 22–32)
Calcium: 8.2 mg/dL — ABNORMAL LOW (ref 8.9–10.3)
Chloride: 110 mmol/L (ref 98–111)
Creatinine, Ser: 0.85 mg/dL (ref 0.44–1.00)
GFR, Estimated: >60 mL/min (ref >60)
Glucose, Bld: 100 mg/dL — ABNORMAL HIGH (ref 70–99)
Potassium: 5 mmol/L (ref 3.5–5.1)
Sodium: 139 mmol/L (ref 135–145)

## 2021-05-03 LAB — CBC WITH DIFFERENTIAL/PLATELET
Abs Immature Granulocytes: 0.01 10*3/uL (ref 0.00–0.07)
Basophils Absolute: 0 10*3/uL (ref 0.0–0.1)
Basophils Relative: 1 %
Eosinophils Absolute: 0.1 10*3/uL (ref 0.0–0.5)
Eosinophils Relative: 3 %
HCT: 33.9 % — ABNORMAL LOW (ref 36.0–46.0)
Hemoglobin: 10.8 g/dL — ABNORMAL LOW (ref 12.0–15.0)
Immature Granulocytes: 0 %
Lymphocytes Relative: 37 %
Lymphs Abs: 1.2 10*3/uL (ref 0.7–4.0)
MCH: 30 pg (ref 26.0–34.0)
MCHC: 31.9 g/dL (ref 30.0–36.0)
MCV: 94.2 fL (ref 80.0–100.0)
Monocytes Absolute: 0.3 10*3/uL (ref 0.1–1.0)
Monocytes Relative: 8 %
Neutro Abs: 1.7 10*3/uL (ref 1.7–7.7)
Neutrophils Relative %: 51 %
Platelets: 149 10*3/uL — ABNORMAL LOW (ref 150–400)
RBC: 3.6 MIL/uL — ABNORMAL LOW (ref 3.87–5.11)
RDW: 13.8 % (ref 11.5–15.5)
WBC: 3.2 10*3/uL — ABNORMAL LOW (ref 4.0–10.5)
nRBC: 0 % (ref 0.0–0.2)

## 2021-05-03 LAB — GLUCOSE, CAPILLARY
Glucose-Capillary: 166 mg/dL — ABNORMAL HIGH (ref 70–99)
Glucose-Capillary: 104 mg/dL — ABNORMAL HIGH (ref 70–99)

## 2021-05-03 MED ORDER — ONDANSETRON HCL 4 MG PO TABS
4.0000 mg | ORAL_TABLET | Freq: Four times a day (QID) | ORAL | 0 refills | Status: AC | PRN
Start: 2021-05-03 — End: 2021-05-08

## 2021-05-03 MED ORDER — LOPERAMIDE HCL 2 MG PO CAPS: 2.0000 mg | ORAL_CAPSULE | Freq: Four times a day (QID) | ORAL | 0 refills | Status: AC | PRN

## 2021-05-03 NOTE — Discharge Summary (Signed)
Physician Discharge Summary  [name] EMV:[id number] DOB: 06/19/1960 DOA: 04/30/2021  PCP: [name], [name]  Admit date: 04/30/2021 Discharge date: 05/03/2021  Admitted From: Home Discharge disposition: Home   Brief narrative: [name] is a 61 y.o. female with PMH significant for DM2, HTN, HLD, CKD, back pain, fibromyalgia, anxiety, depression. Patient presented to the ED on 04/30/2021 with progressive nausea, vomiting, diarrhea for last 3 days associated with headache, dizziness, subjective fever. In the ED, patient was afebrile, hypotensive at 64/44, blood pressure improved to 106/69 with IV fluid. Labs with sodium low at 132, creatinine elevated 2.3, AST/ALT elevated 85/62, normal alk phos, CBC unremarkable. CT abdomen showed diverticulosis without diverticulitis, fatty liver. Patient was given IV fluid, IV Zofran Kept in observation to hospitalist service.  Subjective: Patient was seen and examined his morning.   Lying down in bed.  Feels better.  Still nauseous but no vomiting.  -Diarrhea improved.    Principal Problem:   AKI (acute kidney injury) ([place]) Active Problems:   Acute gastroenteritis   Type II diabetes mellitus with peripheral autonomic neuropathy (HCC)   Dyslipidemia   COPD 0 stage with emphysema  on CT/ actively smoking    Anxiety and depression    Assessment and Plan: Acute gastroenteritis -Unclear trilogy of acute gastroenteritis.  Probably viral.   -Vomiting improved.  Diarrhea improving on Imodium.  C. difficile assay and GI pathogen panel negative.  -Currently being monitored off antibiotics.  Continue IV fluid.  Continue PPI.    AKI -Likely due to volume depletion from gastroenteritis -Avoid nephrotoxins.  Stop Mobic.  Hold aspirin -Creatinine improving gradually.  Continue to monitor on IV fluid Recent Labs    05/03/20 1111 11/29/20 1043 04/30/21 1419 05/01/21 0614 05/02/21 0344 05/03/21 0122  BUN $Re'12 12 20 23 17 13   'ztq$ CREATININE 0.91 1.03* 2.30* 1.68* 1.19* 0.85   Hypokalemia -Potassium level improved with replacement Recent Labs  Lab 04/30/21 1419 05/01/21 0614 05/02/21 0344 05/03/21 0122  K 3.6 3.3* 4.4 5.0   Type 2 diabetes mellitus with hyperglycemia Peripheral neuropathy -A1c 7 on 3/8 -Home meds include Januvia, metformin.   -Currently continued on Januvia.  Metformin was held because of AKI.  Renal function improved.  Can resume metformin at home. -Continue Neurontin for neuropathy Recent Labs  Lab 05/02/21 0838 05/02/21 1134 05/02/21 1645 05/02/21 2120 05/03/21 0804  GLUCAP 137* 103* 118* 176* 166*   Hypertension -Continue olmesartan as before.  Dyslipidemia -Continue statin  Anxiety and depression -continue Cymbalta and BuSpar.  COPD  -Continue her inhalers. -Counseled to stop smoking.  Goals of care   Code Status: Full Code    Mobility: Encourage ambulation  Nutritional status:  Body mass index is 25.36 kg/m.        Wounds:  -    Discharge Exam:   Vitals:   05/02/21 1533 05/02/21 2204 05/03/21 0411 05/03/21 0830  BP: (!) 144/100 139/78 (!) 141/98 135/87  Pulse: 79 74 76 71  Resp: $Remo'17 17 17 18  'yrCoV$ Temp: 98.2 F (36.8 C) 97.7 F (36.5 C) 98.2 F (36.8 C) 98.5 F (36.9 C)  TempSrc: Oral Oral Oral Oral  SpO2: 99% 99% 95% 97%  Weight:      Height:        Body mass index is 25.36 kg/m.   General exam: Pleasant, middle-aged African-American female.  Not in distress Skin: No rashes, lesions or ulcers. HEENT: Atraumatic, normocephalic, no obvious bleeding Lungs: Clear to auscultation bilaterally CVS: Regular rate  and rhythm, no murmur GI/Abd soft, nontender, nondistended, bowel sound present CNS: Alert, awake, oriented x3 Psychiatry: Mood appropriate Extremities: No pedal edema, no calf tenderness  Follow ups:    Follow-up Information     [name], [name] Follow up.   Specialty: General Practice Contact information: [address] [place] [address] [phone]         Buford Dresser, MD .   Specialty: Cardiology Contact information: [address] [place] [address] [place] [address] [phone]                 Discharge Instructions:   Discharge Instructions     Call MD for:  difficulty breathing, headache or visual disturbances   Complete by: As directed    Call MD for:  extreme fatigue   Complete by: As directed    Call MD for:  hives   Complete by: As directed    Call MD for:  persistant dizziness or light-headedness   Complete by: As directed    Call MD for:  persistant nausea and vomiting   Complete by: As directed    Call MD for:  severe uncontrolled pain   Complete by: As directed    Call MD for:  temperature >100.4   Complete by: As directed    Diet general   Complete by: As directed    Discharge instructions   Complete by: As directed    General discharge instructions: Follow with Primary MD [name], [name] in 7 days  Please request your PCP  to go over your hospital tests, procedures, radiology results at the follow up. Please get your medicines reviewed and adjusted.  Your PCP may decide to repeat certain labs or tests as needed. Do not drive, operate heavy machinery, perform activities at heights, swimming or participation in water activities or provide baby sitting services if your were admitted for syncope or siezures until you have seen by Primary MD or a Neurologist and advised to do so again. [place] Controlled Substance Reporting System database was reviewed. Do not drive, operate heavy machinery, perform activities at heights, swim, participate in water activities or provide baby-sitting services while on medications for pain, sleep and mood until your outpatient physician has reevaluated you and advised to do so again.  You are strongly recommended to comply with the dose, frequency and duration of prescribed medications. Activity: As  tolerated with Full fall precautions use walker/cane & assistance as needed Avoid using any recreational substances like cigarette, tobacco, alcohol, or non-prescribed drug. If you experience worsening of your admission symptoms, develop shortness of breath, life threatening emergency, suicidal or homicidal thoughts you must seek medical attention immediately by calling 911 or calling your MD immediately  if symptoms less severe. You must read complete instructions/literature along with all the possible adverse reactions/side effects for all the medicines you take and that have been prescribed to you. Take any new medicine only after you have completely understood and accepted all the possible adverse reactions/side effects.  Wear Seat belts while driving. You were cared for by a hospitalist during your hospital stay. If you have any questions about your discharge medications or the care you received while you were in the hospital after you are discharged, you can call the unit and ask to speak with the hospitalist or the covering physician. Once you are discharged, your primary care physician will handle any further medical issues. Please note that NO REFILLS for any discharge medications will be  authorized once you are discharged, as it is imperative that you return to your primary care physician (or establish a relationship with a primary care physician if you do not have one).   Increase activity slowly   Complete by: As directed        Discharge Medications:   Allergies as of 05/03/2021       Reactions   Tylenol [acetaminophen] Itching   With codiene   Pollen Extract    Shellfish Allergy Hives   Peanut-containing Drug Products Hives        Medication List     STOP taking these medications    HYDROcodone bit-homatropine 5-1.5 MG/5ML syrup Commonly known as: Hydromet   meloxicam 7.5 MG tablet Commonly known as: MOBIC   nystatin powder Commonly known as: nystatin        TAKE these medications    acetaminophen 500 MG tablet Commonly known as: TYLENOL Take 1,000 mg by mouth every 6 (six) hours as needed for moderate pain or headache.   albuterol 108 (90 Base) MCG/ACT inhaler Commonly known as: VENTOLIN HFA INHALE 1 PUFF BY MOUTH EVERY 6 HOURS AS NEEDED FOR WHEEZING OR SHORTNESS OF BREATH What changed:  how much to take how to take this when to take this reasons to take this additional instructions   alum & mag hydroxide-simeth [phone] MG/5ML suspension Commonly known as: MAALOX/MYLANTA Take 15 mLs by mouth every 4 (four) hours as needed for indigestion or heartburn.   aspirin 81 MG chewable tablet Chew 81 mg by mouth daily.   blood glucose meter kit and supplies Kit Dispense based on patient and insurance preference. Use up to four times daily as directed. (FOR ICD-9 250.00, 250.01).   busPIRone 10 MG tablet Commonly known as: BUSPAR TAKE ONE TABLET BY MOUTH EVERY DAY What changed: when to take this   cyclobenzaprine 10 MG tablet Commonly known as: FLEXERIL Take 1 tablet (10 mg total) by mouth 3 (three) times daily as needed for muscle spasms.   DULoxetine 60 MG capsule Commonly known as: CYMBALTA Take 1 capsule (60 mg total) by mouth daily.   EPINEPHrine 0.3 mg/0.3 mL Soaj injection Commonly known as: EPI-PEN Inject 0.3 mg into the muscle as needed for anaphylaxis.   famotidine 20 MG tablet Commonly known as: Pepcid One after supper What changed:  how much to take how to take this when to take this additional instructions   gabapentin 300 MG capsule Commonly known as: NEURONTIN Take 1 capsule (300 mg total) by mouth 3 (three) times daily.   lidocaine 5 % Commonly known as: Lidoderm Place 1 patch onto the skin daily. Remove & Discard patch within 12 hours or as directed by MD What changed:  when to take this reasons to take this   loperamide 2 MG capsule Commonly known as: IMODIUM Take 1 capsule (2 mg total) by  mouth every 6 (six) hours as needed for diarrhea or loose stools.   Magnesium 250 MG Tabs Take 1 tablet (250 mg total) by mouth daily. Take with evening meal What changed:  when to take this reasons to take this additional instructions   melatonin 5 MG Tabs Take 5 mg by mouth at bedtime.   metFORMIN 750 MG 24 hr tablet Commonly known as: GLUCOPHAGE-XR TAKE ONE TABLET BY MOUTH EVERY DAY WITH BREAKFAST What changed: See the new instructions.   olmesartan 5 MG tablet Commonly known as: BENICAR TAKE ONE TABLET BY MOUTH EVERY DAY   ondansetron 4 MG tablet  Commonly known as: ZOFRAN Take 1 tablet (4 mg total) by mouth every 6 (six) hours as needed for up to 5 days for nausea.   ONE-A-DAY WOMENS PO Take 1 tablet by mouth daily.   pantoprazole 40 MG tablet Commonly known as: PROTONIX TAKE 1 TABLET BY MOUTH ONCE DAILY(TAKE 30-60 MINUTES BEFORE FIRST MEAL OF THE DAY) What changed:  how much to take how to take this when to take this additional instructions   rosuvastatin 40 MG tablet Commonly known as: CRESTOR Take 1 tablet (40 mg total) by mouth daily. What changed: when to take this   sitaGLIPtin 100 MG tablet Commonly known as: Januvia Take 1 tablet (100 mg total) by mouth daily.   Trelegy Ellipta 200-62.5-25 MCG/ACT Aepb Generic drug: Fluticasone-Umeclidin-Vilant Inhale 1 puff into the lungs daily. Rinse mouth after each use.   VISINE OP Place 1 drop into both eyes daily as needed (dry/itchy eyes).         The results of significant diagnostics from this hospitalization (including imaging, microbiology, ancillary and laboratory) are listed below for reference.    Procedures and Diagnostic Studies:   CT Abdomen Pelvis Wo Contrast  Result Date: 04/30/2021 CLINICAL DATA:  Acute abdominal pain with nausea and vomiting, initial encounter EXAM: CT ABDOMEN AND PELVIS WITHOUT CONTRAST TECHNIQUE: Multidetector CT imaging of the abdomen and pelvis was performed following  the standard protocol without IV contrast. RADIATION DOSE REDUCTION: This exam was performed according to the departmental dose-optimization program which includes automated exposure control, adjustment of the mA and/or kV according to patient size and/or use of iterative reconstruction technique. COMPARISON:  08/21/2017 FINDINGS: Lower chest: No acute abnormality. Hepatobiliary: Fatty infiltration of the liver is noted. Gallbladder is decompressed. Pancreas: Unremarkable. No pancreatic ductal dilatation or surrounding inflammatory changes. Spleen: Normal in size without focal abnormality. Adrenals/Urinary Tract: Adrenal glands are within normal limits. Kidneys are well visualized bilaterally. No definitive renal calculi are seen. No obstructive changes are noted. Ureters are within normal limits. The bladder is partially distended. Stomach/Bowel: Scattered diverticular changes noted without evidence of diverticulitis. No obstructive changes are seen. The appendix is within normal limits. No obstructive changes in the small bowel are seen. The stomach is within normal limits. Vascular/Lymphatic: Aortic atherosclerosis. No enlarged abdominal or pelvic lymph nodes. Reproductive: Status post hysterectomy. No adnexal masses. Other: No abdominal wall hernia or abnormality. No abdominopelvic ascites. Musculoskeletal: Degenerative changes of lumbar spine are noted with mild anterolisthesis of L4 on L5. IMPRESSION: Diverticulosis without diverticulitis. Fatty infiltration of the liver. No acute abnormality noted. Electronically Signed   By: Inez Catalina M.D.   On: 04/30/2021 21:12   DG Chest 2 View  Result Date: 04/30/2021 CLINICAL DATA:  Shortness of breath. EXAM: CHEST - 2 VIEW COMPARISON:  Chest x-ray 10/27/2018. FINDINGS: The heart size and mediastinal contours are within normal limits. Both lungs are clear. The visualized skeletal structures are unremarkable. IMPRESSION: No active cardiopulmonary disease.  Electronically Signed   By: Ronney Asters M.D.   On: 04/30/2021 15:19     Labs:   Basic Metabolic Panel: Recent Labs  Lab 04/30/21 1419 05/01/21 0614 05/02/21 0344 05/03/21 0122  NA 132* 135 137 139  K 3.6 3.3* 4.4 5.0  CL 94* 101 107 110  CO2 $Re'24 23 23 23  'taQ$ GLUCOSE 192* 206* 123* 100*  BUN $Re'20 23 17 13  'bsR$ CREATININE 2.30* 1.68* 1.19* 0.85  CALCIUM 8.9 7.9* 8.2* 8.2*   GFR Estimated Creatinine Clearance: 55.8 mL/min (by C-G formula based  on SCr of 0.85 mg/dL). Liver Function Tests: Recent Labs  Lab 04/30/21 1419  AST 85*  ALT 62*  ALKPHOS 58  BILITOT 0.9  PROT 7.4  ALBUMIN 3.9   Recent Labs  Lab 04/30/21 1419  LIPASE 73*   No results for input(s): AMMONIA in the last 168 hours. Coagulation profile No results for input(s): INR, PROTIME in the last 168 hours.  CBC: Recent Labs  Lab 04/30/21 1419 05/01/21 0614 05/02/21 0344 05/03/21 0122  WBC 8.2 5.1 3.1* 3.2*  NEUTROABS  --   --  1.8 1.7  HGB 14.1 12.3 11.6* 10.8*  HCT 44.0 37.6 36.7 33.9*  MCV 91.5 92.6 94.3 94.2  PLT 263 192 153 149*   Cardiac Enzymes: No results for input(s): CKTOTAL, CKMB, CKMBINDEX, TROPONINI in the last 168 hours. BNP: Invalid input(s): POCBNP CBG: Recent Labs  Lab 05/02/21 0838 05/02/21 1134 05/02/21 1645 05/02/21 2120 05/03/21 0804  GLUCAP 137* 103* 118* 176* 166*   D-Dimer No results for input(s): DDIMER in the last 72 hours. Hgb A1c Recent Labs    05/01/21 0614  HGBA1C 7.0*   Lipid Profile No results for input(s): CHOL, HDL, LDLCALC, TRIG, CHOLHDL, LDLDIRECT in the last 72 hours. Thyroid function studies No results for input(s): TSH, T4TOTAL, T3FREE, THYROIDAB in the last 72 hours.  Invalid input(s): FREET3 Anemia work up No results for input(s): VITAMINB12, FOLATE, FERRITIN, TIBC, IRON, RETICCTPCT in the last 72 hours. Microbiology Recent Results (from the past 240 hour(s))  Resp Panel by RT-PCR (Flu A&B, Covid) Nasopharyngeal Swab     Status: None    Collection Time: 04/30/21  6:20 PM   Specimen: Nasopharyngeal Swab; Nasopharyngeal(NP) swabs in vial transport medium  Result Value Ref Range Status   SARS Coronavirus 2 by RT PCR NEGATIVE NEGATIVE Final    Comment: (NOTE) SARS-CoV-2 target nucleic acids are NOT DETECTED.  The SARS-CoV-2 RNA is generally detectable in upper respiratory specimens during the acute phase of infection. The lowest concentration of SARS-CoV-2 viral copies this assay can detect is 138 copies/mL. A negative result does not preclude SARS-Cov-2 infection and should not be used as the sole basis for treatment or other patient management decisions. A negative result may occur with  improper specimen collection/handling, submission of specimen other than nasopharyngeal swab, presence of viral mutation(s) within the areas targeted by this assay, and inadequate number of viral copies(<138 copies/mL). A negative result must be combined with clinical observations, patient history, and epidemiological information. The expected result is Negative.  Fact Sheet for Patients:  BloggerCourse.com  Fact Sheet for Healthcare Providers:  SeriousBroker.it  This test is no t yet approved or cleared by the [place] FDA and  has been authorized for detection and/or diagnosis of SARS-CoV-2 by FDA under an Emergency Use Authorization (EUA). This EUA will remain  in effect (meaning this test can be used) for the duration of the COVID-19 declaration under Section 564(b)(1) of the Act, 21 U.S.C.section 360bbb-3(b)(1), unless the authorization is terminated  or revoked sooner.       Influenza A by PCR NEGATIVE NEGATIVE Final   Influenza B by PCR NEGATIVE NEGATIVE Final    Comment: (NOTE) The Xpert Xpress SARS-CoV-2/FLU/RSV plus assay is intended as an aid in the diagnosis of influenza from Nasopharyngeal swab specimens and should not be used as a sole basis for treatment.  Nasal washings and aspirates are unacceptable for Xpert Xpress SARS-CoV-2/FLU/RSV testing.  Fact Sheet for Patients: BloggerCourse.com  Fact Sheet for Healthcare Providers: SeriousBroker.it  This test is not yet approved or cleared by the [place] and has been authorized for detection and/or diagnosis of SARS-CoV-2 by FDA under an Emergency Use Authorization (EUA). This EUA will remain in effect (meaning this test can be used) for the duration of the COVID-19 declaration under Section 564(b)(1) of the Act, 21 U.S.C. section 360bbb-3(b)(1), unless the authorization is terminated or revoked.  Performed at [facility], [place] [address], [place], [place] [address]   C Difficile Quick Screen w PCR reflex     Status: None   Collection Time: 05/01/21 10:35 AM   Specimen: STOOL  Result Value Ref Range Status   C Diff antigen NEGATIVE NEGATIVE Final   C Diff toxin NEGATIVE NEGATIVE Final   C Diff interpretation No C. difficile detected.  Final    Comment: Performed at [facility], [place] [address] [place], [place] [address]  Gastrointestinal Panel by PCR , Stool     Status: None   Collection Time: 05/01/21 10:35 AM   Specimen: STOOL  Result Value Ref Range Status   Campylobacter species NOT DETECTED NOT DETECTED Final   Plesimonas shigelloides NOT DETECTED NOT DETECTED Final   Salmonella species NOT DETECTED NOT DETECTED Final   Yersinia enterocolitica NOT DETECTED NOT DETECTED Final   Vibrio species NOT DETECTED NOT DETECTED Final   Vibrio cholerae NOT DETECTED NOT DETECTED Final   Enteroaggregative E coli (EAEC) NOT DETECTED NOT DETECTED Final   Enteropathogenic E coli (EPEC) NOT DETECTED NOT DETECTED Final   Enterotoxigenic E coli (ETEC) NOT DETECTED NOT DETECTED Final   Shiga like toxin producing E coli (STEC) NOT DETECTED NOT DETECTED Final   Shigella/Enteroinvasive E coli (EIEC) NOT DETECTED NOT DETECTED  Final   Cryptosporidium NOT DETECTED NOT DETECTED Final   Cyclospora cayetanensis NOT DETECTED NOT DETECTED Final   Entamoeba histolytica NOT DETECTED NOT DETECTED Final   Giardia lamblia NOT DETECTED NOT DETECTED Final   Adenovirus F40/41 NOT DETECTED NOT DETECTED Final   Astrovirus NOT DETECTED NOT DETECTED Final   Norovirus GI/GII NOT DETECTED NOT DETECTED Final   Rotavirus A NOT DETECTED NOT DETECTED Final   Sapovirus (I, II, IV, and V) NOT DETECTED NOT DETECTED Final    Comment: Performed at [facility], [address], [place], [place] [address]    Time coordinating discharge: 35 minutes  Signed: Ewa Hipp  [facility] 05/03/2021, 11:06 AM

## 2021-05-06 ENCOUNTER — Telehealth: Payer: Self-pay

## 2021-05-06 ENCOUNTER — Other Ambulatory Visit: Payer: Self-pay | Admitting: Nurse Practitioner

## 2021-05-06 DIAGNOSIS — F419 Anxiety disorder, unspecified: Secondary | ICD-10-CM

## 2021-05-06 NOTE — Telephone Encounter (Signed)
Transition Care Management Follow-up Telephone Call ?Date of discharge and from where: 05/03/2021. [facility]  ?How have you been since you were released from the hospital? Pt states she feels pretty good.  ?Any questions or concerns? No ? ?Items Reviewed: ?Did the pt receive and understand the discharge instructions provided? Yes  ?Medications obtained and verified? Yes  ?Other? Yes  ?Any new allergies since your discharge? No  ?Dietary orders reviewed? Yes ?Do you have support at home? Yes  ? ?Home Care and Equipment/Supplies: ?Were home health services ordered? no ?If so, what is the name of the agency? N/a  ?Has the agency set up a time to come to the patient's home? no ?Were any new equipment or medical supplies ordered?  No ?What is the name of the medical supply agency? N/a ?Were you able to get the supplies/equipment? no ?Do you have any questions related to the use of the equipment or supplies? No ? ?Functional Questionnaire: (I = Independent and D = Dependent) ?ADLs: i ? ?Bathing/Dressing- i ? ?Meal Prep- i ? ?Eating- i ? ?Maintaining continence- i ? ?Transferring/Ambulation- i ? ?Managing Meds- i ? ?Follow up appointments reviewed: ? ?PCP Hospital f/u appt confirmed? Yes  Scheduled to see janece moore on n/a @ n/a. ?Specialist Hospital f/u appt confirmed? No  Scheduled to see n/a on n/a @ n/a. ?Are transportation arrangements needed? No  ?If their condition worsens, is the pt aware to call PCP or go to the Emergency Dept.? Yes ?Was the patient provided with contact information for the PCP's office or ED? Yes ?Was to pt encouraged to call back with questions or concerns? Yes  ?

## 2021-05-16 ENCOUNTER — Ambulatory Visit (INDEPENDENT_AMBULATORY_CARE_PROVIDER_SITE_OTHER): Admitting: Nurse Practitioner

## 2021-05-16 ENCOUNTER — Encounter: Payer: Self-pay | Admitting: Nurse Practitioner

## 2021-05-16 ENCOUNTER — Other Ambulatory Visit: Payer: Self-pay

## 2021-05-16 VITALS — BP 124/78 | HR 80 | Temp 98.3°F | Ht 60.6 in | Wt 132.4 lb

## 2021-05-16 DIAGNOSIS — I959 Hypotension, unspecified: Secondary | ICD-10-CM

## 2021-05-16 DIAGNOSIS — Z23 Encounter for immunization: Secondary | ICD-10-CM | POA: Diagnosis not present

## 2021-05-16 DIAGNOSIS — E860 Dehydration: Secondary | ICD-10-CM

## 2021-05-16 DIAGNOSIS — I700 Atherosclerosis of aorta: Secondary | ICD-10-CM | POA: Diagnosis not present

## 2021-05-16 DIAGNOSIS — Z6825 Body mass index (BMI) 25.0-25.9, adult: Secondary | ICD-10-CM

## 2021-05-16 DIAGNOSIS — Z2821 Immunization not carried out because of patient refusal: Secondary | ICD-10-CM

## 2021-05-16 DIAGNOSIS — E1143 Type 2 diabetes mellitus with diabetic autonomic (poly)neuropathy: Secondary | ICD-10-CM

## 2021-05-16 DIAGNOSIS — M797 Fibromyalgia: Secondary | ICD-10-CM

## 2021-05-16 DIAGNOSIS — E782 Mixed hyperlipidemia: Secondary | ICD-10-CM | POA: Diagnosis not present

## 2021-05-16 DIAGNOSIS — R2241 Localized swelling, mass and lump, right lower limb: Secondary | ICD-10-CM

## 2021-05-16 LAB — HEMOGLOBIN A1C
Est. average glucose Bld gHb Est-mCnc: 157 mg/dL
Hgb A1c MFr Bld: 7.1 % — ABNORMAL HIGH (ref 4.8–5.6)

## 2021-05-16 MED ORDER — GABAPENTIN 300 MG PO CAPS: 300.0000 mg | ORAL_CAPSULE | Freq: Two times a day (BID) | ORAL | 1 refills | Status: DC

## 2021-05-16 NOTE — Progress Notes (Signed)
?I,Yamilka J Llittleton,acting as a Education administrator for [employer], [name].,have documented all relevant documentation on the behalf of [name], [name],as directed by  [name], [name] while in the presence of [name], [place].  ? ?This visit occurred during the SARS-CoV-2 public health emergency.  Safety protocols were in place, including screening questions prior to the visit, additional usage of staff PPE, and extensive cleaning of exam room while observing appropriate contact time as indicated for disinfecting solutions. ? ?Subjective:  ?  ? Patient ID: [name] , female    DOB: 03/13/1960 , 61 y.o.   MRN: [id number] ? ? ?Chief Complaint  ?Patient presents with  ? ER F/U  ? ? ?HPI ? ?Patient presents today for a hospital admission f/u for progressive nausea, vomiting and diarrhea for 3 days with headache and dizziness when she arrived at the ER from the office her blood pressure was 64/44 and improved to 106/69 after IV fluids. Her sodium was 132, creatinine had increased to 2.3, ast/alt was up to 85/62 with normal alk phos. CBC was normal. She had a CT scan of abdomen which showed diverticulosis without diverticulitis. Fatty liver. ? ?Since being discharged She reports she is feeling fine.  After her admission she went to stay with her daughter. She reports when she took wellbutrin her heart raced. She is not receiving PT. ? ? ?  ? ?Past Medical History:  ?Diagnosis Date  ? Acute kidney failure with lesion of tubular necrosis ([place]) 11/11/2016  ? Anxiety   ? Back pain   ? Diabetes mellitus   ? Essential hypertension 11/11/2016  ? Fibromyalgia   ? High cholesterol   ? Hypertension   ? Syncope 11/11/2016  ?  ? ?Family History  ?Problem Relation Age of Onset  ? Allergic rhinitis Mother   ? Hypertension Mother   ? Heart failure Mother   ? Allergic rhinitis Father   ? Diabetes Father   ? COPD Father   ? Emphysema Father   ? Heart failure Maternal Grandmother   ? Cancer Maternal Grandfather   ?     LUNG   ? Heart  disease Maternal Grandfather   ? Lung cancer Maternal Grandfather   ? Bronchitis Daughter   ?     TWICE YEARLY  ? Allergic rhinitis Daughter   ? Bronchitis Son   ?     YEARLY  ? Allergic rhinitis Son   ? Food Allergy Grandchild   ? Asthma Neg Hx   ? Immunodeficiency Neg Hx   ? Eczema Neg Hx   ? Atopy Neg Hx   ? Angioedema Neg Hx   ? ? ? ?Current Outpatient Medications:  ?  acetaminophen (TYLENOL) 500 MG tablet, Take 1,000 mg by mouth every 6 (six) hours as needed for moderate pain or headache., Disp: , Rfl:  ?  albuterol (VENTOLIN HFA) 108 (90 Base) MCG/ACT inhaler, INHALE 1 PUFF BY MOUTH EVERY 6 HOURS AS NEEDED FOR WHEEZING OR SHORTNESS OF BREATH (Patient taking differently: Inhale 1 puff into the lungs every 6 (six) hours as needed for wheezing or shortness of breath.), Disp: 9 g, Rfl: 0 ?  alum & mag hydroxide-simeth (MAALOX/MYLANTA) 200-200-20 MG/5ML suspension, Take 15 mLs by mouth every 4 (four) hours as needed for indigestion or heartburn., Disp: 355 mL, Rfl: 0 ?  aspirin 81 MG chewable tablet, Chew 81 mg by mouth daily., Disp: , Rfl:  ?  blood glucose meter kit and supplies KIT, Dispense based on patient and  insurance preference. Use up to four times daily as directed. (FOR ICD-9 250.00, 250.01)., Disp: 1 each, Rfl: 0 ?  busPIRone (BUSPAR) 10 MG tablet, TAKE ONE TABLET BY MOUTH EVERY DAY, Disp: 30 tablet, Rfl: 11 ?  cyclobenzaprine (FLEXERIL) 10 MG tablet, Take 1 tablet (10 mg total) by mouth 3 (three) times daily as needed for muscle spasms., Disp: 30 tablet, Rfl: 2 ?  DULoxetine (CYMBALTA) 60 MG capsule, Take 1 capsule (60 mg total) by mouth daily., Disp: 90 capsule, Rfl: 1 ?  EPINEPHrine 0.3 mg/0.3 mL IJ SOAJ injection, Inject 0.3 mg into the muscle as needed for anaphylaxis., Disp: 1 each, Rfl: 2 ?  famotidine (PEPCID) 20 MG tablet, One after supper (Patient taking differently: Take 20 mg by mouth every evening.), Disp: 30 tablet, Rfl: 11 ?  Fluticasone-Umeclidin-Vilant (TRELEGY ELLIPTA) 200-62.5-25  MCG/INH AEPB, Inhale 1 puff into the lungs daily. Rinse mouth after each use., Disp: 60 each, Rfl: 5 ?  lidocaine (LIDODERM) 5 %, Place 1 patch onto the skin daily. Remove & Discard patch within 12 hours or as directed by MD (Patient taking differently: Place 1 patch onto the skin daily as needed (back pain). Remove & Discard patch within 12 hours or as directed by MD), Disp: 30 patch, Rfl: 1 ?  loperamide (IMODIUM) 2 MG capsule, Take 1 capsule (2 mg total) by mouth every 6 (six) hours as needed for diarrhea or loose stools., Disp: 30 capsule, Rfl: 0 ?  Magnesium 250 MG TABS, Take 1 tablet (250 mg total) by mouth daily. Take with evening meal (Patient taking differently: Take 250 mg by mouth at bedtime as needed (cramping).), Disp: 90 tablet, Rfl: 1 ?  Melatonin 5 MG TABS, Take 5 mg by mouth at bedtime., Disp: , Rfl:  ?  metFORMIN (GLUCOPHAGE-XR) 750 MG 24 hr tablet, TAKE ONE TABLET BY MOUTH EVERY DAY WITH BREAKFAST (Patient taking differently: Take 750 mg by mouth daily with breakfast.), Disp: 90 tablet, Rfl: 0 ?  Multiple Vitamins-Minerals (ONE-A-DAY WOMENS PO), Take 1 tablet by mouth daily., Disp: , Rfl:  ?  olmesartan (BENICAR) 5 MG tablet, TAKE ONE TABLET BY MOUTH EVERY DAY (Patient taking differently: Take 5 mg by mouth daily.), Disp: 90 tablet, Rfl: 0 ?  pantoprazole (PROTONIX) 40 MG tablet, TAKE 1 TABLET BY MOUTH ONCE DAILY(TAKE 30-60 MINUTES BEFORE FIRST MEAL OF THE DAY) (Patient taking differently: Take 40 mg by mouth daily.), Disp: 90 tablet, Rfl: 1 ?  rosuvastatin (CRESTOR) 40 MG tablet, Take 1 tablet (40 mg total) by mouth daily. (Patient taking differently: Take 40 mg by mouth at bedtime.), Disp: 90 tablet, Rfl: 1 ?  sitaGLIPtin (JANUVIA) 100 MG tablet, Take 1 tablet (100 mg total) by mouth daily., Disp: 90 tablet, Rfl: 1 ?  Tetrahydrozoline HCl (VISINE OP), Place 1 drop into both eyes daily as needed (dry/itchy eyes)., Disp: , Rfl:  ?  gabapentin (NEURONTIN) 300 MG capsule, Take 1 capsule (300 mg  total) by mouth 2 (two) times daily., Disp: 180 capsule, Rfl: 1  ? ?Allergies  ?Allergen Reactions  ? Tylenol [Acetaminophen] Itching  ?  With codiene  ? Pollen Extract   ? Shellfish Allergy Hives  ? Peanut-Containing Drug Products Hives  ?  ? ?Review of Systems  ?Constitutional:  Negative for activity change, appetite change and fatigue.  ?Respiratory: Negative.    ?Cardiovascular: Negative.  Negative for chest pain, palpitations and leg swelling.  ?Neurological:  Negative for dizziness and headaches.  ?Psychiatric/Behavioral:  Positive for sleep disturbance. Negative  for agitation.    ? ?Today's Vitals  ? 05/16/21 1620  ?BP: 124/78  ?Pulse: 80  ?Temp: 98.3 ?F (36.8 ?C)  ?Weight: 132 lb 6.4 oz (60.1 kg)  ?Height: 5' 0.6" (1.539 m)  ?PainSc: 6   ?PainLoc: Back  ? ?Body mass index is 25.35 kg/m?.  ?Wt Readings from Last 3 Encounters:  ?05/16/21 132 lb 6.4 oz (60.1 kg)  ?05/01/21 129 lb 13.6 oz (58.9 kg)  ?04/30/21 126 lb (57.2 kg)  ?  ?Objective:  ?Physical Exam ?Vitals reviewed.  ?Constitutional:   ?   General: She is not in acute distress. ?   Appearance: Normal appearance.  ?   Comments: She appears much better this visit  ?Eyes:  ?   Pupils: Pupils are equal, round, and reactive to light.  ?Cardiovascular:  ?   Rate and Rhythm: Normal rate and regular rhythm.  ?   Pulses: Normal pulses.  ?   Heart sounds: Normal heart sounds. No murmur heard. ?Pulmonary:  ?   Effort: Pulmonary effort is normal. No respiratory distress.  ?   Breath sounds: Normal breath sounds. No wheezing.  ?Skin: ?   General: Skin is warm and dry.  ?Neurological:  ?   General: No focal deficit present.  ?   Mental Status: She is alert and oriented to person, place, and time.  ?   Cranial Nerves: No cranial nerve deficit.  ?   Motor: No weakness.  ?Psychiatric:     ?   Mood and Affect: Mood normal.     ?   Behavior: Behavior normal.     ?   Thought Content: Thought content normal.     ?   Judgment: Judgment normal.  ?  ? ?   ?Assessment And Plan:   ?   ?1. Dehydration ?Comments: Resolved after being admitted and having IV infusion ? ?2. Hypotension, unspecified hypotension type ?Comments: Blood pressure is normal  ? ?3. BMI 25.0-25.9,adult ? ?4. Immunizatio

## 2021-05-28 ENCOUNTER — Encounter: Payer: Self-pay | Admitting: Nurse Practitioner

## 2021-05-31 ENCOUNTER — Other Ambulatory Visit: Payer: Self-pay | Admitting: Nurse Practitioner

## 2021-05-31 DIAGNOSIS — R252 Cramp and spasm: Secondary | ICD-10-CM

## 2021-07-10 ENCOUNTER — Telehealth: Payer: Self-pay | Admitting: Cardiology

## 2021-07-10 NOTE — Telephone Encounter (Signed)
Unsure what patient calling about.  ? ?Left message to call back  ? ?

## 2021-07-10 NOTE — Telephone Encounter (Signed)
Pt would like for nurse to give her a call back. Please advise ?

## 2021-07-11 NOTE — Telephone Encounter (Signed)
2nd call attempt, no answer, [id number]

## 2021-07-11 NOTE — Telephone Encounter (Signed)
3rd call attempt, Left message for patient to call back

## 2021-07-24 ENCOUNTER — Other Ambulatory Visit: Payer: Self-pay | Admitting: Nurse Practitioner

## 2021-07-24 DIAGNOSIS — E119 Type 2 diabetes mellitus without complications: Secondary | ICD-10-CM

## 2021-07-24 DIAGNOSIS — M797 Fibromyalgia: Secondary | ICD-10-CM

## 2021-07-25 ENCOUNTER — Other Ambulatory Visit: Payer: Self-pay

## 2021-07-25 DIAGNOSIS — E119 Type 2 diabetes mellitus without complications: Secondary | ICD-10-CM

## 2021-07-25 DIAGNOSIS — I700 Atherosclerosis of aorta: Secondary | ICD-10-CM

## 2021-07-25 DIAGNOSIS — R252 Cramp and spasm: Secondary | ICD-10-CM

## 2021-07-25 DIAGNOSIS — M797 Fibromyalgia: Secondary | ICD-10-CM

## 2021-07-25 MED ORDER — FAMOTIDINE 20 MG PO TABS: 20.0000 mg | ORAL_TABLET | Freq: Every evening | ORAL | 1 refills | Status: DC

## 2021-07-25 MED ORDER — OLMESARTAN MEDOXOMIL 5 MG PO TABS: 5.0000 mg | ORAL_TABLET | Freq: Every day | ORAL | 0 refills | Status: DC

## 2021-07-25 MED ORDER — METFORMIN HCL ER 750 MG PO TB24: ORAL_TABLET | ORAL | 1 refills | Status: DC

## 2021-07-25 MED ORDER — ROSUVASTATIN CALCIUM 40 MG PO TABS: 40.0000 mg | ORAL_TABLET | Freq: Every day | ORAL | 1 refills | Status: DC

## 2021-07-25 MED ORDER — PANTOPRAZOLE SODIUM 40 MG PO TBEC: DELAYED_RELEASE_TABLET | ORAL | 1 refills | Status: DC

## 2021-07-25 MED ORDER — DULOXETINE HCL 60 MG PO CPEP: 60.0000 mg | ORAL_CAPSULE | Freq: Every day | ORAL | 1 refills | Status: DC

## 2021-07-25 MED ORDER — SITAGLIPTIN PHOSPHATE 100 MG PO TABS: 100.0000 mg | ORAL_TABLET | Freq: Every day | ORAL | 1 refills | Status: DC

## 2021-07-25 MED ORDER — CYCLOBENZAPRINE HCL 10 MG PO TABS: ORAL_TABLET | ORAL | 2 refills | Status: DC

## 2021-08-16 ENCOUNTER — Other Ambulatory Visit: Payer: Self-pay | Admitting: Internal Medicine

## 2021-08-16 DIAGNOSIS — Z1231 Encounter for screening mammogram for malignant neoplasm of breast: Secondary | ICD-10-CM

## 2021-08-19 ENCOUNTER — Encounter: Payer: Self-pay | Admitting: Allergy

## 2021-08-19 ENCOUNTER — Ambulatory Visit (INDEPENDENT_AMBULATORY_CARE_PROVIDER_SITE_OTHER): Admitting: Allergy

## 2021-08-19 VITALS — BP 126/82 | HR 84 | Temp 98.2°F | Resp 16 | Ht 60.0 in | Wt 138.2 lb

## 2021-08-19 DIAGNOSIS — J302 Other seasonal allergic rhinitis: Secondary | ICD-10-CM

## 2021-08-19 DIAGNOSIS — J449 Chronic obstructive pulmonary disease, unspecified: Secondary | ICD-10-CM

## 2021-08-19 DIAGNOSIS — H1013 Acute atopic conjunctivitis, bilateral: Secondary | ICD-10-CM

## 2021-08-19 DIAGNOSIS — T781XXD Other adverse food reactions, not elsewhere classified, subsequent encounter: Secondary | ICD-10-CM

## 2021-08-19 DIAGNOSIS — K219 Gastro-esophageal reflux disease without esophagitis: Secondary | ICD-10-CM | POA: Diagnosis not present

## 2021-08-19 DIAGNOSIS — H1010 Acute atopic conjunctivitis, unspecified eye: Secondary | ICD-10-CM

## 2021-08-19 DIAGNOSIS — R040 Epistaxis: Secondary | ICD-10-CM | POA: Diagnosis not present

## 2021-08-19 DIAGNOSIS — Z720 Tobacco use: Secondary | ICD-10-CM

## 2021-08-19 MED ORDER — BUDESONIDE 0.5 MG/2ML IN SUSP: 0.5000 mg | Freq: Two times a day (BID) | RESPIRATORY_TRACT | 2 refills | Status: DC

## 2021-08-19 MED ORDER — TRELEGY ELLIPTA 200-62.5-25 MCG/ACT IN AEPB
INHALATION_SPRAY | RESPIRATORY_TRACT | 2 refills | Status: DC
Start: 2021-08-19 — End: 2022-04-03

## 2021-08-19 MED ORDER — ALBUTEROL SULFATE HFA 108 (90 BASE) MCG/ACT IN AERS: 2.0000 | INHALATION_SPRAY | RESPIRATORY_TRACT | 1 refills | Status: DC | PRN

## 2021-08-19 MED ORDER — EPINEPHRINE 0.3 MG/0.3ML IJ SOAJ: 0.3000 mg | INTRAMUSCULAR | 2 refills | Status: DC | PRN

## 2021-08-19 MED ORDER — FAMOTIDINE 20 MG PO TABS
20.0000 mg | ORAL_TABLET | Freq: Every evening | ORAL | 2 refills | Status: DC
Start: 2021-08-19 — End: 2022-09-09

## 2021-08-19 MED ORDER — MONTELUKAST SODIUM 10 MG PO TABS: 10.0000 mg | ORAL_TABLET | Freq: Every day | ORAL | 2 refills | Status: DC

## 2021-08-19 NOTE — Assessment & Plan Note (Signed)
Past history - Rhino conjunctivitis symptoms mainly in the spring and fall for 50+ years. On AIT briefly but stopped due to hives and tingling sensation in the face. 2022 skin testing showed: Positive to grass, ragweed, dust mites and feathers. Declines nasal sprays. Interim history - not sure why stopped Singulair.  Continue environmental control measures as below.  Restart Singulair (montelukast) 10mg  daily at night.  Use over the counter antihistamines such as Zyrtec (cetirizine), Claritin (loratadine), Allegra (fexofenadine), or Xyzal (levocetirizine) daily as needed. May take twice a day during allergy flares. May switch antihistamines every few months.

## 2021-08-19 NOTE — Assessment & Plan Note (Signed)
.   Apply saline nasal gel in each nostril twice a day for 2 weeks to allow the nasal mucosa to heal . Consider using a humidifier in the winter . Try to keep your blood pressure as normal as possible (120/80)

## 2021-08-21 LAB — CBC WITH DIFFERENTIAL/PLATELET
Basophils Absolute: 0.1 10*3/uL (ref 0.0–0.2)
Basos: 1 %
EOS (ABSOLUTE): 0.1 10*3/uL (ref 0.0–0.4)
Eos: 1 %
Hematocrit: 39.6 % (ref 34.0–46.6)
Hemoglobin: 12.5 g/dL (ref 11.1–15.9)
Immature Grans (Abs): 0 10*3/uL (ref 0.0–0.1)
Immature Granulocytes: 1 %
Lymphocytes Absolute: 1.6 10*3/uL (ref 0.7–3.1)
Lymphs: 25 %
MCH: 28 pg (ref 26.6–33.0)
MCHC: 31.6 g/dL (ref 31.5–35.7)
MCV: 89 fL (ref 79–97)
Monocytes Absolute: 0.3 10*3/uL (ref 0.1–0.9)
Monocytes: 5 %
Neutrophils Absolute: 4.4 10*3/uL (ref 1.4–7.0)
Neutrophils: 67 %
Platelets: 329 10*3/uL (ref 150–450)
RBC: 4.47 x10E6/uL (ref 3.77–5.28)
RDW: 12.8 % (ref 11.7–15.4)
WBC: 6.6 10*3/uL (ref 3.4–10.8)

## 2021-08-21 LAB — IGE: IgE (Immunoglobulin E), Serum: 930 IU/mL — ABNORMAL HIGH (ref 6–495)

## 2021-08-21 NOTE — Progress Notes (Signed)
Please call patient.  Based on results, will recommend to start Xolair injections 375mg  every 2 weeks for asthma. If patient is agreeable please let me know so I can send Tammy a message to start PA process.

## 2021-08-22 ENCOUNTER — Telehealth: Payer: Self-pay | Admitting: Allergy

## 2021-08-22 NOTE — Telephone Encounter (Signed)
Please start PA for Xolair injections 375mg  every 2 weeks for asthma.  Thank you.

## 2021-08-22 NOTE — Telephone Encounter (Signed)
Called patient and l/m to contact me. Will have to get consent signed and try and get Xolair from PAP due to patients Ins Med[employer] by Mail no longer covers/carries Xolair

## 2021-08-30 ENCOUNTER — Ambulatory Visit
Admission: RE | Admit: 2021-08-30 | Discharge: 2021-08-30 | Disposition: A | Discharge status: Home / Self Care | Source: Ambulatory Visit | Attending: Internal Medicine | Admitting: Internal Medicine

## 2021-08-30 ENCOUNTER — Ambulatory Visit (INDEPENDENT_AMBULATORY_CARE_PROVIDER_SITE_OTHER): Admitting: Cardiology

## 2021-08-30 VITALS — BP 112/68 | HR 88 | Ht 60.0 in | Wt 138.9 lb

## 2021-08-30 DIAGNOSIS — I10 Essential (primary) hypertension: Secondary | ICD-10-CM | POA: Diagnosis not present

## 2021-08-30 DIAGNOSIS — I700 Atherosclerosis of aorta: Secondary | ICD-10-CM | POA: Diagnosis not present

## 2021-08-30 DIAGNOSIS — Z716 Tobacco abuse counseling: Secondary | ICD-10-CM

## 2021-08-30 DIAGNOSIS — I2510 Atherosclerotic heart disease of native coronary artery without angina pectoris: Secondary | ICD-10-CM | POA: Diagnosis not present

## 2021-08-30 DIAGNOSIS — Z1231 Encounter for screening mammogram for malignant neoplasm of breast: Secondary | ICD-10-CM

## 2021-08-30 DIAGNOSIS — Z8249 Family history of ischemic heart disease and other diseases of the circulatory system: Secondary | ICD-10-CM

## 2021-08-30 DIAGNOSIS — E7800 Pure hypercholesterolemia, unspecified: Secondary | ICD-10-CM

## 2021-08-30 NOTE — Patient Instructions (Signed)

## 2021-08-30 NOTE — Progress Notes (Signed)
Cardiology Office Note:    Date:  08/30/2021   ID:  [name], DOB 10/03/1960, MRN [id number]  PCP:  [name], [place]  Cardiologist:  Buford Dresser, MD  Referring MD: [name], [name]   CC: Follow up for aortic atherosclerosis and coronary artery calcification  History of Present Illness:    [name] is a 61 y.o. female with a hx of type II diabetes, essential hypertension, hyperlipidemia, fibromyalgia, and tobacco use who is seen for follow-up. She was initially seen 07/02/2020 as a new consult at the request of [name], [name] for the evaluation and management of aortic atherosclerosis and coronary artery calcification.  Cardiovascular risk factors:  Prior clinical ASCVD: none Comorbid conditions, including hypertension, hyperlipidemia, diabetes, chronic kidney disease: diabetes, not on insulin  Metabolic syndrome/Obesity: Highest adult weight 168 lbs Chronic inflammatory conditions: fibromyalgia, chronic back pain, chronic bronchitis Tobacco use history: Lately 1 ppd of cigarettes, had been less before recent stress. Started smoking about 65-46 years old. Quit during pregnancy. Family history: Mother died of CHF at 77. Maternal Grandmother died of CHF at 71. Maternal Grandfather died of CHF and lung disease. Brother has heart condition, possible heart attack. Prior cardiac testing and/or incidental findings on other testing (ie coronary calcium): CT results as noted below Exercise level: Constantly active (baby-sits grandchildren, studying criminology), Can climb 15-20 stairs before having shortness of breath and continues up the stairs slowly. Current diet: Eats out once a week, Mostly eats meals cooked by her sister at home.  At her last appointment, she had concerns with a pinching sensation in her chest she believed was due to excessive coughing or lifting, These episodes did not occur often and were a quick duration. The pain is relieved through movement  and rubbing the area. She also reported having shortness of breath after climbing stairs for longer than 15 minutes. With significant coughing, she occasionally gets short of breath and can be productive.  Today, she states she is feeling alright. However, she recently had an episode of dehydration associated with low blood pressure. She felt like she had a fever, and was vomiting frequently. This has been the second occurrence; her initial episode was in 04/2021.   Since returning home from the hospital she has been feeling better overall. Sometimes she develops nausea about 20-30 minutes after eating. She will take her famotidine. She is trying to stay hydrated and drinks flavored water to help.   Additionally she endorses occasional episodes of epistaxis.  A few weeks ago she saw an allergist and now has issues with asthma/COPD, triggered by certain scents.  She is smoking about 1 ppd. She would like to cut back, but is having difficulty due to life stressors. She is trying nicotine patches.  She denies any palpitations, chest pain, or peripheral edema. No lightheadedness, headaches, syncope, orthopnea, or PND.   Past Medical History:  Diagnosis Date   Acute kidney failure with lesion of tubular necrosis ([place]) 11/11/2016   Anxiety    Back pain    Diabetes mellitus    Essential hypertension 11/11/2016   Fibromyalgia    High cholesterol    Hypertension    Syncope 11/11/2016    Past Surgical History:  Procedure Laterality Date   HAND SURGERY     HYSTEROSCOPY  11/07   D&C   OOPHORECTOMY     BSO   TUBAL LIGATION  1991   VAGINAL HYSTERECTOMY  01/2008   LAVH BSO    Current Medications: Current  Outpatient Medications on File Prior to Visit  Medication Sig   acetaminophen (TYLENOL) 500 MG tablet Take 1,000 mg by mouth every 6 (six) hours as needed for moderate pain or headache.   albuterol (VENTOLIN HFA) 108 (90 Base) MCG/ACT inhaler Inhale 2 puffs into the lungs every 4 (four)  hours as needed for wheezing or shortness of breath (coughing fits).   alum & mag hydroxide-simeth (MAALOX/MYLANTA) 200-200-20 MG/5ML suspension Take 15 mLs by mouth every 4 (four) hours as needed for indigestion or heartburn.   aspirin 81 MG chewable tablet Chew 81 mg by mouth daily.   blood glucose meter kit and supplies KIT Dispense based on patient and insurance preference. Use up to four times daily as directed. (FOR ICD-9 250.00, 250.01).   budesonide (PULMICORT) 0.5 MG/2ML nebulizer solution Take 2 mLs (0.5 mg total) by nebulization in the morning and at bedtime.   busPIRone (BUSPAR) 10 MG tablet TAKE ONE TABLET BY MOUTH EVERY DAY   cyclobenzaprine (FLEXERIL) 10 MG tablet TAKE ONE TABLET BY MOUTH THREE TIMES A DAY AS NEEDED FOR MUSCLE SPASM   DULoxetine (CYMBALTA) 60 MG capsule Take 1 capsule (60 mg total) by mouth daily.   EPINEPHrine 0.3 mg/0.3 mL IJ SOAJ injection Inject 0.3 mg into the muscle as needed for anaphylaxis.   famotidine (PEPCID) 20 MG tablet Take 1 tablet (20 mg total) by mouth every evening.   Fluticasone-Umeclidin-Vilant (TRELEGY ELLIPTA) 200-62.5-25 MCG/ACT AEPB Inhale 1 puff once a day and rinse mouth after each use.   gabapentin (NEURONTIN) 300 MG capsule Take 1 capsule (300 mg total) by mouth 2 (two) times daily.   lidocaine (LIDODERM) 5 % Place 1 patch onto the skin daily. Remove & Discard patch within 12 hours or as directed by MD (Patient taking differently: Place 1 patch onto the skin daily as needed (back pain). Remove & Discard patch within 12 hours or as directed by MD)   loperamide (IMODIUM) 2 MG capsule Take 1 capsule (2 mg total) by mouth every 6 (six) hours as needed for diarrhea or loose stools.   Magnesium 250 MG TABS Take 1 tablet (250 mg total) by mouth daily. Take with evening meal (Patient taking differently: Take 250 mg by mouth at bedtime as needed (cramping).)   Melatonin 5 MG TABS Take 5 mg by mouth at bedtime.   metFORMIN (GLUCOPHAGE-XR) 750 MG 24 hr  tablet TAKE ONE TABLET BY MOUTH EVERY DAY WITH BREAKFAST   montelukast (SINGULAIR) 10 MG tablet Take 1 tablet (10 mg total) by mouth at bedtime.   Multiple Vitamins-Minerals (ONE-A-DAY WOMENS PO) Take 1 tablet by mouth daily.   olmesartan (BENICAR) 5 MG tablet Take 1 tablet (5 mg total) by mouth daily.   pantoprazole (PROTONIX) 40 MG tablet TAKE ONE TABLET BY MOUTH EVERY DAY 30-60 MINUTES BEFORE FIRST MEAL OF  THE DAY   rosuvastatin (CRESTOR) 40 MG tablet Take 1 tablet (40 mg total) by mouth daily.   sitaGLIPtin (JANUVIA) 100 MG tablet Take 1 tablet (100 mg total) by mouth daily.   Tetrahydrozoline HCl (VISINE OP) Place 1 drop into both eyes daily as needed (dry/itchy eyes).   No current facility-administered medications on file prior to visit.     Allergies:   Tylenol [acetaminophen], Pollen extract, Shellfish allergy, and Peanut-containing drug products   Social History   Tobacco Use   Smoking status: Every Day    Packs/day: 1.00    Years: 45.00    Total pack years: 45.00    Types:  Cigarettes   Smokeless tobacco: Never   Tobacco comments:    1 pack/ day months  Vaping Use   Vaping Use: Never used  Substance Use Topics   Alcohol use: Yes    Alcohol/week: 0.0 standard drinks of alcohol    Comment: rare   Drug use: No    Family History: family history includes Allergic rhinitis in her daughter, father, mother, and son; Bronchitis in her daughter and son; COPD in her father; Cancer in her maternal grandfather; Diabetes in her father; Emphysema in her father; Food Allergy in her grandchild; Heart disease in her maternal grandfather; Heart failure in her maternal grandmother and mother; Hypertension in her mother; Lung cancer in her maternal grandfather. There is no history of Asthma, Immunodeficiency, Eczema, Atopy, or Angioedema.  ROS:   Please see the history of present illness. (+) Episodes of dehydration (+) Epistaxis All other systems are reviewed and negative.      EKGs/Labs/Other Studies Reviewed:    The following studies were reviewed today:  CT lung cancer screening 05/15/20 FINDINGS: Cardiovascular: Aortic atherosclerosis. Normal heart size, without pericardial effusion. Lad coronary artery calcification.   Mediastinum/Nodes: No mediastinal or definite hilar adenopathy, given limitations of unenhanced CT.   Lungs/Pleura: No pleural fluid. Moderate centrilobular and paraseptal emphysema. Isolated posterior right upper lobe pulmonary nodule of volume derived equivalent diameter 3.0 mm.   Upper Abdomen: Mild hepatic steatosis. Normal imaged portions of the spleen, stomach, pancreas, adrenal glands, kidneys.   Musculoskeletal: No acute osseous abnormality.   IMPRESSION: 1. Lung-RADS 2, benign appearance or behavior. Continue annual screening with low-dose chest CT without contrast in 12 months. 2. Hepatic steatosis. 3. Aortic Atherosclerosis (ICD10-I70.0) and Emphysema (ICD10-J43.9). 4. Age advanced coronary artery atherosclerosis. Recommend assessment of coronary risk factors and consideration of medical therapy.  11/19/2016 Lexiscan: Study Highlights: Nuclear stress EF: 64%. The left ventricular ejection fraction is normal (55-65%). There was no ST segment deviation noted during stress. The study is normal. This is a low risk study.   Normal resting and stress perfusion. No ischemia or infarction EF 64%   01/05/2017 Echo: Study Conclusions   - Left ventricle: The cavity size was normal. Systolic function was    normal. The estimated ejection fraction was in the range of 60%    to 65%. Wall motion was normal; there were no regional wall    motion abnormalities. Doppler parameters are consistent with    abnormal left ventricular relaxation (grade 1 diastolic    dysfunction). There was no evidence of elevated ventricular    filling pressure by Doppler parameters.  - Aortic valve: There was no regurgitation.  - Aortic  root: The aortic root was normal in size.  - Mitral valve: There was trivial regurgitation.  - Left atrium: The atrium was normal in size.  - Right ventricle: Systolic function was normal.  - Right atrium: The atrium was normal in size.  - Tricuspid valve: There was mild regurgitation.  - Pulmonary arteries: Systolic pressure was within the normal    range.  - Pericardium, extracardiac: There was no pericardial effusion.   01/05/2017 Telemetry Monitoring: Study Highlights: 30-day event Monitor   Only 19 days recorded   Quality: Fair.  Baseline artifact. Predominant rhythm: Sinus rhythm Average heart rate: 92 bpm Max heart rate: 148 bpm Min heart rate: 59 bpm   No arrhythmias noted  07/20/2017 Echo: Study Conclusions   - Left ventricle: The cavity size was normal. Systolic function was    normal. The  estimated ejection fraction was in the range of 60%    to 65%. Wall motion was normal; there were no regional wall    motion abnormalities. Doppler parameters are consistent with    abnormal left ventricular relaxation (grade 1 diastolic    dysfunction). There was no evidence of elevated ventricular    filling pressure by Doppler parameters.  - Mitral valve: There was trivial regurgitation.  - Left atrium: The atrium was normal in size.  - Right ventricle: The cavity size was mildly dilated. Wall    thickness was normal. Systolic function was normal.  - Right atrium: The atrium was normal in size.  - Tricuspid valve: There was mild regurgitation.  - Pulmonary arteries: Systolic pressure was within the normal    range.  - Inferior vena cava: The vessel was normal in size.  - Pericardium, extracardiac: There was no pericardial effusion.  EKG:  EKG is personally reviewed.   08/30/2021: *** 07/02/2020: NSR.  Recent Labs: 04/30/2021: ALT 62 05/03/2021: BUN 13; Creatinine, Ser 0.85; Potassium 5.0; Sodium 139 08/19/2021: Hemoglobin 12.5; Platelets 329  Recent Lipid Panel     Component Value Date/Time   CHOL 244 (H) 11/29/2020 1043   TRIG 300 (H) 11/29/2020 1043   HDL 67 11/29/2020 1043   CHOLHDL 3.6 11/29/2020 1043   CHOLHDL 2.8 02/23/2012 1118   VLDL 45 (H) 02/23/2012 1118   LDLCALC 125 (H) 11/29/2020 1043    Physical Exam:    VS:  BP 112/68 (BP Location: Left Arm, Patient Position: Sitting, Cuff Size: Normal)   Pulse 88   Ht 5' (1.524 m)   Wt 138 lb 14.4 oz (63 kg)   BMI 27.13 kg/m     Wt Readings from Last 3 Encounters:  08/30/21 138 lb 14.4 oz (63 kg)  08/19/21 138 lb 3.2 oz (62.7 kg)  05/16/21 132 lb 6.4 oz (60.1 kg)    GEN: Well nourished, well developed in no acute distress HEENT: Normal, moist mucous membranes NECK: No JVD CARDIAC: regular rhythm, normal S1 and S2, no rubs or gallops. No murmur. VASCULAR: Radial and DP pulses 2+ bilaterally. No carotid bruits RESPIRATORY:  Clear to auscultation without rales, wheezing or rhonchi  ABDOMEN: Soft, non-tender, non-distended MUSCULOSKELETAL:  Ambulates independently SKIN: Warm and dry, no edema NEUROLOGIC:  Alert and oriented x 3. No focal neuro deficits noted. PSYCHIATRIC:  Normal affect    ASSESSMENT:    1. Essential hypertension     PLAN:     Cardiac risk counseling and prevention recommendations: -recommend heart healthy/Mediterranean diet, with whole grains, fruits, vegetable, fish, lean meats, nuts, and olive oil. Limit salt. -recommend moderate walking, 3-5 times/week for 30-50 minutes each session. Aim for at least 150 minutes.week. Goal should be pace of 3 miles/hours, or walking 1.5 miles in 30 minutes -recommend avoidance of tobacco products. Avoid excess alcohol. -ASCVD risk score: The 10-year ASCVD risk score (Arnett DK, et al., 2019) is: 23.4%   Values used to calculate the score:     Age: 42 years     Sex: Female     Is Non-Hispanic African American: Yes     Diabetic: Yes     Tobacco smoker: Yes     Systolic Blood Pressure: 517 mmHg     Is BP treated: Yes      HDL Cholesterol: 67 mg/dL     Total Cholesterol: 244 mg/dL    Plan for follow up: 2 years or sooner as needed.  Buford Dresser, MD, PhD, [facility]    [facility]    Medication Adjustments/Labs and Tests Ordered: Current medicines are reviewed at length with the patient today.  Concerns regarding medicines are outlined above.  Orders Placed This Encounter  Procedures   EKG 12-Lead   No orders of the defined types were placed in this encounter.   Patient Instructions  Medication Instructions:  Your Physician recommend you continue on your current medication as directed.    *If you need a refill on your cardiac medications before your next appointment, please call your pharmacy*   Lab Work: None ordered today   Testing/Procedures: None ordered today   Follow-Up: At [facility], you and your health needs are our priority.  As part of our continuing mission to provide you with exceptional heart care, we have created designated Provider Care Teams.  These Care Teams include your primary Cardiologist (physician) and Advanced Practice Providers (APPs -  Physician Assistants and Nurse Practitioners) who all work together to provide you with the care you need, when you need it.  We recommend signing up for the patient portal called "MyChart".  Sign up information is provided on this After Visit Summary.  MyChart is used to connect with patients for Virtual Visits (Telemedicine).  Patients are able to view lab/test results, encounter notes, upcoming appointments, etc.  Non-urgent messages can be sent to your provider as well.   To learn more about what you can do with MyChart, go to [email or web address].    Your next appointment:   2 year(s)  The format for your next appointment:   In Person  Provider:   Buford Dresser, MD {           I,Jenifer Velasquez,acting as a scribe for Buford Dresser, MD.,have documented all relevant  documentation on the behalf of Buford Dresser, MD,as directed by  Buford Dresser, MD while in the presence of Buford Dresser, MD.   I, Buford Dresser, MD, have reviewed all documentation for this visit. The documentation on 08/30/21 for the exam, diagnosis, procedures, and orders are all accurate and complete.  Signed, Buford Dresser, MD PhD 08/30/2021 7:20 PM    [place]

## 2021-09-03 NOTE — Telephone Encounter (Signed)
Patient returned call and I reached back out to patient to discuss Xolair

## 2021-09-04 ENCOUNTER — Telehealth: Payer: Self-pay

## 2021-09-04 NOTE — Telephone Encounter (Signed)
Patient called stating she was waiting for someone to contact her about the Asthma injections. I did inform her that Tammy tried contacting her, I did give Evadna Donaghy number to contact her next week when she returns from vacation.  [place] [phone]

## 2021-09-05 ENCOUNTER — Ambulatory Visit (INDEPENDENT_AMBULATORY_CARE_PROVIDER_SITE_OTHER): Admitting: Nurse Practitioner

## 2021-09-05 ENCOUNTER — Encounter: Payer: Self-pay | Admitting: Nurse Practitioner

## 2021-09-05 VITALS — BP 120/72 | HR 85 | Temp 98.2°F | Ht 60.0 in | Wt 135.0 lb

## 2021-09-05 DIAGNOSIS — I10 Essential (primary) hypertension: Secondary | ICD-10-CM | POA: Diagnosis not present

## 2021-09-05 DIAGNOSIS — Z720 Tobacco use: Secondary | ICD-10-CM

## 2021-09-05 DIAGNOSIS — E782 Mixed hyperlipidemia: Secondary | ICD-10-CM | POA: Diagnosis not present

## 2021-09-05 DIAGNOSIS — M797 Fibromyalgia: Secondary | ICD-10-CM

## 2021-09-05 DIAGNOSIS — I700 Atherosclerosis of aorta: Secondary | ICD-10-CM

## 2021-09-05 DIAGNOSIS — G4452 New daily persistent headache (NDPH): Secondary | ICD-10-CM | POA: Diagnosis not present

## 2021-09-05 DIAGNOSIS — E1143 Type 2 diabetes mellitus with diabetic autonomic (poly)neuropathy: Secondary | ICD-10-CM | POA: Diagnosis not present

## 2021-09-05 MED ORDER — KETOROLAC TROMETHAMINE 30 MG/ML IJ SOLN
30.0000 mg | Freq: Once | INTRAMUSCULAR | Status: AC
  Administered 2021-09-05: 30 mg via INTRAMUSCULAR

## 2021-09-05 NOTE — Patient Instructions (Signed)

## 2021-09-05 NOTE — Progress Notes (Signed)
I,Tianna Badgett,acting as a Education administrator for [employer], [name].,have documented all relevant documentation on the behalf of [name], [name],as directed by  [name], [name] while in the presence of [name], [place].  Subjective:     Patient ID: [name] , female    DOB: 11/29/1960 , 61 y.o.   MRN: [id number]   Chief Complaint  Patient presents with   Diabetes    HPI  Here for diabetes and hypertension. She has been to her allergist and is back on singular and nebulizer. She is to go in 2 times a month for injections.   She has had 2 teeth to fall out upper in January and second one recently. She goes to Dr Jamse [place] - Dentist. She needed to pay $175 prior to her visit. She was to go in for peridontal cleaning.   Her air conditioner is currently not working so she is using a fan.   Diabetes She presents for her follow-up diabetic visit. She has type 2 diabetes mellitus. Her disease course has been stable. Pertinent negatives for hypoglycemia include no dizziness. Pertinent negatives for diabetes include no chest pain, no polydipsia, no polyphagia and no polyuria. There are no hypoglycemic complications. There are no diabetic complications. Risk factors for coronary artery disease include sedentary lifestyle, hypertension and diabetes mellitus. Current diabetic treatment includes oral agent (monotherapy). She is compliant with treatment all of the time. She has not had a previous visit with a dietitian. She rarely participates in exercise. An ACE inhibitor/angiotensin II receptor blocker is being taken. She does not see a podiatrist.Eye exam is not current.  Hypertension This is a chronic problem. The current episode started more than 1 year ago. The problem is unchanged. The problem is controlled. Pertinent negatives include no anxiety, chest pain or palpitations. There are no associated agents to hypertension. Risk factors for coronary artery disease include obesity and sedentary lifestyle.  Past treatments include angiotensin blockers. There are no compliance problems.  There is no history of angina or kidney disease. There is no history of chronic renal disease.     Past Medical History:  Diagnosis Date   Acute kidney failure with lesion of tubular necrosis ([place]) 11/11/2016   Anxiety    Back pain    Diabetes mellitus    Essential hypertension 11/11/2016   Fibromyalgia    High cholesterol    Hypertension    Syncope 11/11/2016     Family History  Problem Relation Age of Onset   Allergic rhinitis Mother    Hypertension Mother    Heart failure Mother    Allergic rhinitis Father    Diabetes Father    COPD Father    Emphysema Father    Heart failure Maternal Grandmother    Cancer Maternal Grandfather        LUNG    Heart disease Maternal Grandfather    Lung cancer Maternal Grandfather    Bronchitis Daughter        TWICE YEARLY   Allergic rhinitis Daughter    Bronchitis Son        YEARLY   Allergic rhinitis Son    Food Allergy Grandchild    Asthma Neg Hx    Immunodeficiency Neg Hx    Eczema Neg Hx    Atopy Neg Hx    Angioedema Neg Hx      Current Outpatient Medications:    acetaminophen (TYLENOL) 500 MG tablet, Take 1,000 mg by mouth every 6 (six) hours as needed for  moderate pain or headache., Disp: , Rfl:    albuterol (VENTOLIN HFA) 108 (90 Base) MCG/ACT inhaler, Inhale 2 puffs into the lungs every 4 (four) hours as needed for wheezing or shortness of breath (coughing fits)., Disp: 18 g, Rfl: 1   alum & mag hydroxide-simeth (MAALOX/MYLANTA) 200-200-20 MG/5ML suspension, Take 15 mLs by mouth every 4 (four) hours as needed for indigestion or heartburn., Disp: 355 mL, Rfl: 0   aspirin 81 MG chewable tablet, Chew 81 mg by mouth daily., Disp: , Rfl:    blood glucose meter kit and supplies KIT, Dispense based on patient and insurance preference. Use up to four times daily as directed. (FOR ICD-9 250.00, 250.01)., Disp: 1 each, Rfl: 0   budesonide (PULMICORT) 0.5  MG/2ML nebulizer solution, Take 2 mLs (0.5 mg total) by nebulization in the morning and at bedtime., Disp: 120 mL, Rfl: 2   busPIRone (BUSPAR) 10 MG tablet, TAKE ONE TABLET BY MOUTH EVERY DAY, Disp: 30 tablet, Rfl: 11   cyclobenzaprine (FLEXERIL) 10 MG tablet, TAKE ONE TABLET BY MOUTH THREE TIMES A DAY AS NEEDED FOR MUSCLE SPASM, Disp: 30 tablet, Rfl: 2   DULoxetine (CYMBALTA) 60 MG capsule, Take 1 capsule (60 mg total) by mouth daily., Disp: 90 capsule, Rfl: 1   EPINEPHrine 0.3 mg/0.3 mL IJ SOAJ injection, Inject 0.3 mg into the muscle as needed for anaphylaxis., Disp: 1 each, Rfl: 2   famotidine (PEPCID) 20 MG tablet, Take 1 tablet (20 mg total) by mouth every evening., Disp: 90 tablet, Rfl: 2   Fluticasone-Umeclidin-Vilant (TRELEGY ELLIPTA) 200-62.5-25 MCG/ACT AEPB, Inhale 1 puff once a day and rinse mouth after each use., Disp: 180 each, Rfl: 2   gabapentin (NEURONTIN) 300 MG capsule, Take 1 capsule (300 mg total) by mouth 2 (two) times daily., Disp: 180 capsule, Rfl: 1   lidocaine (LIDODERM) 5 %, Place 1 patch onto the skin daily. Remove & Discard patch within 12 hours or as directed by MD (Patient taking differently: Place 1 patch onto the skin daily as needed (back pain). Remove & Discard patch within 12 hours or as directed by MD), Disp: 30 patch, Rfl: 1   loperamide (IMODIUM) 2 MG capsule, Take 1 capsule (2 mg total) by mouth every 6 (six) hours as needed for diarrhea or loose stools., Disp: 30 capsule, Rfl: 0   Magnesium 250 MG TABS, Take 1 tablet (250 mg total) by mouth daily. Take with evening meal (Patient taking differently: Take 250 mg by mouth at bedtime as needed (cramping).), Disp: 90 tablet, Rfl: 1   Melatonin 5 MG TABS, Take 5 mg by mouth at bedtime., Disp: , Rfl:    metFORMIN (GLUCOPHAGE-XR) 750 MG 24 hr tablet, TAKE ONE TABLET BY MOUTH EVERY DAY WITH BREAKFAST, Disp: 90 tablet, Rfl: 1   montelukast (SINGULAIR) 10 MG tablet, Take 1 tablet (10 mg total) by mouth at bedtime., Disp: 90  tablet, Rfl: 2   Multiple Vitamins-Minerals (ONE-A-DAY WOMENS PO), Take 1 tablet by mouth daily., Disp: , Rfl:    olmesartan (BENICAR) 5 MG tablet, Take 1 tablet (5 mg total) by mouth daily., Disp: 90 tablet, Rfl: 0   pantoprazole (PROTONIX) 40 MG tablet, TAKE ONE TABLET BY MOUTH EVERY DAY 30-60 MINUTES BEFORE FIRST MEAL OF  THE DAY, Disp: 90 tablet, Rfl: 1   rosuvastatin (CRESTOR) 40 MG tablet, Take 1 tablet (40 mg total) by mouth daily., Disp: 90 tablet, Rfl: 1   sitaGLIPtin (JANUVIA) 100 MG tablet, Take 1 tablet (100 mg total) by  mouth daily., Disp: 90 tablet, Rfl: 1   Tetrahydrozoline HCl (VISINE OP), Place 1 drop into both eyes daily as needed (dry/itchy eyes)., Disp: , Rfl:    Allergies  Allergen Reactions   Tylenol [Acetaminophen] Itching    With codiene   Pollen Extract    Shellfish Allergy Hives   Peanut-Containing Drug Products Hives     Review of Systems  Constitutional: Negative.   Respiratory: Negative.    Cardiovascular: Negative.  Negative for chest pain and palpitations.  Gastrointestinal: Negative.   Endocrine: Negative for polydipsia, polyphagia and polyuria.  Neurological: Negative.  Negative for dizziness.  Psychiatric/Behavioral: Negative.       Today's Vitals   09/05/21 1038  BP: 120/72  Pulse: 85  Temp: 98.2 F (36.8 C)  TempSrc: Oral  Weight: 135 lb (61.2 kg)  Height: 5' (1.524 m)  PainSc: 6    Body mass index is 26.37 kg/m.  Wt Readings from Last 3 Encounters:  09/05/21 135 lb (61.2 kg)  08/30/21 138 lb 14.4 oz (63 kg)  08/19/21 138 lb 3.2 oz (62.7 kg)    Objective:  Physical Exam Vitals reviewed.  Constitutional:      General: She is not in acute distress.    Appearance: Normal appearance.  Eyes:     Pupils: Pupils are equal, round, and reactive to light.  Cardiovascular:     Rate and Rhythm: Normal rate and regular rhythm.     Pulses: Normal pulses.     Heart sounds: Normal heart sounds. No murmur heard. Pulmonary:     Effort:  Pulmonary effort is normal. No respiratory distress.     Breath sounds: Normal breath sounds. No wheezing.  Skin:    General: Skin is warm and dry.  Neurological:     General: No focal deficit present.     Mental Status: She is alert and oriented to person, place, and time.     Cranial Nerves: No cranial nerve deficit.     Motor: No weakness.  Psychiatric:        Mood and Affect: Mood normal.        Behavior: Behavior normal.        Thought Content: Thought content normal.        Judgment: Judgment normal.         Assessment And Plan:     1. Type II diabetes mellitus with peripheral autonomic neuropathy (HCC) Comments: HgbA1c at last visit improving at last visit, continue current medications. Tolerating medications. - Urine microalbumin-creatinine with uACR - Renal function panel with eGFR - Hemoglobin A1c  2. Essential hypertension Comments: Blood pressure is controlled. Continue current medications.  - BMP8+EGFR  3. Mixed hyperlipidemia Comments: Stable, continue statin, tolerating medications. - Lipid panel  4. Aortic atherosclerosis (HCC) Comments: Continue statin, tolerating well.   5. Fibromyalgia Comments: No concerns at this time.   6. Tobacco abuse Comments: Due for her low dose CT scan Smoking cessation instruction/counseling given:  counseled patient on the dangers of tobacco use, advised patient to stop smoking, and reviewed strategies to maximize success - CT CHEST LUNG CA SCREEN LOW DOSE W/O CM; Future  7. New daily persistent headache Comments: She is now having more persistent daily headaches which is new for her. Will check CT scan brain. No abnormal findings on physical exam.  - ketorolac (TORADOL) 30 MG/ML injection 30 mg - CT HEAD WO CONTRAST (5MM); Future     Patient was given opportunity to ask questions. Patient verbalized understanding  of the plan and was able to repeat key elements of the plan. All questions were answered to their  satisfaction.  [name], [name]     I, [name], [name], have reviewed all documentation for this visit. The documentation on 09/05/21 for the exam, diagnosis, procedures, and orders are all accurate and complete.  IF YOU HAVE BEEN REFERRED TO A SPECIALIST, IT MAY TAKE 1-2 WEEKS TO SCHEDULE/PROCESS THE REFERRAL. IF YOU HAVE NOT HEARD FROM US/SPECIALIST IN TWO WEEKS, PLEASE GIVE [place] A CALL AT [phone].   THE PATIENT IS ENCOURAGED TO PRACTICE SOCIAL DISTANCING DUE TO THE COVID-19 PANDEMIC.

## 2021-09-06 ENCOUNTER — Encounter: Payer: Self-pay | Admitting: Nurse Practitioner

## 2021-09-06 LAB — LIPID PANEL
Chol/HDL Ratio: 2.5 ratio (ref 0.0–4.4)
Cholesterol, Total: 180 mg/dL (ref 100–199)
HDL: 73 mg/dL (ref >39)
LDL Chol Calc (NIH): 74 mg/dL (ref 0–99)
Triglycerides: 206 mg/dL — ABNORMAL HIGH (ref 0–149)
VLDL Cholesterol Cal: 33 mg/dL (ref 5–40)

## 2021-09-06 LAB — BMP8+EGFR
BUN/Creatinine Ratio: 18 (ref 12–28)
BUN: 18 mg/dL (ref 8–27)
CO2: 20 mmol/L (ref 20–29)
Calcium: 10 mg/dL (ref 8.7–10.3)
Chloride: 99 mmol/L (ref 96–106)
Creatinine, Ser: 1.02 mg/dL — ABNORMAL HIGH (ref 0.57–1.00)
Glucose: 248 mg/dL — ABNORMAL HIGH (ref 70–99)
Potassium: 4.4 mmol/L (ref 3.5–5.2)
Sodium: 137 mmol/L (ref 134–144)
eGFR: 63 mL/min/{1.73_m2} (ref >59)

## 2021-09-06 LAB — HEMOGLOBIN A1C
Est. average glucose Bld gHb Est-mCnc: 189 mg/dL
Hgb A1c MFr Bld: 8.2 % — ABNORMAL HIGH (ref 4.8–5.6)

## 2021-09-06 LAB — MICROALBUMIN / CREATININE URINE RATIO
Creatinine, Urine: 109.5 mg/dL
Microalb/Creat Ratio: 25 mg/g creat (ref 0–29)
Microalbumin, Urine: 27.8 ug/mL

## 2021-09-06 LAB — RENAL FUNCTION PANEL
Albumin: 4.9 g/dL (ref 3.9–4.9)
Phosphorus: 3.8 mg/dL (ref 3.0–4.3)

## 2021-09-06 MED ORDER — EZETIMIBE 10 MG PO TABS
10.0000 mg | ORAL_TABLET | Freq: Every day | ORAL | 2 refills | Status: DC
Start: 2021-09-06 — End: 2022-01-23

## 2021-09-09 ENCOUNTER — Other Ambulatory Visit: Payer: Self-pay

## 2021-09-09 DIAGNOSIS — I10 Essential (primary) hypertension: Secondary | ICD-10-CM

## 2021-09-09 MED ORDER — BLOOD PRESSURE CUFF MISC: 1 refills | Status: AC

## 2021-09-09 NOTE — Telephone Encounter (Signed)
Tried to reach patient but voicemail not set up and could not leave message.  Will need to get Xolair from [facility] due to [facility] does not carry same

## 2021-09-17 NOTE — Telephone Encounter (Signed)
Submitted to [facility] for [facility]

## 2021-09-30 NOTE — Telephone Encounter (Signed)
Called patient and advised approval and delivery set and appt made to start therapy

## 2021-10-01 ENCOUNTER — Encounter: Payer: Self-pay | Admitting: Nurse Practitioner

## 2021-10-03 LAB — HM DIABETES EYE EXAM

## 2021-10-07 ENCOUNTER — Ambulatory Visit (INDEPENDENT_AMBULATORY_CARE_PROVIDER_SITE_OTHER): Admitting: *Deleted

## 2021-10-07 DIAGNOSIS — J4550 Severe persistent asthma, uncomplicated: Secondary | ICD-10-CM

## 2021-10-07 MED ORDER — OMALIZUMAB 150 MG/ML ~~LOC~~ SOSY
375.0000 mg | PREFILLED_SYRINGE | SUBCUTANEOUS | Status: DC
  Administered 2021-10-07 – 2022-10-08 (×22): 375 mg via SUBCUTANEOUS

## 2021-10-07 NOTE — Progress Notes (Signed)
Immunotherapy   Patient Details  Name: ZAMARIYA NEAL MRN: [id number] Date of Birth: 10-Feb-1961  10/07/2021  Teodora Medici started injections for  Xolair 375  Frequency: Every 2 Weeks  Epi-Pen: Yes Consent signed and patient instructions given. Patient started Xolair today and received 375mg  injection. Patient waited 30 minutes in office and did not experience any issues.    Aela Bohan Fernandez-Vernon 10/07/2021, 2:29 PM

## 2021-10-16 ENCOUNTER — Encounter (HOSPITAL_BASED_OUTPATIENT_CLINIC_OR_DEPARTMENT_OTHER): Payer: Self-pay | Admitting: Cardiology

## 2021-10-21 ENCOUNTER — Ambulatory Visit (INDEPENDENT_AMBULATORY_CARE_PROVIDER_SITE_OTHER)

## 2021-10-21 ENCOUNTER — Other Ambulatory Visit: Payer: Self-pay | Admitting: Nurse Practitioner

## 2021-10-21 DIAGNOSIS — I10 Essential (primary) hypertension: Secondary | ICD-10-CM

## 2021-10-21 DIAGNOSIS — M797 Fibromyalgia: Secondary | ICD-10-CM

## 2021-10-21 DIAGNOSIS — J4550 Severe persistent asthma, uncomplicated: Secondary | ICD-10-CM | POA: Diagnosis not present

## 2021-10-21 MED ORDER — OLMESARTAN MEDOXOMIL 5 MG PO TABS: 5.0000 mg | ORAL_TABLET | Freq: Every day | ORAL | 1 refills | Status: DC

## 2021-10-21 MED ORDER — GABAPENTIN 300 MG PO CAPS: 300.0000 mg | ORAL_CAPSULE | Freq: Two times a day (BID) | ORAL | 1 refills | Status: DC

## 2021-11-12 ENCOUNTER — Ambulatory Visit (INDEPENDENT_AMBULATORY_CARE_PROVIDER_SITE_OTHER)

## 2021-11-12 DIAGNOSIS — J4540 Moderate persistent asthma, uncomplicated: Secondary | ICD-10-CM

## 2021-11-19 NOTE — Progress Notes (Unsigned)
Follow Up Note  RE: [name] MRN: [id number] DOB: 05/28/1960 Date of Office Visit: 11/20/2021  Referring provider: [name], [place] Primary care provider: [name], [name]  Chief Complaint: No chief complaint on file.  History of Present Illness: I had the pleasure of seeing [name] for a follow up visit at the [facility] on 11/19/2021. She is a 61 y.o. female, who is being followed for asthma/copd on Xolair 375mg  every 2 weeks, allergic rhino conjunctivitis, adverse food reaction, gerd. Her previous allergy office visit was on 08/19/2021 with Dr. Maudie Mercury. Today is a regular follow up visit.  Asthma-COPD overlap syndrome (HCC) Past history - 30+ year smoking history and follows with pulmonology. Noted worsening respiratory symptoms the past 2 years. Currently on Stiolto 2 puffs once a day and using albuterol every other day with good benefit. Takes PPI for reflux. Eos 200, IgE 427. 2022 spirometry showed: restrictive disease with 11% improvement in FEV1 post bronchodilator treatment. Clinically feeling improved.  Interim history - Trelegy 295mcg dose helping but using albuterol once a day at least x 6 months.  Today's spirometry showed some restriction with 16% improvement in FEV1 post bronchodilator treatment. Clinically feeling improved.  Start prednisone taper - monitor glucose level and blood perssure.  Encouraged smoking cessation. Daily controller medication(s): continue Trelegy 217mcg 1 puff once a day and rinse mouth after each use.  During upper respiratory infections/flares:  Start budesonide 0.5mg  nebulizer twice a day for 1-2 weeks until your breathing symptoms return to baseline.  Pretreat with albuterol 2 puffs or albuterol nebulizer.  If you need to use your albuterol nebulizer machine back to back within 15-30 minutes with no relief then please go to the ER/urgent care for further evaluation.  May use albuterol rescue inhaler 2 puffs or  nebulizer every 4 to 6 hours as needed for shortness of breath, chest tightness, coughing, and wheezing. May use albuterol rescue inhaler 2 puffs 5 to 15 minutes prior to strenuous physical activities. Monitor frequency of use.  Nebulizer machine given and demonstrated proper use.  Get bloodwork to see if qualify for asthma biologic. Get spirometry at next visit.   Seasonal and perennial allergic rhinoconjunctivitis Past history - Rhino conjunctivitis symptoms mainly in the spring and fall for 50+ years. On AIT briefly but stopped due to hives and tingling sensation in the face. 2022 skin testing showed: Positive to grass, ragweed, dust mites and feathers. Declines nasal sprays. Interim history - not sure why stopped Singulair. Continue environmental control measures as below. Restart Singulair (montelukast) 10mg  daily at night. Use over the counter antihistamines such as Zyrtec (cetirizine), Claritin (loratadine), Allegra (fexofenadine), or Xyzal (levocetirizine) daily as needed. May take twice a day during allergy flares. May switch antihistamines every few months.   Other adverse food reactions, not elsewhere classified, subsequent encounter Past history - Shellfish caused tingling on her face, some finned fish cause diarrhea, peanuts cause tingling and hives, sesame seeds and poppy seeds cause hives. 2022 skin testing was negative to select foods. 2022 bloodwork was positive to shrimp only. Borderline positive to clam, scallop and lobster. Negative to fish panel, tree nuts, peanuts, sesame seed and poppy seed. Not interested in food challenges. Tolerates finned fish. Interim history - no reactions.  Continue to avoid shellfish, peanuts, sesame seeds, poppy seeds. For mild symptoms you can take over the counter antihistamines such as Benadryl and monitor symptoms closely. If symptoms worsen or if you have severe symptoms including breathing  issues, throat closure, significant swelling, whole body  hives, severe diarrhea and vomiting, lightheadedness then inject epinephrine and seek immediate medical care afterwards. Action plan in place.    Anxiety and depression Controlled.  Continue lifestyle and dietary modifications. Continue Protonix 40mg  daily as before. Nothing to eat/drink for 30 minutes afterwards.  Continue famotidine 20mg  daily at night as before.    Epistaxis Apply saline nasal gel in each nostril twice a day for 2 weeks to allow the nasal mucosa to heal Consider using a humidifier in the winter Try to keep your blood pressure as normal as possible (120/80)   Return in about 3 months (around 11/19/2021).  Assessment and Plan: [name] is a 61 y.o. female with: No problem-specific Assessment & Plan notes found for this encounter.  No follow-ups on file.  No orders of the defined types were placed in this encounter.  Lab Orders  No laboratory test(s) ordered today    Diagnostics: Spirometry:  Tracings reviewed. Her effort: {Blank single:19197::"Good reproducible efforts.","It was hard to get consistent efforts and there is a question as to whether this reflects a maximal maneuver.","Poor effort, data can not be interpreted."} FVC: ***L FEV1: ***L, ***% predicted FEV1/FVC ratio: ***% Interpretation: {Blank single:19197::"Spirometry consistent with mild obstructive disease","Spirometry consistent with moderate obstructive disease","Spirometry consistent with severe obstructive disease","Spirometry consistent with possible restrictive disease","Spirometry consistent with mixed obstructive and restrictive disease","Spirometry uninterpretable due to technique","Spirometry consistent with normal pattern","No overt abnormalities noted given today's efforts"}.  Please see scanned spirometry results for details.  Skin Testing: {Blank single:19197::"Select foods","Environmental allergy panel","Environmental allergy panel and select foods","Food allergy panel","None","Deferred  due to recent antihistamines use"}. *** Results discussed with patient/family.   Medication List:  Current Outpatient Medications  Medication Sig Dispense Refill   acetaminophen (TYLENOL) 500 MG tablet Take 1,000 mg by mouth every 6 (six) hours as needed for moderate pain or headache.     albuterol (VENTOLIN HFA) 108 (90 Base) MCG/ACT inhaler Inhale 2 puffs into the lungs every 4 (four) hours as needed for wheezing or shortness of breath (coughing fits). 18 g 1   alum & mag hydroxide-simeth (MAALOX/MYLANTA) 200-200-20 MG/5ML suspension Take 15 mLs by mouth every 4 (four) hours as needed for indigestion or heartburn. 355 mL 0   aspirin 81 MG chewable tablet Chew 81 mg by mouth daily.     blood glucose meter kit and supplies KIT Dispense based on patient and insurance preference. Use up to four times daily as directed. (FOR ICD-9 250.00, 250.01). 1 each 0   Blood Pressure Monitoring (BLOOD PRESSURE CUFF) MISC Use to check blood pressure twice a day. 1 each 1   budesonide (PULMICORT) 0.5 MG/2ML nebulizer solution Take 2 mLs (0.5 mg total) by nebulization in the morning and at bedtime. 120 mL 2   busPIRone (BUSPAR) 10 MG tablet TAKE ONE TABLET BY MOUTH EVERY DAY 30 tablet 11   cyclobenzaprine (FLEXERIL) 10 MG tablet TAKE ONE TABLET BY MOUTH THREE TIMES A DAY AS NEEDED FOR MUSCLE SPASM 30 tablet 2   DULoxetine (CYMBALTA) 60 MG capsule Take 1 capsule (60 mg total) by mouth daily. 90 capsule 1   EPINEPHrine 0.3 mg/0.3 mL IJ SOAJ injection Inject 0.3 mg into the muscle as needed for anaphylaxis. 1 each 2   ezetimibe (ZETIA) 10 MG tablet Take 1 tablet (10 mg total) by mouth daily. 30 tablet 2   famotidine (PEPCID) 20 MG tablet Take 1 tablet (20 mg total) by mouth every evening. 90 tablet 2   Fluticasone-Umeclidin-Vilant (  TRELEGY ELLIPTA) 200-62.5-25 MCG/ACT AEPB Inhale 1 puff once a day and rinse mouth after each use. 180 each 2   gabapentin (NEURONTIN) 300 MG capsule Take 1 capsule (300 mg total) by  mouth 2 (two) times daily. 180 capsule 1   lidocaine (LIDODERM) 5 % Place 1 patch onto the skin daily. Remove & Discard patch within 12 hours or as directed by MD (Patient taking differently: Place 1 patch onto the skin daily as needed (back pain). Remove & Discard patch within 12 hours or as directed by MD) 30 patch 1   loperamide (IMODIUM) 2 MG capsule Take 1 capsule (2 mg total) by mouth every 6 (six) hours as needed for diarrhea or loose stools. 30 capsule 0   Magnesium 250 MG TABS Take 1 tablet (250 mg total) by mouth daily. Take with evening meal (Patient taking differently: Take 250 mg by mouth at bedtime as needed (cramping).) 90 tablet 1   Melatonin 5 MG TABS Take 5 mg by mouth at bedtime.     metFORMIN (GLUCOPHAGE-XR) 750 MG 24 hr tablet TAKE ONE TABLET BY MOUTH EVERY DAY WITH BREAKFAST 90 tablet 1   montelukast (SINGULAIR) 10 MG tablet Take 1 tablet (10 mg total) by mouth at bedtime. 90 tablet 2   Multiple Vitamins-Minerals (ONE-A-DAY WOMENS PO) Take 1 tablet by mouth daily.     olmesartan (BENICAR) 5 MG tablet Take 1 tablet (5 mg total) by mouth daily. 90 tablet 1   pantoprazole (PROTONIX) 40 MG tablet TAKE ONE TABLET BY MOUTH EVERY DAY 30-60 MINUTES BEFORE FIRST MEAL OF  THE DAY 90 tablet 1   rosuvastatin (CRESTOR) 40 MG tablet Take 1 tablet (40 mg total) by mouth daily. 90 tablet 1   sitaGLIPtin (JANUVIA) 100 MG tablet Take 1 tablet (100 mg total) by mouth daily. 90 tablet 1   Tetrahydrozoline HCl (VISINE OP) Place 1 drop into both eyes daily as needed (dry/itchy eyes).     Current Facility-Administered Medications  Medication Dose Route Frequency Provider Last Rate Last Admin   omalizumab Arvid Right) prefilled syringe 375 mg  375 mg Subcutaneous Q14 Days Garnet Sierras, DO   375 mg at 11/12/21 1025   Allergies: Allergies  Allergen Reactions   Tylenol [Acetaminophen] Itching    With codiene   Pollen Extract    Shellfish Allergy Hives   Peanut-Containing Drug Products Hives   I  reviewed her past medical history, social history, family history, and environmental history and no significant changes have been reported from her previous visit.  Review of Systems  Constitutional:  Negative for appetite change, chills, fever and unexpected weight change.  HENT:  Positive for congestion and nosebleeds. Negative for rhinorrhea and sneezing.   Eyes:  Negative for itching.  Respiratory:  Positive for cough and shortness of breath. Negative for chest tightness and wheezing.   Cardiovascular:  Negative for chest pain.  Gastrointestinal:  Negative for abdominal pain.  Genitourinary:  Negative for difficulty urinating.  Skin:  Negative for rash.  Allergic/Immunologic: Positive for environmental allergies.    Objective: There were no vitals taken for this visit. There is no height or weight on file to calculate BMI. Physical Exam Vitals and nursing note reviewed.  Constitutional:      Appearance: Normal appearance. She is well-developed.  HENT:     Head: Normocephalic and atraumatic.     Right Ear: External ear normal.     Left Ear: External ear normal.     Nose: Nose normal.  Mouth/Throat:     Mouth: Mucous membranes are moist.     Pharynx: Oropharynx is clear.  Eyes:     Conjunctiva/sclera: Conjunctivae normal.  Cardiovascular:     Rate and Rhythm: Normal rate and regular rhythm.     Heart sounds: Normal heart sounds. No murmur heard.    No friction rub. No gallop.  Pulmonary:     Effort: Pulmonary effort is normal.     Breath sounds: Normal breath sounds. No wheezing, rhonchi or rales.  Abdominal:     Palpations: Abdomen is soft.  Musculoskeletal:     Cervical back: Neck supple.  Skin:    General: Skin is warm.     Findings: No rash.  Neurological:     Mental Status: She is alert and oriented to person, place, and time.  Psychiatric:        Behavior: Behavior normal.    Previous notes and tests were reviewed. The plan was reviewed with the  patient/family, and all questions/concerned were addressed.  It was my pleasure to see [name] today and participate in her care. Please feel free to contact me with any questions or concerns.  Sincerely,  Rexene Alberts, DO Allergy & Immunology  [facility] office: [place] office: [phone]

## 2021-11-20 ENCOUNTER — Encounter: Payer: Self-pay | Admitting: Allergy

## 2021-11-20 ENCOUNTER — Ambulatory Visit (INDEPENDENT_AMBULATORY_CARE_PROVIDER_SITE_OTHER): Admitting: Allergy

## 2021-11-20 VITALS — BP 130/72 | HR 91 | Temp 97.1°F | Resp 18 | Ht 60.0 in | Wt 135.6 lb

## 2021-11-20 DIAGNOSIS — J449 Chronic obstructive pulmonary disease, unspecified: Secondary | ICD-10-CM

## 2021-11-20 DIAGNOSIS — J069 Acute upper respiratory infection, unspecified: Secondary | ICD-10-CM | POA: Diagnosis not present

## 2021-11-20 DIAGNOSIS — J302 Other seasonal allergic rhinitis: Secondary | ICD-10-CM | POA: Diagnosis not present

## 2021-11-20 DIAGNOSIS — H1010 Acute atopic conjunctivitis, unspecified eye: Secondary | ICD-10-CM

## 2021-11-20 DIAGNOSIS — T781XXD Other adverse food reactions, not elsewhere classified, subsequent encounter: Secondary | ICD-10-CM

## 2021-11-20 DIAGNOSIS — K219 Gastro-esophageal reflux disease without esophagitis: Secondary | ICD-10-CM

## 2021-11-20 MED ORDER — BENZONATATE 100 MG PO CAPS: 100.0000 mg | ORAL_CAPSULE | Freq: Three times a day (TID) | ORAL | 0 refills | Status: DC | PRN

## 2021-11-20 MED ORDER — AZITHROMYCIN 250 MG PO TABS: ORAL_TABLET | ORAL | 0 refills | Status: DC

## 2021-11-20 MED ORDER — BUDESONIDE 0.5 MG/2ML IN SUSP: 0.5000 mg | Freq: Two times a day (BID) | RESPIRATORY_TRACT | 2 refills | Status: DC

## 2021-11-20 MED ORDER — ALBUTEROL SULFATE (2.5 MG/3ML) 0.083% IN NEBU: 2.5000 mg | INHALATION_SOLUTION | RESPIRATORY_TRACT | 1 refills | Status: DC | PRN

## 2021-11-20 NOTE — Assessment & Plan Note (Signed)
Past history - Shellfish caused tingling on her face, some finned fish cause diarrhea, peanuts cause tingling and hives, sesame seeds and poppy seeds cause hives. 2022 skin testing was negative to select foods. 2022 bloodwork was positive to shrimp only. Borderline positive to clam, scallop and lobster. Negative to fish panel, tree nuts, peanuts, sesame seed and poppy seed. Not interested in food challenges. Tolerates finned fish. Interim history - no reactions.  Continue to avoid shellfish, peanuts, sesame seeds, poppy seeds. For mild symptoms you can take over the counter antihistamines such as Benadryl and monitor symptoms closely. If symptoms worsen or if you have severe symptoms including breathing issues, throat closure, significant swelling, whole body hives, severe diarrhea and vomiting, lightheadedness then inject epinephrine and seek immediate medical care afterwards. Action plan in place.  

## 2021-11-20 NOTE — Assessment & Plan Note (Signed)
Past history - Rhino conjunctivitis symptoms mainly in the spring and fall for 50+ years. On AIT briefly but stopped due to hives and tingling sensation in the face. 2022 skin testing showed: Positive to grass, ragweed, dust mites and feathers. Declines nasal sprays. Interim history - improved.  Continue environmental control measures as below.  Continue Singulair (montelukast) 10mg  daily at night.  Use over the counter antihistamines such as Zyrtec (cetirizine), Claritin (loratadine), Allegra (fexofenadine), or Xyzal (levocetirizine) daily as needed. May take twice a day during allergy flares. May switch antihistamines every few months.

## 2021-11-20 NOTE — Patient Instructions (Addendum)
Possible infection Take zpak as directed. See below for symptomatic management. May take tessalon perles as needed for coughing.  May take honey at night to soothe the throat.   Breathing:  Try to decrease smoking - smoke 1 less cigarette per week. Daily controller medication(s): TAKE Trelegy 233mcg 1 puff once a day and rinse mouth after each use.  Continue Xolair 375mg  every 2 weeks.  During upper respiratory infections/flares:  Start budesonide 0.5mg  nebulizer twice a day for 1-2 weeks until your breathing symptoms return to baseline.  Pretreat with albuterol 2 puffs or albuterol nebulizer.  If you need to use your albuterol nebulizer machine back to back within 15-30 minutes with no relief then please go to the ER/urgent care for further evaluation.  May use albuterol rescue inhaler 2 puffs or nebulizer every 4 to 6 hours as needed for shortness of breath, chest tightness, coughing, and wheezing. May use albuterol rescue inhaler 2 puffs 5 to 15 minutes prior to strenuous physical activities. Monitor frequency of use.  Breathing control goals:  Full participation in all desired activities (may need albuterol before activity) Albuterol use two times or less a week on average (not counting use with activity) Cough interfering with sleep two times or less a month Oral steroids no more than once a year No hospitalizations  Environmental allergies 2022 skin testing: Positive to grass, ragweed, dust mites and feathers.  Continue environmental control measures as below. Continue Singulair (montelukast) 10mg  daily at night. Use over the counter antihistamines such as Zyrtec (cetirizine), Claritin (loratadine), Allegra (fexofenadine), or Xyzal (levocetirizine) daily as needed. May take twice a day during allergy flares. May switch antihistamines every few months.  Food: Continue to avoid shellfish, peanuts, sesame seeds, poppy seeds. For mild symptoms you can take over the counter  antihistamines such as Benadryl and monitor symptoms closely. If symptoms worsen or if you have severe symptoms including breathing issues, throat closure, significant swelling, whole body hives, severe diarrhea and vomiting, lightheadedness then inject epinephrine and seek immediate medical care afterwards. Action plan in place.   Heartburn: Continue lifestyle and dietary modifications. Continue Protonix 40mg  daily as before. Nothing to eat/drink for 30 minutes afterwards.  Continue famotidine 20mg  daily at night as before.   Follow up in 2 months or sooner if needed.  Recommend RSV vaccine. Check with PCP regarding whether you are due for your pneumonia shot.   Drink plenty of fluids. Water, juice, clear broth or warm lemon water are good choices. Avoid caffeine and alcohol, which can dehydrate you. Eat chicken soup. Chicken soup and other warm fluids can be soothing and loosen congestion. Rest. Adjust your room's temperature and humidity. Keep your room warm but not overheated. If the air is dry, a cool-mist humidifier or vaporizer can moisten the air and help ease congestion and coughing. Keep the humidifier clean to prevent the growth of bacteria and molds. Soothe your throat. Perform a saltwater gargle. Dissolve one-quarter to a half teaspoon of salt in a 4- to 8-ounce glass of warm water. This can relieve a sore or scratchy throat temporarily. Use saline nasal drops. To help relieve nasal congestion, try saline nasal drops. You can buy these drops over the counter, and they can help relieve symptoms ? even in children. Take over-the-counter cold and cough medications. For adults and children older than 5, over-the-counter decongestants, antihistamines and pain relievers might offer some symptom relief. However, they won't prevent a cold or shorten its duration.

## 2021-11-20 NOTE — Assessment & Plan Note (Signed)
Stable.  . Continue lifestyle and dietary modifications. . Continue Protonix 40mg  daily. Nothing to eat/drink for 30 minutes afterwards.  . Continue famotidine 20mg  daily at night.

## 2021-11-20 NOTE — Assessment & Plan Note (Signed)
Past history - 30+ year smoking history and follows with pulmonology. Noted worsening respiratory symptoms the past 2 years. Currently on Stiolto 2 puffs once a day and using albuterol every other day with good benefit. Takes PPI for reflux. Eos 200, IgE 427. 2022 spirometry showed: restrictive disease with 11% improvement in FEV1 post bronchodilator treatment. Clinically feeling improved.  Interim history - still using albuterol daily, got confused about what inhalers/nebulizers to do. Still smokes. Increased coughing with discolored phlegm x 1-2 weeks now. Started Xolair 375mg  every 2 weeks in August 2023.  Today's spirometry was unremarkable.   Possible infection o Take zpak as directed. o See below for symptomatic management. o May take tessalon perles as needed for coughing.  o May take honey at night to soothe the throat.  . Try to decrease smoking - smoke 1 less cigarette per week. . Daily controller medication(s): TAKE Trelegy 270mcg 1 puff once a day and rinse mouth after each use.  o Continue Xolair 375mg  every 2 weeks.  . During upper respiratory infections/flares:  . Start budesonide 0.5mg  nebulizer twice a day for 1-2 weeks until your breathing symptoms return to baseline.  . Pretreat with albuterol 2 puffs or albuterol nebulizer.  . If you need to use your albuterol nebulizer machine back to back within 15-30 minutes with no relief then please go to the ER/urgent care for further evaluation.  . May use albuterol rescue inhaler 2 puffs or nebulizer every 4 to 6 hours as needed for shortness of breath, chest tightness, coughing, and wheezing. May use albuterol rescue inhaler 2 puffs 5 to 15 minutes prior to strenuous physical activities. Monitor frequency of use.  Marland Kitchen Up to date with flu vaccine. . Recommend RSV, pneumonia vaccine if not up to date. Declined Covid-19 booster. . Get spirometry at next visit.

## 2021-11-21 ENCOUNTER — Other Ambulatory Visit: Payer: Self-pay | Admitting: Allergy

## 2021-11-26 ENCOUNTER — Ambulatory Visit (INDEPENDENT_AMBULATORY_CARE_PROVIDER_SITE_OTHER)

## 2021-11-26 DIAGNOSIS — J4540 Moderate persistent asthma, uncomplicated: Secondary | ICD-10-CM | POA: Diagnosis not present

## 2021-12-10 ENCOUNTER — Ambulatory Visit

## 2021-12-11 ENCOUNTER — Ambulatory Visit (INDEPENDENT_AMBULATORY_CARE_PROVIDER_SITE_OTHER): Admitting: *Deleted

## 2021-12-11 DIAGNOSIS — J4540 Moderate persistent asthma, uncomplicated: Secondary | ICD-10-CM | POA: Diagnosis not present

## 2021-12-20 ENCOUNTER — Other Ambulatory Visit: Payer: Self-pay | Admitting: Nurse Practitioner

## 2021-12-20 DIAGNOSIS — R252 Cramp and spasm: Secondary | ICD-10-CM

## 2021-12-20 DIAGNOSIS — E119 Type 2 diabetes mellitus without complications: Secondary | ICD-10-CM

## 2021-12-25 ENCOUNTER — Ambulatory Visit

## 2022-01-01 ENCOUNTER — Ambulatory Visit

## 2022-01-07 ENCOUNTER — Ambulatory Visit: Admitting: Nurse Practitioner

## 2022-01-09 ENCOUNTER — Ambulatory Visit (INDEPENDENT_AMBULATORY_CARE_PROVIDER_SITE_OTHER)

## 2022-01-09 DIAGNOSIS — J4540 Moderate persistent asthma, uncomplicated: Secondary | ICD-10-CM | POA: Diagnosis not present

## 2022-01-23 ENCOUNTER — Ambulatory Visit (INDEPENDENT_AMBULATORY_CARE_PROVIDER_SITE_OTHER): Admitting: Nurse Practitioner

## 2022-01-23 ENCOUNTER — Encounter: Payer: Self-pay | Admitting: Nurse Practitioner

## 2022-01-23 ENCOUNTER — Ambulatory Visit (INDEPENDENT_AMBULATORY_CARE_PROVIDER_SITE_OTHER)

## 2022-01-23 VITALS — BP 122/80 | HR 88 | Temp 98.7°F | Ht 60.0 in | Wt 138.0 lb

## 2022-01-23 DIAGNOSIS — I700 Atherosclerosis of aorta: Secondary | ICD-10-CM | POA: Diagnosis not present

## 2022-01-23 DIAGNOSIS — E782 Mixed hyperlipidemia: Secondary | ICD-10-CM | POA: Diagnosis not present

## 2022-01-23 DIAGNOSIS — I10 Essential (primary) hypertension: Secondary | ICD-10-CM

## 2022-01-23 DIAGNOSIS — J4540 Moderate persistent asthma, uncomplicated: Secondary | ICD-10-CM

## 2022-01-23 DIAGNOSIS — E1143 Type 2 diabetes mellitus with diabetic autonomic (poly)neuropathy: Secondary | ICD-10-CM | POA: Diagnosis not present

## 2022-01-23 DIAGNOSIS — Z23 Encounter for immunization: Secondary | ICD-10-CM

## 2022-01-23 MED ORDER — EZETIMIBE 10 MG PO TABS: 10.0000 mg | ORAL_TABLET | Freq: Every day | ORAL | 1 refills | Status: DC

## 2022-01-23 MED ORDER — TETANUS-DIPHTH-ACELL PERTUSSIS 5-2.5-18.5 LF-MCG/0.5 IM SUSP: 0.5000 mL | Freq: Once | INTRAMUSCULAR | 0 refills | Status: AC

## 2022-01-23 NOTE — Patient Instructions (Addendum)
Diabetes Mellitus and Nutrition, Adult When you have diabetes, or diabetes mellitus, it is very important to have healthy eating habits because your blood sugar (glucose) levels are greatly affected by what you eat and drink. Eating healthy foods in the right amounts, at about the same times every day, can help you: Manage your blood glucose. Lower your risk of heart disease. Improve your blood pressure. Reach or maintain a healthy weight. What can affect my meal plan? Every person with diabetes is different, and each person has different needs for a meal plan. Your health care provider may recommend that you work with a dietitian to make a meal plan that is best for you. Your meal plan may vary depending on factors such as: The calories you need. The medicines you take. Your weight. Your blood glucose, blood pressure, and cholesterol levels. Your activity level. Other health conditions you have, such as heart or kidney disease. How do carbohydrates affect me? Carbohydrates, also called carbs, affect your blood glucose level more than any other type of food. Eating carbs raises the amount of glucose in your blood. It is important to know how many carbs you can safely have in each meal. This is different for every person. Your dietitian can help you calculate how many carbs you should have at each meal and for each snack. How does alcohol affect me? Alcohol can cause a decrease in blood glucose (hypoglycemia), especially if you use insulin or take certain diabetes medicines by mouth. Hypoglycemia can be a life-threatening condition. Symptoms of hypoglycemia, such as sleepiness, dizziness, and confusion, are similar to symptoms of having too much alcohol. Do not drink alcohol if: Your health care provider tells you not to drink. You are pregnant, may be pregnant, or are planning to become pregnant. If you drink alcohol: Limit how much you have to: 0-1 drink a day for women. 0-2 drinks a day  for men. Know how much alcohol is in your drink. In the [place] one drink equals one 12 oz bottle of beer (355 mL), one 5 oz glass of wine (148 mL), or one 1 oz glass of hard liquor (44 mL). Keep yourself hydrated with water, diet soda, or unsweetened iced tea. Keep in mind that regular soda, juice, and other mixers may contain a lot of sugar and must be counted as carbs. What are tips for following this plan?  Reading food labels Start by checking the serving size on the Nutrition Facts label of packaged foods and drinks. The number of calories and the amount of carbs, fats, and other nutrients listed on the label are based on one serving of the item. Many items contain more than one serving per package. Check the total grams (g) of carbs in one serving. Check the number of grams of saturated fats and trans fats in one serving. Choose foods that have a low amount or none of these fats. Check the number of milligrams (mg) of salt (sodium) in one serving. Most people should limit total sodium intake to less than 2,300 mg per day. Always check the nutrition information of foods labeled as "low-fat" or "nonfat." These foods may be higher in added sugar or refined carbs and should be avoided. Talk to your dietitian to identify your daily goals for nutrients listed on the label. Shopping Avoid buying canned, pre-made, or processed foods. These foods tend to be high in fat, sodium, and added sugar. Shop around the outside edge of the grocery store. This is where you   will most often find fresh fruits and vegetables, bulk grains, fresh meats, and fresh dairy products. Cooking Use low-heat cooking methods, such as baking, instead of high-heat cooking methods, such as deep frying. Cook using healthy oils, such as olive, canola, or sunflower oil. Avoid cooking with butter, cream, or high-fat meats. Meal planning Eat meals and snacks regularly, preferably at the same times every day. Avoid going long periods  of time without eating. Eat foods that are high in fiber, such as fresh fruits, vegetables, beans, and whole grains. Eat 4-6 oz (112-168 g) of lean protein each day, such as lean meat, chicken, fish, eggs, or tofu. One ounce (oz) (28 g) of lean protein is equal to: 1 oz (28 g) of meat, chicken, or fish. 1 egg.  cup (62 g) of tofu. Eat some foods each day that contain healthy fats, such as avocado, nuts, seeds, and fish. What foods should I eat? Fruits Berries. Apples. Oranges. Peaches. Apricots. Plums. Grapes. Mangoes. Papayas. Pomegranates. Kiwi. Cherries. Vegetables Leafy greens, including lettuce, spinach, kale, chard, collard greens, mustard greens, and cabbage. Beets. Cauliflower. Broccoli. Carrots. Green beans. Tomatoes. Peppers. Onions. Cucumbers. Brussels sprouts. Grains Whole grains, such as whole-wheat or whole-grain bread, crackers, tortillas, cereal, and pasta. Unsweetened oatmeal. Quinoa. Brown or wild rice. Meats and other proteins Seafood. Poultry without skin. Lean cuts of poultry and beef. Tofu. Nuts. Seeds. Dairy Low-fat or fat-free dairy products such as milk, yogurt, and cheese. The items listed above may not be a complete list of foods and beverages you can eat and drink. Contact a dietitian for more information. What foods should I avoid? Fruits Fruits canned with syrup. Vegetables Canned vegetables. Frozen vegetables with butter or cream sauce. Grains Refined white flour and flour products such as bread, pasta, snack foods, and cereals. Avoid all processed foods. Meats and other proteins Fatty cuts of meat. Poultry with skin. Breaded or fried meats. Processed meat. Avoid saturated fats. Dairy Full-fat yogurt, cheese, or milk. Beverages Sweetened drinks, such as soda or iced tea. The items listed above may not be a complete list of foods and beverages you should avoid. Contact a dietitian for more information. Questions to ask a health care provider Do I need  to meet with a certified diabetes care and education specialist? Do I need to meet with a dietitian? What number can I call if I have questions? When are the best times to check my blood glucose? Where to find more information: American Diabetes Association: diabetes.org Academy of Nutrition and Dietetics: eatright.[employer] of Diabetes and Digestive and Kidney Diseases: AmenCredit.is Association of Diabetes Care & Education Specialists: diabeteseducator.org Summary It is important to have healthy eating habits because your blood sugar (glucose) levels are greatly affected by what you eat and drink. It is important to use alcohol carefully. A healthy meal plan will help you manage your blood glucose and lower your risk of heart disease. Your health care provider may recommend that you work with a dietitian to make a meal plan that is best for you. This information is not intended to replace advice given to you by your health care provider. Make sure you discuss any questions you have with your health care provider. Document Revised: 09/14/2019 Document Reviewed: 09/14/2019 Elsevier Patient Education  [place].  Influenza (Flu) Vaccine (Inactivated or Recombinant): What You Need to Know 1. Why get vaccinated? Influenza vaccine can prevent influenza (flu). Flu is a contagious disease that spreads around the [place] every year, usually  between October and May. Anyone can get the flu, but it is more dangerous for some people. Infants and young children, people 51 years and older, pregnant people, and people with certain health conditions or a weakened immune system are at greatest risk of flu complications. Pneumonia, bronchitis, sinus infections, and ear infections are examples of flu-related complications. If you have a medical condition, such as heart disease, cancer, or diabetes, flu can make it worse. Flu can cause fever and chills, sore throat, muscle aches,  fatigue, cough, headache, and runny or stuffy nose. Some people may have vomiting and diarrhea, though this is more common in children than adults. In an average year, thousands of people in the [place] die from flu, and many more are hospitalized. Flu vaccine prevents millions of illnesses and flu-related visits to the doctor each year. 2. Influenza vaccines CDC recommends everyone 6 months and older get vaccinated every flu season. Children 6 months through 51 years of age may need 2 doses during a single flu season. Everyone else needs only 1 dose each flu season. It takes about 2 weeks for protection to develop after vaccination. There are many flu viruses, and they are always changing. Each year a new flu vaccine is made to protect against the influenza viruses believed to be likely to cause disease in the upcoming flu season. Even when the vaccine doesn't exactly match these viruses, it may still provide some protection. Influenza vaccine does not cause flu. Influenza vaccine may be given at the same time as other vaccines. 3. Talk with your health care provider Tell your vaccination provider if the person getting the vaccine: Has had an allergic reaction after a previous dose of influenza vaccine, or has any severe, life-threatening allergies Has ever had Guillain-Barr Syndrome (also called "GBS") In some cases, your health care provider may decide to postpone influenza vaccination until a future visit. Influenza vaccine can be administered at any time during pregnancy. People who are or will be pregnant during influenza season should receive inactivated influenza vaccine. People with minor illnesses, such as a cold, may be vaccinated. People who are moderately or severely ill should usually wait until they recover before getting influenza vaccine. Your health care provider can give you more information. 4. Risks of a vaccine reaction Soreness, redness, and swelling where the shot is  given, fever, muscle aches, and headache can happen after influenza vaccination. There may be a very small increased risk of Guillain-Barr Syndrome (GBS) after inactivated influenza vaccine (the flu shot). Young children who get the flu shot along with pneumococcal vaccine (PCV13) and/or DTaP vaccine at the same time might be slightly more likely to have a seizure caused by fever. Tell your health care provider if a child who is getting flu vaccine has ever had a seizure. People sometimes faint after medical procedures, including vaccination. Tell your provider if you feel dizzy or have vision changes or ringing in the ears. As with any medicine, there is a very remote chance of a vaccine causing a severe allergic reaction, other serious injury, or death. 5. What if there is a serious problem? An allergic reaction could occur after the vaccinated person leaves the clinic. If you see signs of a severe allergic reaction (hives, swelling of the face and throat, difficulty breathing, a fast heartbeat, dizziness, or weakness), call 9-1-1 and get the person to the nearest hospital. For other signs that concern you, call your health care provider. Adverse reactions should be reported to the  Vaccine Adverse Event Reporting System (VAERS). Your health care provider will usually file this report, or you can do it yourself. Visit the VAERS website at www.vaers.SamedayNews.es or call 720-641-0587. VAERS is only for reporting reactions, and VAERS staff members do not give medical advice. 6. The National Vaccine Injury Compensation Program The [employer] Vaccine Injury Compensation Program (VICP) is a federal program that was created to compensate people who may have been injured by certain vaccines. Claims regarding alleged injury or death due to vaccination have a time limit for filing, which may be as short as two years. Visit the VICP website at GoldCloset.com.ee or call 4192191563 to learn about the  program and about filing a claim. 7. How can I learn more? Ask your health care provider. Call your local or state health department. Visit the website of the Food and Drug Administration (FDA) for vaccine package inserts and additional information at TraderRating.uy. Contact the Centers for Disease Control and Prevention (CDC): Call (253) 441-1479 (1-800-CDC-INFO) or Visit CDC's website at https://gibson.com/. Source: CDC Vaccine Information Statement Inactivated Influenza Vaccine (09/30/2019) This same material is available at http://www.wolf.info/ for no charge. This information is not intended to replace advice given to you by your health care provider. Make sure you discuss any questions you have with your health care provider. Document Revised: 01/08/2021 Document Reviewed: 11/01/2020 Elsevier Patient Education  [place].  Call the number below to schedule your low dose CT scan Address: [place], [address], [place] [address] Areas served: [place] and nearby areas Hours:  Open ? Closes 7?PM Phone: [phone]

## 2022-01-23 NOTE — Progress Notes (Signed)
I,Tianna Badgett,acting as a Education administrator for [employer], [name].,have documented all relevant documentation on the behalf of [name], [name],as directed by  [name], [name] while in the presence of [name], [name].  Subjective:     Patient ID: [name] , female    DOB: 1961-01-06 , 61 y.o.   MRN: [id number]   Chief Complaint  Patient presents with   Hypertension   Diabetes    HPI  Here for diabetes and hypertension. She goes to her allergy provider for injections every 2 weeks. Needs a new form.   Hypertension This is a chronic problem. The current episode started more than 1 year ago. The problem is unchanged. The problem is controlled. Pertinent negatives include no anxiety, chest pain or palpitations. There are no associated agents to hypertension. Risk factors for coronary artery disease include obesity and sedentary lifestyle. Past treatments include angiotensin blockers. There are no compliance problems.  There is no history of angina or kidney disease. There is no history of chronic renal disease.  Diabetes She presents for her follow-up diabetic visit. She has type 2 diabetes mellitus. Her disease course has been stable. Pertinent negatives for hypoglycemia include no dizziness. Pertinent negatives for diabetes include no chest pain, no polydipsia, no polyphagia and no polyuria. There are no hypoglycemic complications. There are no diabetic complications. Risk factors for coronary artery disease include sedentary lifestyle, hypertension and diabetes mellitus. Current diabetic treatment includes oral agent (monotherapy). She is compliant with treatment all of the time. She has not had a previous visit with a dietitian. She rarely participates in exercise. An ACE inhibitor/angiotensin II receptor blocker is being taken. She does not see a podiatrist.Eye exam is not current.     Past Medical History:  Diagnosis Date   Acute kidney failure with lesion of tubular necrosis ([place])  11/11/2016   Anxiety    Back pain    Diabetes mellitus    Essential hypertension 11/11/2016   Fibromyalgia    High cholesterol    Hypertension    Syncope 11/11/2016     Family History  Problem Relation Age of Onset   Allergic rhinitis Mother    Hypertension Mother    Heart failure Mother    Allergic rhinitis Father    Diabetes Father    COPD Father    Emphysema Father    Heart failure Maternal Grandmother    Cancer Maternal Grandfather        LUNG    Heart disease Maternal Grandfather    Lung cancer Maternal Grandfather    Bronchitis Daughter        TWICE YEARLY   Allergic rhinitis Daughter    Bronchitis Son        YEARLY   Allergic rhinitis Son    Food Allergy Grandchild    Asthma Neg Hx    Immunodeficiency Neg Hx    Eczema Neg Hx    Atopy Neg Hx    Angioedema Neg Hx      Current Outpatient Medications:    acetaminophen (TYLENOL) 500 MG tablet, Take 1,000 mg by mouth every 6 (six) hours as needed for moderate pain or headache., Disp: , Rfl:    albuterol (PROVENTIL) (2.5 MG/3ML) 0.083% nebulizer solution, INHALE THE CONTENTS OF ONE VIAL BY NEBULIZER EVERY 4 HOURS AS NEEDED FOR WHEEZING, SHORTNESS OF BREATH OR COUGHING FITS, Disp: 75 mL, Rfl: 1   alum & mag hydroxide-simeth (MAALOX/MYLANTA) 200-200-20 MG/5ML suspension, Take 15 mLs by mouth every 4 (four) hours  as needed for indigestion or heartburn., Disp: 355 mL, Rfl: 0   amoxicillin-clavulanate (AUGMENTIN) 875-125 MG tablet, Take 1 tablet by mouth 2 (two) times daily for 5 days., Disp: 10 tablet, Rfl: 0   aspirin 81 MG chewable tablet, Chew 81 mg by mouth daily., Disp: , Rfl:    benzonatate (TESSALON) 100 MG capsule, Take 1 capsule (100 mg total) by mouth every 8 (eight) hours., Disp: 21 capsule, Rfl: 0   blood glucose meter kit and supplies KIT, Dispense based on patient and insurance preference. Use up to four times daily as directed. (FOR ICD-9 250.00, 250.01)., Disp: 1 each, Rfl: 0   Blood Pressure Monitoring (BLOOD  PRESSURE CUFF) MISC, Use to check blood pressure twice a day., Disp: 1 each, Rfl: 1   budesonide (PULMICORT) 0.5 MG/2ML nebulizer solution, Take 2 mLs (0.5 mg total) by nebulization in the morning and at bedtime. Use for 1-2 weeks during breathing flares., Disp: 120 mL, Rfl: 2   busPIRone (BUSPAR) 10 MG tablet, TAKE ONE TABLET BY MOUTH EVERY DAY, Disp: 30 tablet, Rfl: 11   CRESTOR 40 MG tablet, TAKE ONE TABLET BY MOUTH EVERY DAY, Disp: 90 tablet, Rfl: 1   cyclobenzaprine (FLEXERIL) 10 MG tablet, TAKE ONE TABLET BY MOUTH THREE TIMES A DAY AS NEEDED FOR MUSCLE SPASM, Disp: 30 tablet, Rfl: 2   DULoxetine (CYMBALTA) 60 MG capsule, TAKE 1 CAPSULE BY MOUTH EVERY DAY, Disp: 90 capsule, Rfl: 1   EPIPEN 2-PAK 0.3 MG/0.3ML SOAJ injection, INJECT 0.3MG (ONE INJECTION) INTRAMUSCULARLY AS DIRECTED AS NEEDED FOR ANAPHYLAXIS - (MAY REPEAT IN 5-15 MINUTES IF NEEDED; MORE THAN 2 DOSES SHOULD ONLY BE ADMINISTERED UNDER DIRECT MEDICAL SUPERVISION), Disp: 2 each, Rfl: 2   ezetimibe (ZETIA) 10 MG tablet, TAKE ONE TABLET BY MOUTH EVERY DAY, Disp: 30 tablet, Rfl: 2   famotidine (PEPCID) 20 MG tablet, Take 1 tablet (20 mg total) by mouth every evening., Disp: 90 tablet, Rfl: 2   fluconazole (DIFLUCAN) 150 MG tablet, Take 1 tablet (150 mg total) by mouth daily. Take 1 tablet by mouth, if no improvement after 72 hours may take the second tablet, Disp: 2 tablet, Rfl: 0   Fluticasone-Umeclidin-Vilant (TRELEGY ELLIPTA) 200-62.5-25 MCG/ACT AEPB, Inhale 1 puff once a day and rinse mouth after each use. (Patient not taking: Reported on 11/20/2021), Disp: 180 each, Rfl: 2   gabapentin (NEURONTIN) 300 MG capsule, TAKE ONE CAPSULE BY MOUTH TWICE A DAY, Disp: 180 capsule, Rfl: 1   JANUVIA 100 MG tablet, TAKE ONE TABLET BY MOUTH EVERY DAY, Disp: 90 tablet, Rfl: 1   lidocaine (LIDODERM) 5 %, Place 1 patch onto the skin daily as needed (back pain). Remove & Discard patch within 12 hours or as directed by MD, Disp: 30 patch, Rfl: 1    loperamide (IMODIUM) 2 MG capsule, Take 1 capsule (2 mg total) by mouth every 6 (six) hours as needed for diarrhea or loose stools., Disp: 30 capsule, Rfl: 0   Magnesium 250 MG TABS, Take 1 tablet (250 mg total) by mouth daily. Take with evening meal (Patient taking differently: Take 250 mg by mouth at bedtime as needed (cramping).), Disp: 90 tablet, Rfl: 1   Melatonin 5 MG TABS, Take 5 mg by mouth at bedtime., Disp: , Rfl:    metFORMIN (GLUCOPHAGE-XR) 750 MG 24 hr tablet, TAKE ONE TABLET BY MOUTH EVERY DAY WITH BREAKFAST, Disp: 90 tablet, Rfl: 1   montelukast (SINGULAIR) 10 MG tablet, Take 1 tablet (10 mg total) by mouth at bedtime., Disp:  90 tablet, Rfl: 2   Multiple Vitamins-Minerals (ONE-A-DAY WOMENS PO), Take 1 tablet by mouth daily., Disp: , Rfl:    olmesartan (BENICAR) 5 MG tablet, TAKE ONE TABLET BY MOUTH EVERY DAY, Disp: 90 tablet, Rfl: 1   pantoprazole (PROTONIX) 40 MG tablet, TAKE ONE TABLET BY MOUTH EVERY DAY 30 TO 60 MINUTES BEFORE FIRST MEAL OF THE DAY, Disp: 90 tablet, Rfl: 1   PROAIR HFA 108 (90 Base) MCG/ACT inhaler, INHALE TWO PUFFS BY MOUTH EVERY 4 HOURS AS NEEDED FOR WHEEZING OR SHORTNESS OF BREATH (COUGHING FITS), Disp: 1 each, Rfl: 1   Tetrahydrozoline HCl (VISINE OP), Place 1 drop into both eyes daily as needed (dry/itchy eyes)., Disp: , Rfl:   Current Facility-Administered Medications:    omalizumab Arvid Right) prefilled syringe 375 mg, 375 mg, Subcutaneous, Q14 Days, Garnet Sierras, DO, 375 mg at 02/07/22 1003   Allergies  Allergen Reactions   Tylenol [Acetaminophen] Itching    With codiene   Pollen Extract    Shellfish Allergy Hives   Peanut-Containing Drug Products Hives     Review of Systems  Constitutional: Negative.   Respiratory: Negative.    Cardiovascular: Negative.  Negative for chest pain and palpitations.  Gastrointestinal: Negative.   Endocrine: Negative for polydipsia, polyphagia and polyuria.  Neurological: Negative.  Negative for dizziness.      Today's Vitals   01/23/22 1147  BP: 122/80  Pulse: 88  Temp: 98.7 F (37.1 C)  TempSrc: Oral  Weight: 138 lb (62.6 kg)  Height: 5' (1.524 m)   Body mass index is 26.95 kg/m.  Wt Readings from Last 3 Encounters:  01/23/22 138 lb (62.6 kg)  11/20/21 135 lb 9.6 oz (61.5 kg)  09/05/21 135 lb (61.2 kg)    Objective:  Physical Exam Vitals reviewed.  Constitutional:      General: She is not in acute distress.    Appearance: Normal appearance.  Eyes:     Pupils: Pupils are equal, round, and reactive to light.  Cardiovascular:     Rate and Rhythm: Normal rate and regular rhythm.     Pulses: Normal pulses.     Heart sounds: Normal heart sounds. No murmur heard. Pulmonary:     Effort: Pulmonary effort is normal. No respiratory distress.     Breath sounds: Normal breath sounds. No wheezing.     Comments: Productive cough  Skin:    General: Skin is warm and dry.  Neurological:     General: No focal deficit present.     Mental Status: She is alert and oriented to person, place, and time.     Cranial Nerves: No cranial nerve deficit.     Motor: No weakness.  Psychiatric:        Mood and Affect: Mood normal.        Behavior: Behavior normal.        Thought Content: Thought content normal.        Judgment: Judgment normal.         Assessment And Plan:     1. Essential hypertension Comments: Blood pressure is well controlled, continue current medications - BMP8+eGFR  2. Type II diabetes mellitus with peripheral autonomic neuropathy (HCC) Comments: HgbA1c is stable, continue current medications. - Hemoglobin A1c - Lipid panel  3. Aortic atherosclerosis (HCC) Comments: Continue statin, tolerating well  4. Mixed hyperlipidemia Comments: Stable, continue statin, tolerating medications. - Lipid panel  5. Need for influenza vaccination - Flu Vaccine QUAD 6+ mos PF IM (Fluarix Quad PF)  6. Encounter for immunization - Tdap ([place]) 5-2.5-18.5 LF-MCG/0.5 injection;  Inject 0.5 mLs into the muscle once for 1 dose.  Dispense: 0.5 mL; Refill: 0     Patient was given opportunity to ask questions. Patient verbalized understanding of the plan and was able to repeat key elements of the plan. All questions were answered to their satisfaction.  [name], [name]   I, [name], [name], have reviewed all documentation for this visit. The documentation on 02/10/22 for the exam, diagnosis, procedures, and orders are all accurate and complete.   IF YOU HAVE BEEN REFERRED TO A SPECIALIST, IT MAY TAKE 1-2 WEEKS TO SCHEDULE/PROCESS THE REFERRAL. IF YOU HAVE NOT HEARD FROM US/SPECIALIST IN TWO WEEKS, PLEASE GIVE [place] A CALL AT [phone] X [phone].   THE PATIENT IS ENCOURAGED TO PRACTICE SOCIAL DISTANCING DUE TO THE COVID-19 PANDEMIC.

## 2022-01-24 LAB — BMP8+EGFR
BUN/Creatinine Ratio: 11 — ABNORMAL LOW (ref 12–28)
BUN: 10 mg/dL (ref 8–27)
CO2: 21 mmol/L (ref 20–29)
Calcium: 9.6 mg/dL (ref 8.7–10.3)
Chloride: 100 mmol/L (ref 96–106)
Creatinine, Ser: 0.95 mg/dL (ref 0.57–1.00)
Glucose: 169 mg/dL — ABNORMAL HIGH (ref 70–99)
Potassium: 4.4 mmol/L (ref 3.5–5.2)
Sodium: 139 mmol/L (ref 134–144)
eGFR: 68 mL/min/{1.73_m2} (ref >59)

## 2022-01-24 LAB — LIPID PANEL
Chol/HDL Ratio: 1.9 ratio (ref 0.0–4.4)
Cholesterol, Total: 111 mg/dL (ref 100–199)
HDL: 60 mg/dL (ref >39)
LDL Chol Calc (NIH): 28 mg/dL (ref 0–99)
Triglycerides: 139 mg/dL (ref 0–149)
VLDL Cholesterol Cal: 23 mg/dL (ref 5–40)

## 2022-01-24 LAB — HEMOGLOBIN A1C
Est. average glucose Bld gHb Est-mCnc: 280 mg/dL
Hgb A1c MFr Bld: 11.4 % — ABNORMAL HIGH (ref 4.8–5.6)

## 2022-01-27 ENCOUNTER — Other Ambulatory Visit: Payer: Self-pay | Admitting: Nurse Practitioner

## 2022-01-27 ENCOUNTER — Other Ambulatory Visit: Payer: Self-pay | Admitting: Allergy

## 2022-01-27 DIAGNOSIS — I10 Essential (primary) hypertension: Secondary | ICD-10-CM

## 2022-01-27 DIAGNOSIS — E1143 Type 2 diabetes mellitus with diabetic autonomic (poly)neuropathy: Secondary | ICD-10-CM

## 2022-01-27 DIAGNOSIS — E782 Mixed hyperlipidemia: Secondary | ICD-10-CM

## 2022-01-27 DIAGNOSIS — I700 Atherosclerosis of aorta: Secondary | ICD-10-CM

## 2022-01-27 DIAGNOSIS — E119 Type 2 diabetes mellitus without complications: Secondary | ICD-10-CM

## 2022-01-27 DIAGNOSIS — M797 Fibromyalgia: Secondary | ICD-10-CM

## 2022-01-30 ENCOUNTER — Other Ambulatory Visit (HOSPITAL_COMMUNITY): Payer: Self-pay | Admitting: Gastroenterology

## 2022-01-30 DIAGNOSIS — R1011 Right upper quadrant pain: Secondary | ICD-10-CM

## 2022-02-05 ENCOUNTER — Telehealth: Payer: Self-pay

## 2022-02-05 NOTE — Telephone Encounter (Signed)
Attempted to call patient. Mailbox not set up. Form ready for pick up. Up front.

## 2022-02-07 ENCOUNTER — Ambulatory Visit (INDEPENDENT_AMBULATORY_CARE_PROVIDER_SITE_OTHER)

## 2022-02-07 DIAGNOSIS — J4540 Moderate persistent asthma, uncomplicated: Secondary | ICD-10-CM

## 2022-02-08 ENCOUNTER — Ambulatory Visit (INDEPENDENT_AMBULATORY_CARE_PROVIDER_SITE_OTHER)

## 2022-02-08 ENCOUNTER — Other Ambulatory Visit: Payer: Self-pay

## 2022-02-08 ENCOUNTER — Ambulatory Visit (HOSPITAL_COMMUNITY)
Admission: EM | Admit: 2022-02-08 | Discharge: 2022-02-08 | Disposition: A | Discharge status: Home / Self Care | Attending: Physician Assistant | Admitting: Physician Assistant

## 2022-02-08 ENCOUNTER — Encounter (HOSPITAL_COMMUNITY): Payer: Self-pay | Admitting: Emergency Medicine

## 2022-02-08 DIAGNOSIS — Z23 Encounter for immunization: Secondary | ICD-10-CM

## 2022-02-08 DIAGNOSIS — B3731 Acute candidiasis of vulva and vagina: Secondary | ICD-10-CM

## 2022-02-08 DIAGNOSIS — R051 Acute cough: Secondary | ICD-10-CM

## 2022-02-08 DIAGNOSIS — S6991XA Unspecified injury of right wrist, hand and finger(s), initial encounter: Secondary | ICD-10-CM | POA: Diagnosis not present

## 2022-02-08 DIAGNOSIS — M79644 Pain in right finger(s): Secondary | ICD-10-CM

## 2022-02-08 MED ORDER — AMOXICILLIN-POT CLAVULANATE 875-125 MG PO TABS: 1.0000 | ORAL_TABLET | Freq: Two times a day (BID) | ORAL | 0 refills | Status: AC

## 2022-02-08 MED ORDER — TETANUS-DIPHTH-ACELL PERTUSSIS 5-2.5-18.5 LF-MCG/0.5 IM SUSY
PREFILLED_SYRINGE | INTRAMUSCULAR | Status: AC
  Filled 2022-02-08: qty 0.5

## 2022-02-08 MED ORDER — TETANUS-DIPHTH-ACELL PERTUSSIS 5-2.5-18.5 LF-MCG/0.5 IM SUSY
0.5000 mL | PREFILLED_SYRINGE | Freq: Once | INTRAMUSCULAR | Status: AC
  Administered 2022-02-08: 0.5 mL via INTRAMUSCULAR

## 2022-02-08 MED ORDER — BENZONATATE 100 MG PO CAPS: 100.0000 mg | ORAL_CAPSULE | Freq: Three times a day (TID) | ORAL | 0 refills | Status: DC

## 2022-02-08 MED ORDER — FLUCONAZOLE 150 MG PO TABS: 150.0000 mg | ORAL_TABLET | Freq: Every day | ORAL | 0 refills | Status: DC

## 2022-02-08 NOTE — ED Triage Notes (Addendum)
Right index finger injury occurred on Thursday.  Reports getting finger caught between a dogs chain and door.  Patient has multiple wounds to index finger.  Finger is swollen and skin is pale, has had a bandaid on wounds.  Patient heard crack.  Continues to have pain.    Unknown last tetanus  Patient also asking for medication for yeast infection

## 2022-02-08 NOTE — ED Provider Notes (Signed)
[place]    CSN: [id number] Arrival date & time: 02/08/22  1005      History   Chief Complaint No chief complaint on file.   HPI [name] is a 61 y.o. female.   Pt complains of right index finger pain and laceration after she got the finger stuck between her dogs chain leash and the door.  Reports wounds are healing well, but pain has not resolved.  She has ben taking tylenol as needed with temp relief. Denies drainage, redness, fever.  She also complains of dry cough.  Reports h/o asthma.  Has been using her nebulizer treatments at home with improvement.  Denies congestion, fever, body aches.   She also complains of increased vaginal discharge and itching.  Denies foul odor.  She has trid monistat at home with no relief.  Denies dysuria, abdominal pain, pelvic pain.     Past Medical History:  Diagnosis Date   Acute kidney failure with lesion of tubular necrosis (HCC) 11/11/2016   Anxiety    Back pain    Diabetes mellitus    Essential hypertension 11/11/2016   Fibromyalgia    High cholesterol    Hypertension    Syncope 11/11/2016    Patient Active Problem List   Diagnosis Date Noted   Gastroesophageal reflux disease 08/19/2021   Acute kidney injury superimposed on chronic kidney disease ([place]) 05/01/2021   Acute gastroenteritis 05/01/2021   Type II diabetes mellitus with peripheral autonomic neuropathy ([place]) 05/01/2021   Dyslipidemia 05/01/2021   Anxiety and depression 05/01/2021   AKI (acute kidney injury) ([place]) 04/30/2021   Seasonal and perennial allergic rhinoconjunctivitis 11/26/2020   Asthma-COPD overlap syndrome 09/12/2020   Tobacco user 09/12/2020   Other adverse food reactions, not elsewhere classified, subsequent encounter 09/12/2020   Upper airway cough syndrome 07/07/2020   Aortic atherosclerosis ([place]) 07/02/2020   Tobacco abuse counseling 07/02/2020   Coronary artery calcification seen on CT scan 07/02/2020   Atypical chest pain  07/02/2020   Family history of heart disease 07/02/2020   Pure hypercholesterolemia 07/02/2020   COPD 0 stage with emphysema  on CT/ actively smoking  05/25/2020   Cigarette smoker 05/25/2020   Screening breast examination 10/14/2018   Epistaxis 04/28/2018   Prediabetes 04/28/2018   Anxiety 07/31/2017   Low back pain 07/31/2017   Sinus tachycardia 07/31/2017   Dyspnea 07/20/2017   Hypomagnesemia 07/20/2017   Elevated lipase 07/20/2017   Elevated LFTs 07/20/2017   Alcohol abuse 07/20/2017   Dehydration 07/19/2017   Pancreatitis 07/19/2017   Syncope 11/11/2016   Essential hypertension 11/11/2016   Shoulder pain 08/26/2011   Fibromyalgia    LATERAL EPICONDYLITIS, BILATERAL 05/25/2008   STRESS FRACTURE, FOOT 05/25/2008    Past Surgical History:  Procedure Laterality Date   HAND SURGERY     HYSTEROSCOPY  11/07   D&C   OOPHORECTOMY     BSO   TUBAL LIGATION  1991   VAGINAL HYSTERECTOMY  01/2008   LAVH BSO    OB History     Gravida  6   Para  4   Term  4   Preterm  0   AB  2   Living         SAB  2   IAB  0   Ectopic  0   Multiple      Live Births               Home Medications    Prior to Admission  medications   Medication Sig Start Date End Date Taking? Authorizing Provider  amoxicillin-clavulanate (AUGMENTIN) 875-125 MG tablet Take 1 tablet by mouth 2 (two) times daily for 5 days. 02/08/22 02/13/22 Yes Ward, Lenise Arena, PA-C  benzonatate (TESSALON) 100 MG capsule Take 1 capsule (100 mg total) by mouth every 8 (eight) hours. 02/08/22  Yes Ward, Lenise Arena, PA-C  fluconazole (DIFLUCAN) 150 MG tablet Take 1 tablet (150 mg total) by mouth daily. Take 1 tablet by mouth, if no improvement after 72 hours may take the second tablet 02/08/22  Yes Ward, Lenise Arena, PA-C  acetaminophen (TYLENOL) 500 MG tablet Take 1,000 mg by mouth every 6 (six) hours as needed for moderate pain or headache.    [provider]  albuterol (PROVENTIL) (2.5 MG/3ML)  0.083% nebulizer solution INHALE THE CONTENTS OF ONE VIAL BY NEBULIZER EVERY 4 HOURS AS NEEDED FOR WHEEZING, SHORTNESS OF BREATH OR COUGHING FITS 01/27/22   Garnet Sierras, DO  alum & mag hydroxide-simeth (MAALOX/MYLANTA) 200-200-20 MG/5ML suspension Take 15 mLs by mouth every 4 (four) hours as needed for indigestion or heartburn. 07/23/17   Doreatha Lew, MD  aspirin 81 MG chewable tablet Chew 81 mg by mouth daily.    [provider]  blood glucose meter kit and supplies KIT Dispense based on patient and insurance preference. Use up to four times daily as directed. (FOR ICD-9 250.00, 250.01). 07/29/18   [name], [name]  Blood Pressure Monitoring (BLOOD PRESSURE CUFF) MISC Use to check blood pressure twice a day. 09/09/21   [name], [name]  budesonide (PULMICORT) 0.5 MG/2ML nebulizer solution Take 2 mLs (0.5 mg total) by nebulization in the morning and at bedtime. Use for 1-2 weeks during breathing flares. 11/20/21   Garnet Sierras, DO  busPIRone (BUSPAR) 10 MG tablet TAKE ONE TABLET BY MOUTH EVERY DAY 05/06/21   [name], [name]  CRESTOR 40 MG tablet TAKE ONE TABLET BY MOUTH EVERY DAY 01/29/22   [name], [name]  cyclobenzaprine (FLEXERIL) 10 MG tablet TAKE ONE TABLET BY MOUTH THREE TIMES A DAY AS NEEDED FOR MUSCLE SPASM 12/20/21   [name], [name]  DULoxetine (CYMBALTA) 60 MG capsule TAKE 1 CAPSULE BY MOUTH EVERY DAY 01/29/22   [name], [name]  EPIPEN 2-PAK 0.3 MG/0.3ML SOAJ injection INJECT 0.3MG (ONE INJECTION) INTRAMUSCULARLY AS DIRECTED AS NEEDED FOR ANAPHYLAXIS - (MAY REPEAT IN 5-15 MINUTES IF NEEDED; MORE THAN 2 DOSES SHOULD ONLY BE ADMINISTERED UNDER DIRECT MEDICAL SUPERVISION) 01/27/22   Garnet Sierras, DO  ezetimibe (ZETIA) 10 MG tablet TAKE ONE TABLET BY MOUTH EVERY DAY 01/29/22   [name], [name]  famotidine (PEPCID) 20 MG tablet Take 1 tablet (20 mg total) by mouth every evening. 08/19/21   Garnet Sierras, DO  Fluticasone-Umeclidin-Vilant (TRELEGY ELLIPTA) 200-62.5-25 MCG/ACT AEPB  Inhale 1 puff once a day and rinse mouth after each use. Patient not taking: Reported on 11/20/2021 08/19/21   Garnet Sierras, DO  gabapentin (NEURONTIN) 300 MG capsule TAKE ONE CAPSULE BY MOUTH TWICE A DAY 01/29/22   [name], [name]  JANUVIA 100 MG tablet TAKE ONE TABLET BY MOUTH EVERY DAY 01/29/22   [name], [name]  lidocaine (LIDODERM) 5 % Place 1 patch onto the skin daily as needed (back pain). Remove & Discard patch within 12 hours or as directed by MD 01/29/22   [name], [name]  loperamide (IMODIUM) 2 MG capsule Take 1 capsule (2 mg total) by mouth every 6 (six) hours as needed for diarrhea  or loose stools. 05/03/21   Terrilee Croak, MD  Magnesium 250 MG TABS Take 1 tablet (250 mg total) by mouth daily. Take with evening meal Patient taking differently: Take 250 mg by mouth at bedtime as needed (cramping). 05/03/20   [name], [name]  Melatonin 5 MG TABS Take 5 mg by mouth at bedtime.    [provider]  metFORMIN (GLUCOPHAGE-XR) 750 MG 24 hr tablet TAKE ONE TABLET BY MOUTH EVERY DAY WITH BREAKFAST 12/20/21   [name], [name]  montelukast (SINGULAIR) 10 MG tablet Take 1 tablet (10 mg total) by mouth at bedtime. 08/19/21   Garnet Sierras, DO  Multiple Vitamins-Minerals (ONE-A-DAY WOMENS PO) Take 1 tablet by mouth daily.    [provider]  olmesartan (BENICAR) 5 MG tablet TAKE ONE TABLET BY MOUTH EVERY DAY 01/29/22   [name], [name]  pantoprazole (PROTONIX) 40 MG tablet TAKE ONE TABLET BY MOUTH EVERY DAY 30 TO 60 MINUTES BEFORE FIRST MEAL OF THE DAY 01/29/22   [name], [name]  PROAIR HFA 108 [phone] Base) MCG/ACT inhaler INHALE TWO PUFFS BY MOUTH EVERY 4 HOURS AS NEEDED FOR WHEEZING OR SHORTNESS OF BREATH (COUGHING FITS) 01/27/22   Garnet Sierras, DO  Tetrahydrozoline HCl (VISINE OP) Place 1 drop into both eyes daily as needed (dry/itchy eyes).    [provider]    Family History Family History  Problem Relation Age of Onset   Allergic rhinitis Mother    Hypertension  Mother    Heart failure Mother    Allergic rhinitis Father    Diabetes Father    COPD Father    Emphysema Father    Heart failure Maternal Grandmother    Cancer Maternal Grandfather        LUNG    Heart disease Maternal Grandfather    Lung cancer Maternal Grandfather    Bronchitis Daughter        TWICE YEARLY   Allergic rhinitis Daughter    Bronchitis Son        YEARLY   Allergic rhinitis Son    Food Allergy Grandchild    Asthma Neg Hx    Immunodeficiency Neg Hx    Eczema Neg Hx    Atopy Neg Hx    Angioedema Neg Hx     Social History Social History   Tobacco Use   Smoking status: Every Day    Packs/day: 1.00    Years: 45.00    Total pack years: 45.00    Types: Cigarettes   Smokeless tobacco: Never   Tobacco comments:    1 pack/ day months, 01/23/22 - 1/2 PPD.   Vaping Use   Vaping Use: Never used  Substance Use Topics   Alcohol use: Yes    Alcohol/week: 0.0 standard drinks of alcohol    Comment: rare   Drug use: No     Allergies   Tylenol [acetaminophen], Pollen extract, Shellfish allergy, and Peanut-containing drug products   Review of Systems Review of Systems  Constitutional:  Negative for chills and fever.  HENT:  Negative for congestion, ear pain and sore throat.   Eyes:  Negative for pain and visual disturbance.  Respiratory:  Positive for cough. Negative for shortness of breath.   Cardiovascular:  Negative for chest pain and palpitations.  Gastrointestinal:  Negative for abdominal pain and vomiting.  Genitourinary:  Positive for vaginal discharge. Negative for dysuria and hematuria.  Musculoskeletal:  Positive for arthralgias. Negative for back pain.  Skin:  Negative for color  change and rash.  Neurological:  Negative for seizures and syncope.  All other systems reviewed and are negative.    Physical Exam Triage Vital Signs ED Triage Vitals  Enc Vitals Group     BP 02/08/22 1043 (!) 142/96     Pulse Rate 02/08/22 1043 86     Resp  02/08/22 1043 18     Temp 02/08/22 1043 97.9 F (36.6 C)     Temp Source 02/08/22 1043 Oral     SpO2 02/08/22 1043 99 %     Weight --      Height --      Head Circumference --      Peak Flow --      Pain Score 02/08/22 1039 9     Pain Loc --      Pain Edu? --      Excl. in [place]? --    No data found.  Updated Vital Signs BP (!) 142/96 (BP Location: Left Arm)   Pulse 86   Temp 97.9 F (36.6 C) (Oral)   Resp 18   SpO2 99%   Visual Acuity Right Eye Distance:   Left Eye Distance:   Bilateral Distance:    Right Eye Near:   Left Eye Near:    Bilateral Near:     Physical Exam Vitals and nursing note reviewed.  Constitutional:      General: She is not in acute distress.    Appearance: She is well-developed.  HENT:     Head: Normocephalic and atraumatic.  Eyes:     Conjunctiva/sclera: Conjunctivae normal.  Cardiovascular:     Rate and Rhythm: Normal rate and regular rhythm.     Heart sounds: No murmur heard. Pulmonary:     Effort: Pulmonary effort is normal. No respiratory distress.     Breath sounds: Normal breath sounds.  Abdominal:     Palpations: Abdomen is soft.     Tenderness: There is no abdominal tenderness.  Musculoskeletal:        General: No swelling.     Cervical back: Neck supple.     Comments: Right index finger with one superficial laceration that is healing well.  Mild swelling to DIP, decreased ROM due to pain. Normal cap refill.  Skin:    General: Skin is warm and dry.     Capillary Refill: Capillary refill takes less than 2 seconds.  Neurological:     Mental Status: She is alert.  Psychiatric:        Mood and Affect: Mood normal.      UC Treatments / Results  Labs (all labs ordered are listed, but only abnormal results are displayed) Labs Reviewed - No data to display  EKG   Radiology DG Finger Index Right  Result Date: 02/08/2022 CLINICAL DATA:  Trauma, pain and swelling EXAM: RIGHT INDEX FINGER 2+V COMPARISON:  None Available.  FINDINGS: There are small smooth marginated calcifications adjacent to the palmar aspect of base of middle phalanx and dorsal aspect of base of distal phalanx. Small smooth marginated calcification is noted adjacent to the interphalangeal joint of thumb. Bony spurs seen in first carpometacarpal and first metacarpophalangeal joints. IMPRESSION: No recent fracture or dislocation is seen. Small smooth marginated calcifications adjacent to the bases of middle and distal phalanges of right index finger may be residual from previous injury and degenerative arthritis. Degenerative changes are noted in multiple joints in the right thumb. Electronically Signed   By: Elmer Picker M.D.   On:  02/08/2022 11:14    Procedures Procedures (including critical care time)  Medications Ordered in UC Medications  Tdap (BOOSTRIX) injection 0.5 mL (has no administration in time range)    Initial Impression / Assessment and Plan / UC Course  I have reviewed the triage vital signs and the nursing notes.  Pertinent labs & imaging results that were available during my care of the patient were reviewed by me and considered in my medical decision making (see chart for details).    Keep wound clean and dry, may apply antibiotic ointment.  At this time wound is not infected.  Will update tetanus and start on antibiotics, x-rays negative.   Cough, pt reports improvement with neb treatments.  Pt stable, vitals wnl.  Denies congestion, fever, body aches.  Tessalon prescribed.   Diflucan prescribed for vaginal yeast infection.   Final Clinical Impressions(s) / UC Diagnoses   Final diagnoses:  Finger injury, right, initial encounter  Acute cough  Vaginal yeast infection     Discharge Instructions      Take antibiotic as prescribed.  Apply antibiotic ointment to finger. Can take tylenol as needed for pain. Return if you develop increased swelling, drainage, or pain return for recheck.  For cough, can take  tessalon as needed. Recommend drinking plenty of fluids.  Continue with neb treatment as needed.  Return if symptoms become worse.  For yeast infection take one tablet of diflucan by mouth.     ED Prescriptions     Medication Sig Dispense Auth. Provider   amoxicillin-clavulanate (AUGMENTIN) 875-125 MG tablet Take 1 tablet by mouth 2 (two) times daily for 5 days. 10 tablet Ward, Janett Billow Z, PA-C   benzonatate (TESSALON) 100 MG capsule Take 1 capsule (100 mg total) by mouth every 8 (eight) hours. 21 capsule Ward, Janett Billow Z, PA-C   fluconazole (DIFLUCAN) 150 MG tablet Take 1 tablet (150 mg total) by mouth daily. Take 1 tablet by mouth, if no improvement after 72 hours may take the second tablet 2 tablet Ward, Lenise Arena, PA-C      PDMP not reviewed this encounter.   Ward, Lenise Arena, PA-C 02/08/22 1140

## 2022-02-08 NOTE — Discharge Instructions (Addendum)
Take antibiotic as prescribed.  Apply antibiotic ointment to finger. Can take tylenol as needed for pain. Return if you develop increased swelling, drainage, or pain return for recheck.  For cough, can take tessalon as needed. Recommend drinking plenty of fluids.  Continue with neb treatment as needed.  Return if symptoms become worse.  For yeast infection take one tablet of diflucan by mouth.

## 2022-02-10 ENCOUNTER — Encounter (HOSPITAL_COMMUNITY)
Admission: RE | Admit: 2022-02-10 | Discharge: 2022-02-10 | Disposition: A | Discharge status: Home / Self Care | Source: Ambulatory Visit | Attending: Gastroenterology | Admitting: Gastroenterology

## 2022-02-10 ENCOUNTER — Ambulatory Visit (HOSPITAL_COMMUNITY)
Admission: RE | Admit: 2022-02-10 | Discharge: 2022-02-10 | Disposition: A | Discharge status: Home / Self Care | Source: Ambulatory Visit | Attending: Gastroenterology | Admitting: Gastroenterology

## 2022-02-10 ENCOUNTER — Encounter: Payer: Self-pay | Admitting: Nurse Practitioner

## 2022-02-10 DIAGNOSIS — R1011 Right upper quadrant pain: Secondary | ICD-10-CM | POA: Insufficient documentation

## 2022-02-10 MED ORDER — TECHNETIUM TC 99M MEBROFENIN IV KIT
5.3000 | PACK | Freq: Once | INTRAVENOUS | Status: AC | PRN
  Administered 2022-02-10: 5.3 via INTRAVENOUS

## 2022-02-19 ENCOUNTER — Other Ambulatory Visit: Payer: Self-pay | Admitting: Nurse Practitioner

## 2022-02-19 ENCOUNTER — Telehealth: Payer: Self-pay

## 2022-02-19 DIAGNOSIS — E119 Type 2 diabetes mellitus without complications: Secondary | ICD-10-CM

## 2022-02-19 NOTE — Progress Notes (Signed)
She needs to stop eating the pasta - try wheat noodles and cut back on the chocolate. She needs to get this under control as we should be starting insulin when the levels are this elevated. Have her to f/u in 12 weeks

## 2022-02-19 NOTE — Telephone Encounter (Signed)
Patient called, leaving vm in concerns to [facility] paperwork. Gave patient a call, unable to leave VM being it is not set up. FMLA paperwork completed, up front.

## 2022-02-21 ENCOUNTER — Ambulatory Visit (INDEPENDENT_AMBULATORY_CARE_PROVIDER_SITE_OTHER)

## 2022-02-21 ENCOUNTER — Ambulatory Visit: Payer: Self-pay

## 2022-02-21 DIAGNOSIS — J4540 Moderate persistent asthma, uncomplicated: Secondary | ICD-10-CM

## 2022-03-02 NOTE — Progress Notes (Unsigned)
Follow Up Note  RE: [name] MRN: [id number] DOB: 11/27/1960 Date of Office Visit: 03/03/2022  Referring provider: Arnette Felts, [name] Primary care provider: Arnette Felts, [name]  Chief Complaint: No chief complaint on file.  History of Present Illness: I had the pleasure of seeing [name] for a follow up visit at the [facility] on 03/02/2022. She is a 62 y.o. female, who is being followed for asthma/COPD on Xolair 375mg  every 2 weeks, allergic rhinoconjunctivitis, GERD and adverse food reaction. Her previous allergy office visit was on 11/20/2021 with Dr. 11/22/2021. Today is a regular follow up visit.  Asthma-COPD overlap syndrome (HCC) Past history - 30+ year smoking history and follows with pulmonology. Noted worsening respiratory symptoms the past 2 years. Currently on Stiolto 2 puffs once a day and using albuterol every other day with good benefit. Takes PPI for reflux. Eos 200, IgE 427. 2022 spirometry showed: restrictive disease with 11% improvement in FEV1 post bronchodilator treatment. Clinically feeling improved.  Interim history - still using albuterol daily, got confused about what inhalers/nebulizers to do. Still smokes. Increased coughing with discolored phlegm x 1-2 weeks now. Started Xolair 375mg  every 2 weeks in August 2023. Today's spirometry was unremarkable.  Possible infection Take zpak as directed. See below for symptomatic management. May take tessalon perles as needed for coughing.  May take honey at night to soothe the throat.  Try to decrease smoking - smoke 1 less cigarette per week. Daily controller medication(s): TAKE Trelegy 1 puff once a day and rinse mouth after each use.  Continue Xolair 375mg  every 2 weeks.  During upper respiratory infections/flares:  Start budesonide 0.5mg  nebulizer twice a day for 1-2 weeks until your breathing symptoms return to baseline.  Pretreat with albuterol 2 puffs or albuterol nebulizer.  If you  need to use your albuterol nebulizer machine back to back within 15-30 minutes with no relief then please go to the ER/urgent care for further evaluation.  May use albuterol rescue inhaler 2 puffs or nebulizer every 4 to 6 hours as needed for shortness of breath, chest tightness, coughing, and wheezing. May use albuterol rescue inhaler 2 puffs 5 to 15 minutes prior to strenuous physical activities. Monitor frequency of use.  Up to date with flu vaccine. Recommend RSV, pneumonia vaccine if not up to date. Declined Covid-19 booster. Get spirometry at next visit.   Seasonal and perennial allergic rhinoconjunctivitis Past history - Rhino conjunctivitis symptoms mainly in the spring and fall for 50+ years. On AIT briefly but stopped due to hives and tingling sensation in the face. 2022 skin testing showed: Positive to grass, ragweed, dust mites and feathers. Declines nasal sprays. Interim history - improved. Continue environmental control measures as below. Continue Singulair (montelukast) 10mg  daily at night. Use over the counter antihistamines such as Zyrtec (cetirizine), Claritin (loratadine), Allegra (fexofenadine), or Xyzal (levocetirizine) daily as needed. May take twice a day during allergy flares. May switch antihistamines every few months.   Gastroesophageal reflux disease Stable.  Continue lifestyle and dietary modifications. Continue Protonix 40mg  daily. Nothing to eat/drink for 30 minutes afterwards.  Continue famotidine 20mg  daily at night.   Other adverse food reactions, not elsewhere classified, subsequent encounter Past history - Shellfish caused tingling on her face, some finned fish cause diarrhea, peanuts cause tingling and hives, sesame seeds and poppy seeds cause hives. 2022 skin testing was negative to select foods. 2022 bloodwork was positive to shrimp only. Borderline positive to clam, scallop and  lobster. Negative to fish panel, tree nuts, peanuts, sesame seed and poppy seed.  Not interested in food challenges. Tolerates finned fish. Interim history - no reactions.  Continue to avoid shellfish, peanuts, sesame seeds, poppy seeds. For mild symptoms you can take over the counter antihistamines such as Benadryl and monitor symptoms closely. If symptoms worsen or if you have severe symptoms including breathing issues, throat closure, significant swelling, whole body hives, severe diarrhea and vomiting, lightheadedness then inject epinephrine and seek immediate medical care afterwards. Action plan in place.    Return in about 2 months (around 01/20/2022).  Assessment and Plan: [name] is a 62 y.o. female with: No problem-specific Assessment & Plan notes found for this encounter.  No follow-ups on file.  No orders of the defined types were placed in this encounter.  Lab Orders  No laboratory test(s) ordered today    Diagnostics: Spirometry:  Tracings reviewed. Her effort: {Blank single:19197::"Good reproducible efforts.","It was hard to get consistent efforts and there is a question as to whether this reflects a maximal maneuver.","Poor effort, data can not be interpreted."} FVC: ***L FEV1: ***L, ***% predicted FEV1/FVC ratio: ***% Interpretation: {Blank single:19197::"Spirometry consistent with mild obstructive disease","Spirometry consistent with moderate obstructive disease","Spirometry consistent with severe obstructive disease","Spirometry consistent with possible restrictive disease","Spirometry consistent with mixed obstructive and restrictive disease","Spirometry uninterpretable due to technique","Spirometry consistent with normal pattern","No overt abnormalities noted given today's efforts"}.  Please see scanned spirometry results for details.  Skin Testing: {Blank single:19197::"Select foods","Environmental allergy panel","Environmental allergy panel and select foods","Food allergy panel","None","Deferred due to recent antihistamines use"}. *** Results  discussed with patient/family.   Medication List:  Current Outpatient Medications  Medication Sig Dispense Refill   acetaminophen (TYLENOL) 500 MG tablet Take 1,000 mg by mouth every 6 (six) hours as needed for moderate pain or headache.     albuterol (PROVENTIL) (2.5 MG/3ML) 0.083% nebulizer solution INHALE THE CONTENTS OF ONE VIAL BY NEBULIZER EVERY 4 HOURS AS NEEDED FOR WHEEZING, SHORTNESS OF BREATH OR COUGHING FITS 75 mL 1   alum & mag hydroxide-simeth (MAALOX/MYLANTA) 200-200-20 MG/5ML suspension Take 15 mLs by mouth every 4 (four) hours as needed for indigestion or heartburn. 355 mL 0   aspirin 81 MG chewable tablet Chew 81 mg by mouth daily.     benzonatate (TESSALON) 100 MG capsule Take 1 capsule (100 mg total) by mouth every 8 (eight) hours. 21 capsule 0   blood glucose meter kit and supplies KIT Dispense based on patient and insurance preference. Use up to four times daily as directed. (FOR ICD-9 250.00, 250.01). 1 each 0   Blood Pressure Monitoring (BLOOD PRESSURE CUFF) MISC Use to check blood pressure twice a day. 1 each 1   budesonide (PULMICORT) 0.5 MG/2ML nebulizer solution Take 2 mLs (0.5 mg total) by nebulization in the morning and at bedtime. Use for 1-2 weeks during breathing flares. 120 mL 2   busPIRone (BUSPAR) 10 MG tablet TAKE ONE TABLET BY MOUTH EVERY DAY 30 tablet 11   CRESTOR 40 MG tablet TAKE ONE TABLET BY MOUTH EVERY DAY 90 tablet 1   cyclobenzaprine (FLEXERIL) 10 MG tablet TAKE ONE TABLET BY MOUTH THREE TIMES A DAY AS NEEDED FOR MUSCLE SPASM 30 tablet 2   DULoxetine (CYMBALTA) 60 MG capsule TAKE 1 CAPSULE BY MOUTH EVERY DAY 90 capsule 1   EPIPEN 2-PAK 0.3 MG/0.3ML SOAJ injection INJECT 0.3MG  (ONE INJECTION) INTRAMUSCULARLY AS DIRECTED AS NEEDED FOR ANAPHYLAXIS - (MAY REPEAT IN 5-15 MINUTES IF NEEDED; MORE THAN 2 DOSES SHOULD ONLY  BE ADMINISTERED UNDER DIRECT MEDICAL SUPERVISION) 2 each 2   ezetimibe (ZETIA) 10 MG tablet TAKE ONE TABLET BY MOUTH EVERY DAY 30 tablet 2    famotidine (PEPCID) 20 MG tablet Take 1 tablet (20 mg total) by mouth every evening. 90 tablet 2   fluconazole (DIFLUCAN) 150 MG tablet Take 1 tablet (150 mg total) by mouth daily. Take 1 tablet by mouth, if no improvement after 72 hours may take the second tablet 2 tablet 0   Fluticasone-Umeclidin-Vilant (TRELEGY ELLIPTA) 200-62.5-25 MCG/ACT AEPB Inhale 1 puff once a day and rinse mouth after each use. (Patient not taking: Reported on 11/20/2021) 180 each 2   gabapentin (NEURONTIN) 300 MG capsule TAKE ONE CAPSULE BY MOUTH TWICE A DAY 180 capsule 1   JANUVIA 100 MG tablet TAKE ONE TABLET BY MOUTH EVERY DAY 90 tablet 1   lidocaine (LIDODERM) 5 % Place 1 patch onto the skin daily as needed (back pain). Remove & Discard patch within 12 hours or as directed by MD 30 patch 1   loperamide (IMODIUM) 2 MG capsule Take 1 capsule (2 mg total) by mouth every 6 (six) hours as needed for diarrhea or loose stools. 30 capsule 0   Magnesium 250 MG TABS Take 1 tablet (250 mg total) by mouth daily. Take with evening meal (Patient taking differently: Take 250 mg by mouth at bedtime as needed (cramping).) 90 tablet 1   Melatonin 5 MG TABS Take 5 mg by mouth at bedtime.     metFORMIN (GLUCOPHAGE-XR) 750 MG 24 hr tablet TAKE ONE TABLET BY MOUTH EVERY DAY WITH BREAKFAST 90 tablet 1   montelukast (SINGULAIR) 10 MG tablet Take 1 tablet (10 mg total) by mouth at bedtime. 90 tablet 2   Multiple Vitamins-Minerals (ONE-A-DAY WOMENS PO) Take 1 tablet by mouth daily.     olmesartan (BENICAR) 5 MG tablet TAKE ONE TABLET BY MOUTH EVERY DAY 90 tablet 1   pantoprazole (PROTONIX) 40 MG tablet TAKE ONE TABLET BY MOUTH EVERY DAY 30 TO 60 MINUTES BEFORE FIRST MEAL OF THE DAY 90 tablet 1   PROAIR HFA 108 (90 Base) MCG/ACT inhaler INHALE TWO PUFFS BY MOUTH EVERY 4 HOURS AS NEEDED FOR WHEEZING OR SHORTNESS OF BREATH (COUGHING FITS) 1 each 1   Tetrahydrozoline HCl (VISINE OP) Place 1 drop into both eyes daily as needed (dry/itchy eyes).      Current Facility-Administered Medications  Medication Dose Route Frequency Provider Last Rate Last Admin   omalizumab Arvid Right) prefilled syringe 375 mg  375 mg Subcutaneous Q14 Days Garnet Sierras, DO   375 mg at 02/21/22 1517   Allergies: Allergies  Allergen Reactions   Tylenol [Acetaminophen] Itching    With codiene   Pollen Extract    Shellfish Allergy Hives   Peanut-Containing Drug Products Hives   I reviewed her past medical history, social history, family history, and environmental history and no significant changes have been reported from her previous visit.  Review of Systems  Constitutional:  Negative for appetite change, chills, fever and unexpected weight change.  HENT:  Negative for congestion, nosebleeds, rhinorrhea and sneezing.   Eyes:  Negative for itching.  Respiratory:  Positive for cough, chest tightness and shortness of breath. Negative for wheezing.   Cardiovascular:  Negative for chest pain.  Gastrointestinal:  Negative for abdominal pain.  Genitourinary:  Negative for difficulty urinating.  Skin:  Negative for rash.  Allergic/Immunologic: Positive for environmental allergies.    Objective: There were no vitals taken for this visit.  There is no height or weight on file to calculate BMI. Physical Exam Vitals and nursing note reviewed.  Constitutional:      Appearance: Normal appearance. She is well-developed.  HENT:     Head: Normocephalic and atraumatic.     Right Ear: External ear normal.     Left Ear: External ear normal.     Nose: Nose normal.     Mouth/Throat:     Mouth: Mucous membranes are moist.     Pharynx: Oropharynx is clear.  Eyes:     Conjunctiva/sclera: Conjunctivae normal.  Cardiovascular:     Rate and Rhythm: Normal rate and regular rhythm.     Heart sounds: Normal heart sounds. No murmur heard.    No friction rub. No gallop.  Pulmonary:     Effort: Pulmonary effort is normal.     Breath sounds: Normal breath sounds. No wheezing,  rhonchi or rales.  Abdominal:     Palpations: Abdomen is soft.  Musculoskeletal:     Cervical back: Neck supple.  Skin:    General: Skin is warm.     Findings: No rash.  Neurological:     Mental Status: She is alert and oriented to person, place, and time.  Psychiatric:        Behavior: Behavior normal.    Previous notes and tests were reviewed. The plan was reviewed with the patient/family, and all questions/concerned were addressed.  It was my pleasure to see Britzy today and participate in her care. Please feel free to contact me with any questions or concerns.  Sincerely,  Wyline Mood, DO Allergy & Immunology  [facility] office: [phone] [facility] office: [phone]

## 2022-03-03 ENCOUNTER — Ambulatory Visit (INDEPENDENT_AMBULATORY_CARE_PROVIDER_SITE_OTHER): Admitting: Allergy

## 2022-03-03 ENCOUNTER — Other Ambulatory Visit: Payer: Self-pay

## 2022-03-03 ENCOUNTER — Encounter: Payer: Self-pay | Admitting: Allergy

## 2022-03-03 VITALS — BP 132/72 | HR 84 | Temp 97.1°F | Resp 20 | Ht 60.0 in | Wt 136.2 lb

## 2022-03-03 DIAGNOSIS — J4489 Other specified chronic obstructive pulmonary disease: Secondary | ICD-10-CM | POA: Diagnosis not present

## 2022-03-03 DIAGNOSIS — K219 Gastro-esophageal reflux disease without esophagitis: Secondary | ICD-10-CM | POA: Diagnosis not present

## 2022-03-03 DIAGNOSIS — H1013 Acute atopic conjunctivitis, bilateral: Secondary | ICD-10-CM

## 2022-03-03 DIAGNOSIS — J302 Other seasonal allergic rhinitis: Secondary | ICD-10-CM | POA: Diagnosis not present

## 2022-03-03 DIAGNOSIS — T781XXD Other adverse food reactions, not elsewhere classified, subsequent encounter: Secondary | ICD-10-CM

## 2022-03-03 DIAGNOSIS — Z720 Tobacco use: Secondary | ICD-10-CM

## 2022-03-03 DIAGNOSIS — H1010 Acute atopic conjunctivitis, unspecified eye: Secondary | ICD-10-CM

## 2022-03-03 MED ORDER — MONTELUKAST SODIUM 10 MG PO TABS: 10.0000 mg | ORAL_TABLET | Freq: Every day | ORAL | 3 refills | Status: DC

## 2022-03-03 NOTE — Assessment & Plan Note (Signed)
Past history - Shellfish caused tingling on her face, some finned fish cause diarrhea, peanuts cause tingling and hives, sesame seeds and poppy seeds cause hives. 2022 skin testing was negative to select foods. 2022 bloodwork was positive to shrimp only. Borderline positive to clam, scallop and lobster. Negative to fish panel, tree nuts, peanuts, sesame seed and poppy seed. Not interested in food challenges. Tolerates finned fish. Interim history - no reactions.  Continue to avoid shellfish, peanuts, sesame seeds, poppy seeds. For mild symptoms you can take over the counter antihistamines such as Benadryl and monitor symptoms closely. If symptoms worsen or if you have severe symptoms including breathing issues, throat closure, significant swelling, whole body hives, severe diarrhea and vomiting, lightheadedness then inject epinephrine and seek immediate medical care afterwards. Action plan in place.

## 2022-03-03 NOTE — Patient Instructions (Addendum)
Breathing:  Try to decrease smoking - smoke 1 less cigarette per week. Daily controller medication(s): TAKE Trelegy 254mcg 1 puff once a day and rinse mouth after each use.  Continue Xolair 375mg  every 2 weeks - come later this week.  During respiratory infections/flares:  Start budesonide 0.5mg  nebulizer twice a day for 1-2 weeks until your breathing symptoms return to baseline.  Pretreat with albuterol 2 puffs or albuterol nebulizer.  If you need to use your albuterol nebulizer machine back to back within 15-30 minutes with no relief then please go to the ER/urgent care for further evaluation.  May use albuterol rescue inhaler 2 puffs or nebulizer every 4 to 6 hours as needed for shortness of breath, chest tightness, coughing, and wheezing. May use albuterol rescue inhaler 2 puffs 5 to 15 minutes prior to strenuous physical activities. Monitor frequency of use.  Breathing control goals:  Full participation in all desired activities (may need albuterol before activity) Albuterol use two times or less a week on average (not counting use with activity) Cough interfering with sleep two times or less a month Oral steroids no more than once a year No hospitalizations  Environmental allergies 2022 skin testing: Positive to grass, ragweed, dust mites and feathers.  Continue environmental control measures as below. Continue Singulair (montelukast) 10mg  daily at night. Use over the counter antihistamines such as Zyrtec (cetirizine), Claritin (loratadine), Allegra (fexofenadine), or Xyzal (levocetirizine) daily as needed. May take twice a day during allergy flares. May switch antihistamines every few months.  Food: Continue to avoid shellfish, peanuts, sesame seeds, poppy seeds. For mild symptoms you can take over the counter antihistamines such as Benadryl and monitor symptoms closely. If symptoms worsen or if you have severe symptoms including breathing issues, throat closure, significant swelling,  whole body hives, severe diarrhea and vomiting, lightheadedness then inject epinephrine and seek immediate medical care afterwards. Action plan in place.   Heartburn: Continue lifestyle and dietary modifications. Continue Protonix 40mg  daily as before. Nothing to eat/drink for 30 minutes afterwards.  Continue famotidine 20mg  daily at night as before.   Follow up in 3 months or sooner if needed.  Recommend RSV vaccine.

## 2022-03-03 NOTE — Assessment & Plan Note (Signed)
Past history - 30+ year smoking history and follows with pulmonology. Noted worsening respiratory symptoms the past 2 years. Currently on Stiolto 2 puffs once a day and using albuterol every other day with good benefit. Takes PPI for reflux. Eos 200, IgE 427. 2022 spirometry showed: restrictive disease with 11% improvement in FEV1 post bronchodilator treatment. Clinically feeling improved. Started Xolair 375mg  every 2 weeks in August 2023. Interim history - coughing less with Trelegy, notices symptoms if misses a dose. Still smoking 6-8 cigs/day.  Today's spirometry showed some restriction. Try to decrease smoking - smoke 1 less cigarette per week. Daily controller medication(s): TAKE Trelegy 219mcg 1 puff once a day and rinse mouth after each use.  Continue Xolair 375mg  every 2 weeks - come later this week.  During respiratory infections/flares:  Start budesonide 0.5mg  nebulizer twice a day for 1-2 weeks until your breathing symptoms return to baseline.  Pretreat with albuterol 2 puffs or albuterol nebulizer.  If you need to use your albuterol nebulizer machine back to back within 15-30 minutes with no relief then please go to the ER/urgent care for further evaluation.  May use albuterol rescue inhaler 2 puffs or nebulizer every 4 to 6 hours as needed for shortness of breath, chest tightness, coughing, and wheezing. May use albuterol rescue inhaler 2 puffs 5 to 15 minutes prior to strenuous physical activities. Monitor frequency of use.  Get spirometry at next visit.

## 2022-03-03 NOTE — Assessment & Plan Note (Signed)
Past history - Rhino conjunctivitis symptoms mainly in the spring and fall for 50+ years. On AIT briefly but stopped due to hives and tingling sensation in the face. 2022 skin testing showed: Positive to grass, ragweed, dust mites and feathers. Declines nasal sprays. Interim history - controlled with Singulair daily. Continue environmental control measures as below. Continue Singulair (montelukast) 10mg  daily at night. Use over the counter antihistamines such as Zyrtec (cetirizine), Claritin (loratadine), Allegra (fexofenadine), or Xyzal (levocetirizine) daily as needed. May take twice a day during allergy flares. May switch antihistamines every few months.

## 2022-03-03 NOTE — Assessment & Plan Note (Signed)
Controlled.  Continue lifestyle and dietary modifications. Continue Protonix 40mg  daily. Nothing to eat/drink for 30 minutes afterwards.  Continue famotidine 20mg  daily at night.

## 2022-03-06 ENCOUNTER — Ambulatory Visit
Admission: RE | Admit: 2022-03-06 | Discharge: 2022-03-06 | Disposition: A | Discharge status: Home / Self Care | Source: Ambulatory Visit | Attending: Nurse Practitioner | Admitting: Nurse Practitioner

## 2022-03-06 DIAGNOSIS — G4452 New daily persistent headache (NDPH): Secondary | ICD-10-CM

## 2022-03-06 DIAGNOSIS — Z720 Tobacco use: Secondary | ICD-10-CM

## 2022-03-07 ENCOUNTER — Ambulatory Visit (INDEPENDENT_AMBULATORY_CARE_PROVIDER_SITE_OTHER)

## 2022-03-07 DIAGNOSIS — J4540 Moderate persistent asthma, uncomplicated: Secondary | ICD-10-CM

## 2022-03-12 ENCOUNTER — Encounter: Payer: Self-pay | Admitting: Nurse Practitioner

## 2022-03-12 ENCOUNTER — Other Ambulatory Visit: Payer: Self-pay | Admitting: Nurse Practitioner

## 2022-03-12 DIAGNOSIS — R918 Other nonspecific abnormal finding of lung field: Secondary | ICD-10-CM

## 2022-03-21 ENCOUNTER — Encounter: Payer: Self-pay | Admitting: Nurse Practitioner

## 2022-03-21 ENCOUNTER — Ambulatory Visit (INDEPENDENT_AMBULATORY_CARE_PROVIDER_SITE_OTHER)

## 2022-03-21 DIAGNOSIS — J4540 Moderate persistent asthma, uncomplicated: Secondary | ICD-10-CM

## 2022-04-03 ENCOUNTER — Other Ambulatory Visit: Payer: Self-pay | Admitting: Allergy

## 2022-04-04 ENCOUNTER — Ambulatory Visit (INDEPENDENT_AMBULATORY_CARE_PROVIDER_SITE_OTHER)

## 2022-04-04 DIAGNOSIS — J4540 Moderate persistent asthma, uncomplicated: Secondary | ICD-10-CM | POA: Diagnosis not present

## 2022-04-21 ENCOUNTER — Ambulatory Visit (INDEPENDENT_AMBULATORY_CARE_PROVIDER_SITE_OTHER): Admitting: *Deleted

## 2022-04-21 DIAGNOSIS — J4540 Moderate persistent asthma, uncomplicated: Secondary | ICD-10-CM

## 2022-05-06 ENCOUNTER — Ambulatory Visit (INDEPENDENT_AMBULATORY_CARE_PROVIDER_SITE_OTHER)

## 2022-05-06 DIAGNOSIS — J4540 Moderate persistent asthma, uncomplicated: Secondary | ICD-10-CM

## 2022-05-14 ENCOUNTER — Other Ambulatory Visit: Payer: Self-pay | Admitting: Allergy

## 2022-05-14 ENCOUNTER — Other Ambulatory Visit: Payer: Self-pay | Admitting: Nurse Practitioner

## 2022-05-14 DIAGNOSIS — R252 Cramp and spasm: Secondary | ICD-10-CM

## 2022-05-21 ENCOUNTER — Ambulatory Visit (INDEPENDENT_AMBULATORY_CARE_PROVIDER_SITE_OTHER)

## 2022-05-21 DIAGNOSIS — J4540 Moderate persistent asthma, uncomplicated: Secondary | ICD-10-CM | POA: Diagnosis not present

## 2022-05-27 DIAGNOSIS — K5730 Diverticulosis of large intestine without perforation or abscess without bleeding: Secondary | ICD-10-CM | POA: Insufficient documentation

## 2022-05-27 DIAGNOSIS — R1033 Periumbilical pain: Secondary | ICD-10-CM

## 2022-05-27 DIAGNOSIS — K2100 Gastro-esophageal reflux disease with esophagitis, without bleeding: Secondary | ICD-10-CM | POA: Insufficient documentation

## 2022-05-27 DIAGNOSIS — K8681 Exocrine pancreatic insufficiency: Secondary | ICD-10-CM | POA: Insufficient documentation

## 2022-05-27 DIAGNOSIS — Z86010 Personal history of colonic polyps: Secondary | ICD-10-CM | POA: Insufficient documentation

## 2022-05-27 DIAGNOSIS — K700 Alcoholic fatty liver: Secondary | ICD-10-CM | POA: Insufficient documentation

## 2022-05-27 HISTORY — DX: Periumbilical pain: R10.33

## 2022-05-28 ENCOUNTER — Ambulatory Visit (INDEPENDENT_AMBULATORY_CARE_PROVIDER_SITE_OTHER): Admitting: Nurse Practitioner

## 2022-05-28 ENCOUNTER — Encounter: Payer: Self-pay | Admitting: Nurse Practitioner

## 2022-05-28 ENCOUNTER — Other Ambulatory Visit (HOSPITAL_COMMUNITY)
Admission: RE | Admit: 2022-05-28 | Discharge: 2022-05-28 | Disposition: A | Discharge status: Home / Self Care | Source: Ambulatory Visit | Attending: Nurse Practitioner | Admitting: Nurse Practitioner

## 2022-05-28 VITALS — BP 108/58 | HR 88 | Temp 98.3°F | Ht 60.0 in | Wt 132.0 lb

## 2022-05-28 DIAGNOSIS — N898 Other specified noninflammatory disorders of vagina: Secondary | ICD-10-CM | POA: Diagnosis not present

## 2022-05-28 DIAGNOSIS — R252 Cramp and spasm: Secondary | ICD-10-CM

## 2022-05-28 DIAGNOSIS — E1143 Type 2 diabetes mellitus with diabetic autonomic (poly)neuropathy: Secondary | ICD-10-CM | POA: Diagnosis not present

## 2022-05-28 DIAGNOSIS — R053 Chronic cough: Secondary | ICD-10-CM

## 2022-05-28 DIAGNOSIS — I10 Essential (primary) hypertension: Secondary | ICD-10-CM

## 2022-05-28 DIAGNOSIS — E782 Mixed hyperlipidemia: Secondary | ICD-10-CM

## 2022-05-28 DIAGNOSIS — Z79899 Other long term (current) drug therapy: Secondary | ICD-10-CM

## 2022-05-28 DIAGNOSIS — I700 Atherosclerosis of aorta: Secondary | ICD-10-CM

## 2022-05-28 DIAGNOSIS — M797 Fibromyalgia: Secondary | ICD-10-CM

## 2022-05-28 DIAGNOSIS — F419 Anxiety disorder, unspecified: Secondary | ICD-10-CM | POA: Diagnosis not present

## 2022-05-28 MED ORDER — CYCLOBENZAPRINE HCL 10 MG PO TABS: ORAL_TABLET | ORAL | 2 refills | Status: DC

## 2022-05-28 MED ORDER — EZETIMIBE 10 MG PO TABS: 10.0000 mg | ORAL_TABLET | Freq: Every day | ORAL | 2 refills | Status: DC

## 2022-05-28 MED ORDER — DULOXETINE HCL 60 MG PO CPEP: 60.0000 mg | ORAL_CAPSULE | Freq: Every day | ORAL | 1 refills | Status: DC

## 2022-05-28 MED ORDER — SITAGLIPTIN PHOSPHATE 100 MG PO TABS: 100.0000 mg | ORAL_TABLET | Freq: Every day | ORAL | 1 refills | Status: DC

## 2022-05-28 MED ORDER — METFORMIN HCL ER 750 MG PO TB24: ORAL_TABLET | ORAL | 1 refills | Status: DC

## 2022-05-28 MED ORDER — BENZONATATE 100 MG PO CAPS: 100.0000 mg | ORAL_CAPSULE | Freq: Three times a day (TID) | ORAL | 0 refills | Status: DC

## 2022-05-28 MED ORDER — OLMESARTAN MEDOXOMIL 5 MG PO TABS: 5.0000 mg | ORAL_TABLET | Freq: Every day | ORAL | 1 refills | Status: DC

## 2022-05-28 MED ORDER — GABAPENTIN 300 MG PO CAPS: 300.0000 mg | ORAL_CAPSULE | Freq: Two times a day (BID) | ORAL | 1 refills | Status: DC

## 2022-05-28 MED ORDER — PANTOPRAZOLE SODIUM 40 MG PO TBEC: DELAYED_RELEASE_TABLET | ORAL | 1 refills | Status: DC

## 2022-05-28 MED ORDER — BUSPIRONE HCL 10 MG PO TABS: 10.0000 mg | ORAL_TABLET | Freq: Every day | ORAL | 11 refills | Status: DC

## 2022-05-28 MED ORDER — FLUCONAZOLE 100 MG PO TABS: 100.0000 mg | ORAL_TABLET | Freq: Every day | ORAL | 0 refills | Status: AC

## 2022-05-28 MED ORDER — ROSUVASTATIN CALCIUM 40 MG PO TABS: 40.0000 mg | ORAL_TABLET | Freq: Every day | ORAL | 1 refills | Status: DC

## 2022-05-28 MED ORDER — BLOOD GLUCOSE MONITOR KIT: PACK | 0 refills | Status: DC

## 2022-05-28 NOTE — Patient Instructions (Signed)
Take tylenol 500mg  twice a day for 5 days and apply ice/heat for no more than 20 minutes at a time as needed If not better return call to office may need an xray.

## 2022-05-28 NOTE — Progress Notes (Signed)
I,Sheena H Holbrook,acting as a Education administrator for [name], [name].,have documented all relevant documentation on the behalf of [name], [name],as directed by  [name], [name] while in the presence of [name], [place].    Subjective:     Patient ID: [name] , female    DOB: 06/02/1960 , 62 y.o.   MRN: [id number]   Chief Complaint  Patient presents with   Hypertension   Cough   Vaginal Discharge    HPI  Patient presents today for hypertension follow up. Patient also reports pain to the left hip/leg for several days, denies fall/injury; states pain is worse at night.   She also states she broke her right index finger about 10 weeks ago, she did see ortho but lost her brace; she is wanting to know if she get another one. She caught her finger between 2 metal doors.   She also has symptoms of yeast infection for about a week, would like refill on Fluconazole, was last treated for this in December. She is also asking for refill on Tessalon perles, last received in December. Patient would also like refills on all her maintenance meds that are due.    Hypertension This is a chronic problem. The current episode started more than 1 year ago. The problem is unchanged. The problem is controlled. Pertinent negatives include no anxiety, chest pain or palpitations. There are no associated agents to hypertension. Risk factors for coronary artery disease include obesity and sedentary lifestyle. Past treatments include angiotensin blockers. There are no compliance problems.  There is no history of angina or kidney disease. There is no history of chronic renal disease.  Diabetes She presents for her follow-up diabetic visit. She has type 2 diabetes mellitus. Her disease course has been stable. Pertinent negatives for hypoglycemia include no dizziness. Pertinent negatives for diabetes include no chest pain, no polydipsia, no polyphagia and no polyuria. There are no hypoglycemic complications. There  are no diabetic complications. Risk factors for coronary artery disease include sedentary lifestyle, hypertension and diabetes mellitus. Current diabetic treatment includes oral agent (monotherapy). She is compliant with treatment all of the time. She has not had a previous visit with a dietitian. She rarely participates in exercise. An ACE inhibitor/angiotensin II receptor blocker is being taken. She does not see a podiatrist.Eye exam is not current.     Past Medical History:  Diagnosis Date   Acute kidney failure with lesion of tubular necrosis 11/11/2016   Anxiety    Back pain    Diabetes mellitus    Essential hypertension 11/11/2016   Fibromyalgia    High cholesterol    Hypertension    Syncope 11/11/2016     Family History  Problem Relation Age of Onset   Allergic rhinitis Mother    Hypertension Mother    Heart failure Mother    Allergic rhinitis Father    Diabetes Father    COPD Father    Emphysema Father    Heart failure Maternal Grandmother    Cancer Maternal Grandfather        LUNG    Heart disease Maternal Grandfather    Lung cancer Maternal Grandfather    Bronchitis Daughter        TWICE YEARLY   Allergic rhinitis Daughter    Bronchitis Son        YEARLY   Allergic rhinitis Son    Food Allergy Grandchild    Asthma Neg Hx    Immunodeficiency Neg Hx  Eczema Neg Hx    Atopy Neg Hx    Angioedema Neg Hx      Current Outpatient Medications:    acetaminophen (TYLENOL) 500 MG tablet, Take 1,000 mg by mouth every 6 (six) hours as needed for moderate pain or headache., Disp: , Rfl:    albuterol (PROVENTIL) (2.5 MG/3ML) 0.083% nebulizer solution, INHALE THE CONTENTS OF ONE VIAL BY NEBULIZER EVERY 4 HOURS AS NEEDED FOR WHEEZING, SHORTNESS OF BREATH OR COUGHING FITS, Disp: 75 mL, Rfl: 0   alum & mag hydroxide-simeth (MAALOX/MYLANTA) 200-200-20 MG/5ML suspension, Take 15 mLs by mouth every 4 (four) hours as needed for indigestion or heartburn., Disp: 355 mL, Rfl: 0    aspirin 81 MG chewable tablet, Chew 81 mg by mouth daily., Disp: , Rfl:    Blood Pressure Monitoring (BLOOD PRESSURE CUFF) MISC, Use to check blood pressure twice a day., Disp: 1 each, Rfl: 1   budesonide (PULMICORT) 0.5 MG/2ML nebulizer solution, INHALE THE CONTENTS OF ONE VIAL IN NEBULIZER TWICE A DAY IN THE MORNING AND AT BEDTIME FOR ONE OR TWO WEEKS DURING BREATHING FLARES, Disp: 300 mL, Rfl: 2   EPIPEN 2-PAK 0.3 MG/0.3ML SOAJ injection, INJECT 0.3MG  (ONE INJECTION) INTRAMUSCULARLY AS DIRECTED AS NEEDED FOR ANAPHYLAXIS - (MAY REPEAT IN 5-15 MINUTES IF NEEDED; MORE THAN 2 DOSES SHOULD ONLY BE ADMINISTERED UNDER DIRECT MEDICAL SUPERVISION), Disp: 2 each, Rfl: 2   famotidine (PEPCID) 20 MG tablet, Take 1 tablet (20 mg total) by mouth every evening., Disp: 90 tablet, Rfl: 2   fluconazole (DIFLUCAN) 100 MG tablet, Take 1 tablet (100 mg total) by mouth daily. Take 1 tablet by mouth now repeat in 5 days, Disp: 2 tablet, Rfl: 0   Fluticasone-Umeclidin-Vilant (TRELEGY ELLIPTA) 200-62.5-25 MCG/ACT AEPB, INHALE 1 PUFF BY MOUTH EVERY DAY - DISCARD DEVICE 6 WEEKS AFTER IT IS REMOVED FROM THE FOIL TRAY OR WHEN THE DOSE COUNTER READS &quot;0&quot; (WHICHEVER COMES FIRST), Disp: 60 each, Rfl: 5   lidocaine (LIDODERM) 5 %, Place 1 patch onto the skin daily as needed (back pain). Remove & Discard patch within 12 hours or as directed by MD, Disp: 30 patch, Rfl: 1   loperamide (IMODIUM) 2 MG capsule, Take 1 capsule (2 mg total) by mouth every 6 (six) hours as needed for diarrhea or loose stools., Disp: 30 capsule, Rfl: 0   Magnesium 250 MG TABS, Take 1 tablet (250 mg total) by mouth daily. Take with evening meal (Patient taking differently: Take 250 mg by mouth at bedtime as needed (cramping).), Disp: 90 tablet, Rfl: 1   Melatonin 5 MG TABS, Take 5 mg by mouth at bedtime., Disp: , Rfl:    montelukast (SINGULAIR) 10 MG tablet, Take 1 tablet (10 mg total) by mouth at bedtime., Disp: 90 tablet, Rfl: 3   Multiple  Vitamins-Minerals (ONE-A-DAY WOMENS PO), Take 1 tablet by mouth daily., Disp: , Rfl:    PROAIR HFA 108 (90 Base) MCG/ACT inhaler, INHALE TWO PUFFS BY MOUTH EVERY 4 HOURS AS NEEDED FOR WHEEZING OR SHORTNESS OF BREATH (COUGHING FITS), Disp: 1 each, Rfl: 1   Tetrahydrozoline HCl (VISINE OP), Place 1 drop into both eyes daily as needed (dry/itchy eyes)., Disp: , Rfl:    benzonatate (TESSALON) 100 MG capsule, Take 1 capsule (100 mg total) by mouth every 8 (eight) hours., Disp: 21 capsule, Rfl: 0   Blood Glucose Monitoring Suppl KIT, 1 each by Does not apply route 4 (four) times daily., Disp: 1 kit, Rfl: 0   busPIRone (BUSPAR) 10 MG tablet,  Take 1 tablet (10 mg total) by mouth daily., Disp: 30 tablet, Rfl: 11   cyclobenzaprine (FLEXERIL) 10 MG tablet, Take 1 tab by mouth three times a day as needed, Disp: 30 tablet, Rfl: 2   DULoxetine (CYMBALTA) 60 MG capsule, Take 1 capsule (60 mg total) by mouth daily., Disp: 90 capsule, Rfl: 1   ezetimibe (ZETIA) 10 MG tablet, Take 1 tablet (10 mg total) by mouth daily., Disp: 30 tablet, Rfl: 2   gabapentin (NEURONTIN) 300 MG capsule, Take 1 capsule (300 mg total) by mouth 2 (two) times daily., Disp: 180 capsule, Rfl: 1   metFORMIN (GLUCOPHAGE-XR) 750 MG 24 hr tablet, Take 1 tablet by mouth daily, Disp: 90 tablet, Rfl: 1   olmesartan (BENICAR) 5 MG tablet, Take 1 tablet (5 mg total) by mouth daily., Disp: 90 tablet, Rfl: 1   pantoprazole (PROTONIX) 40 MG tablet, TAKE ONE TABLET BY MOUTH EVERY DAY 30 TO 60 MINUTES BEFORE FIRST MEAL OF THE DAY, Disp: 90 tablet, Rfl: 1   rosuvastatin (CRESTOR) 40 MG tablet, Take 1 tablet (40 mg total) by mouth daily., Disp: 90 tablet, Rfl: 1   sitaGLIPtin (JANUVIA) 100 MG tablet, Take 1 tablet (100 mg total) by mouth daily., Disp: 90 tablet, Rfl: 1  Current Facility-Administered Medications:    omalizumab Geoffry Paradise) prefilled syringe 375 mg, 375 mg, Subcutaneous, Q14 Days, Ellamae Sia, DO, 375 mg at 05/21/22 0909   Allergies  Allergen  Reactions   Tylenol [Acetaminophen] Itching    With codiene   Pollen Extract    Shellfish Allergy Hives   Peanut-Containing Drug Products Hives     Review of Systems  Constitutional: Negative.   Respiratory: Negative.    Cardiovascular: Negative.  Negative for chest pain, palpitations and leg swelling.  Gastrointestinal: Negative.   Endocrine: Negative for polydipsia, polyphagia and polyuria.  Musculoskeletal:  Positive for arthralgias.       Left leg and hip pain   Neurological: Negative.  Negative for dizziness.  All other systems reviewed and are negative.    Today's Vitals   05/28/22 1037  BP: (!) 108/58  Pulse: 88  Temp: 98.3 F (36.8 C)  TempSrc: Oral  SpO2: 94%  Weight: 132 lb (59.9 kg)  Height: 5' (1.524 m)   Body mass index is 25.78 kg/m.   Objective:  Physical Exam Vitals reviewed.  Constitutional:      General: She is not in acute distress.    Appearance: Normal appearance.  Eyes:     Pupils: Pupils are equal, round, and reactive to light.  Cardiovascular:     Rate and Rhythm: Normal rate and regular rhythm.     Pulses: Normal pulses.     Heart sounds: Normal heart sounds. No murmur heard. Pulmonary:     Effort: Pulmonary effort is normal. No respiratory distress.     Breath sounds: Normal breath sounds. No wheezing.     Comments: Productive cough  Skin:    General: Skin is warm and dry.     Comments: Left hip tenderness  Neurological:     General: No focal deficit present.     Mental Status: She is alert and oriented to person, place, and time.     Cranial Nerves: No cranial nerve deficit.     Motor: No weakness.  Psychiatric:        Mood and Affect: Mood normal.        Behavior: Behavior normal.        Thought Content: Thought content  normal.        Judgment: Judgment normal.         Assessment And Plan:     1. Type II diabetes mellitus with peripheral autonomic neuropathy Comments: Hemoglobin A1c fairly controlled.  Encouraged to  continue taking medications as directed. - CMP14 + Anion Gap - Hemoglobin A1c - ezetimibe (ZETIA) 10 MG tablet; Take 1 tablet (10 mg total) by mouth daily.  Dispense: 30 tablet; Refill: 2  2. Essential hypertension Comments: Blood pressure is well-controlled.  Continue current medications. - CMP14 + Anion Gap  3. Anxiety Comments: This is currently stable.  4. Mixed hyperlipidemia Comments: Stable, continue statin, tolerating medications. - Lipid panel - ezetimibe (ZETIA) 10 MG tablet; Take 1 tablet (10 mg total) by mouth daily.  Dispense: 30 tablet; Refill: 2  5. Aortic atherosclerosis Comments: continue statin, tolerating medications - rosuvastatin (CRESTOR) 40 MG tablet; Take 1 tablet (40 mg total) by mouth daily.  Dispense: 90 tablet; Refill: 1 - Lipid panel  6. Fibromyalgia Comments: Stable, continue current medications  7. Other long term (current) drug therapy - TSH  8. Vaginal discharge Comments: Clear discharge leaving a "staining" on her underwear. Treated in December for yeast. Unsure if this is still yeast will check urine cytology and tx empircally - Urine cytology ancillary only - fluconazole (DIFLUCAN) 100 MG tablet; Take 1 tablet (100 mg total) by mouth daily. Take 1 tablet by mouth now repeat in 5 days  Dispense: 2 tablet; Refill: 0  9. Chronic cough Comments: Continue f/u with Pulmonology, advised to confirm if they want her to continue with tessalon perles as needed. Refill was sent - benzonatate (TESSALON) 100 MG capsule; Take 1 capsule (100 mg total) by mouth every 8 (eight) hours.  Dispense: 21 capsule; Refill: 0  10. Cramps, muscle, general Comments: Will provide muscle relaxer as needed     Patient was given opportunity to ask questions. Patient verbalized understanding of the plan and was able to repeat key elements of the plan. All questions were answered to their satisfaction.  Arnette Felts, [name]   I, Arnette Felts, [name], have reviewed all  documentation for this visit. The documentation on 05/28/22 for the exam, diagnosis, procedures, and orders are all accurate and complete.   IF YOU HAVE BEEN REFERRED TO A SPECIALIST, IT MAY TAKE 1-2 WEEKS TO SCHEDULE/PROCESS THE REFERRAL. IF YOU HAVE NOT HEARD FROM US/SPECIALIST IN TWO WEEKS, PLEASE GIVE [place] A CALL AT [phone].   THE PATIENT IS ENCOURAGED TO PRACTICE SOCIAL DISTANCING DUE TO THE COVID-19 PANDEMIC.

## 2022-05-29 LAB — LIPID PANEL
Chol/HDL Ratio: 2 ratio (ref 0.0–4.4)
Cholesterol, Total: 118 mg/dL (ref 100–199)
HDL: 58 mg/dL (ref >39)
LDL Chol Calc (NIH): 31 mg/dL (ref 0–99)
Triglycerides: 183 mg/dL — ABNORMAL HIGH (ref 0–149)
VLDL Cholesterol Cal: 29 mg/dL (ref 5–40)

## 2022-05-29 LAB — CMP14 + ANION GAP
ALT: 59 IU/L — ABNORMAL HIGH (ref 0–32)
AST: 49 IU/L — ABNORMAL HIGH (ref 0–40)
Albumin/Globulin Ratio: 1.4 (ref 1.2–2.2)
Albumin: 4.3 g/dL (ref 3.9–4.9)
Alkaline Phosphatase: 106 IU/L (ref 44–121)
Anion Gap: 17 mmol/L (ref 10.0–18.0)
BUN/Creatinine Ratio: 13 (ref 12–28)
BUN: 14 mg/dL (ref 8–27)
Bilirubin Total: <0.2 mg/dL (ref 0.0–1.2)
CO2: 20 mmol/L (ref 20–29)
Calcium: 9.5 mg/dL (ref 8.7–10.3)
Chloride: 99 mmol/L (ref 96–106)
Creatinine, Ser: 1.08 mg/dL — ABNORMAL HIGH (ref 0.57–1.00)
Globulin, Total: 3.1 g/dL (ref 1.5–4.5)
Glucose: 335 mg/dL — ABNORMAL HIGH (ref 70–99)
Potassium: 4.7 mmol/L (ref 3.5–5.2)
Sodium: 136 mmol/L (ref 134–144)
Total Protein: 7.4 g/dL (ref 6.0–8.5)
eGFR: 58 mL/min/{1.73_m2} — ABNORMAL LOW (ref >59)

## 2022-05-29 LAB — TSH: TSH: 1.12 u[IU]/mL (ref 0.450–4.500)

## 2022-05-29 LAB — HEMOGLOBIN A1C
Est. average glucose Bld gHb Est-mCnc: 309 mg/dL
Hgb A1c MFr Bld: 12.4 % — ABNORMAL HIGH (ref 4.8–5.6)

## 2022-05-30 LAB — URINE CYTOLOGY ANCILLARY ONLY
Bacterial Vaginitis-Urine: POSITIVE — AB
Candida Urine: NEGATIVE — AB
Candida Urine: POSITIVE — AB

## 2022-06-04 ENCOUNTER — Other Ambulatory Visit: Payer: Self-pay

## 2022-06-04 ENCOUNTER — Other Ambulatory Visit: Payer: Self-pay | Admitting: Nurse Practitioner

## 2022-06-04 DIAGNOSIS — I10 Essential (primary) hypertension: Secondary | ICD-10-CM

## 2022-06-04 DIAGNOSIS — E1143 Type 2 diabetes mellitus with diabetic autonomic (poly)neuropathy: Secondary | ICD-10-CM

## 2022-06-04 DIAGNOSIS — F419 Anxiety disorder, unspecified: Secondary | ICD-10-CM

## 2022-06-04 DIAGNOSIS — M797 Fibromyalgia: Secondary | ICD-10-CM

## 2022-06-04 DIAGNOSIS — R252 Cramp and spasm: Secondary | ICD-10-CM

## 2022-06-04 MED ORDER — DULOXETINE HCL 60 MG PO CPEP
60.0000 mg | ORAL_CAPSULE | Freq: Every day | ORAL | 1 refills | Status: DC
Start: 2022-06-04 — End: 2023-01-07

## 2022-06-04 MED ORDER — METFORMIN HCL ER 750 MG PO TB24
ORAL_TABLET | ORAL | 1 refills | Status: DC
Start: 2022-06-04 — End: 2023-01-07

## 2022-06-04 MED ORDER — BLOOD GLUCOSE MONITORING SUPPL KIT: 1.0000 | PACK | Freq: Four times a day (QID) | 0 refills | Status: AC

## 2022-06-04 MED ORDER — BLOOD GLUCOSE MONITOR KIT
1.0000 | PACK | Freq: Four times a day (QID) | 0 refills | Status: DC
Start: 2022-06-04 — End: 2022-06-04

## 2022-06-04 MED ORDER — OLMESARTAN MEDOXOMIL 5 MG PO TABS: 5.0000 mg | ORAL_TABLET | Freq: Every day | ORAL | 1 refills | Status: DC

## 2022-06-04 MED ORDER — BLOOD GLUCOSE MONITORING SUPPL KIT: 1.0000 | PACK | Freq: Four times a day (QID) | 0 refills | Status: DC

## 2022-06-04 MED ORDER — CYCLOBENZAPRINE HCL 10 MG PO TABS
ORAL_TABLET | ORAL | 2 refills | Status: DC
Start: 2022-06-04 — End: 2022-09-15

## 2022-06-04 MED ORDER — BUSPIRONE HCL 10 MG PO TABS: 10.0000 mg | ORAL_TABLET | Freq: Every day | ORAL | 11 refills | Status: DC

## 2022-06-04 MED ORDER — BLOOD GLUCOSE MONITOR KIT: PACK | 0 refills | Status: DC

## 2022-06-04 MED ORDER — GABAPENTIN 300 MG PO CAPS
300.0000 mg | ORAL_CAPSULE | Freq: Two times a day (BID) | ORAL | 1 refills | Status: DC
Start: 2022-06-04 — End: 2023-01-07

## 2022-06-04 MED ORDER — SITAGLIPTIN PHOSPHATE 100 MG PO TABS
100.0000 mg | ORAL_TABLET | Freq: Every day | ORAL | 1 refills | Status: DC
Start: 2022-06-04 — End: 2023-01-07

## 2022-06-05 ENCOUNTER — Other Ambulatory Visit: Payer: Self-pay | Admitting: Nurse Practitioner

## 2022-06-05 DIAGNOSIS — E1165 Type 2 diabetes mellitus with hyperglycemia: Secondary | ICD-10-CM

## 2022-06-05 MED ORDER — METRONIDAZOLE 500 MG PO TABS: 500.0000 mg | ORAL_TABLET | Freq: Three times a day (TID) | ORAL | 0 refills | Status: AC

## 2022-06-11 ENCOUNTER — Encounter: Payer: Self-pay | Admitting: Allergy

## 2022-06-11 ENCOUNTER — Other Ambulatory Visit: Payer: Self-pay

## 2022-06-11 ENCOUNTER — Ambulatory Visit

## 2022-06-11 ENCOUNTER — Ambulatory Visit (INDEPENDENT_AMBULATORY_CARE_PROVIDER_SITE_OTHER): Admitting: Allergy

## 2022-06-11 VITALS — BP 112/80 | HR 95 | Temp 97.8°F | Resp 12 | Wt 133.0 lb

## 2022-06-11 DIAGNOSIS — H1013 Acute atopic conjunctivitis, bilateral: Secondary | ICD-10-CM

## 2022-06-11 DIAGNOSIS — K219 Gastro-esophageal reflux disease without esophagitis: Secondary | ICD-10-CM

## 2022-06-11 DIAGNOSIS — T781XXD Other adverse food reactions, not elsewhere classified, subsequent encounter: Secondary | ICD-10-CM

## 2022-06-11 DIAGNOSIS — J302 Other seasonal allergic rhinitis: Secondary | ICD-10-CM

## 2022-06-11 DIAGNOSIS — J4540 Moderate persistent asthma, uncomplicated: Secondary | ICD-10-CM | POA: Diagnosis not present

## 2022-06-11 DIAGNOSIS — H1010 Acute atopic conjunctivitis, unspecified eye: Secondary | ICD-10-CM

## 2022-06-11 DIAGNOSIS — J4489 Other specified chronic obstructive pulmonary disease: Secondary | ICD-10-CM | POA: Diagnosis not present

## 2022-06-11 MED ORDER — TRELEGY ELLIPTA 200-62.5-25 MCG/ACT IN AEPB: 1.0000 | INHALATION_SPRAY | Freq: Every day | RESPIRATORY_TRACT | 3 refills | Status: DC

## 2022-06-11 MED ORDER — MONTELUKAST SODIUM 10 MG PO TABS: 10.0000 mg | ORAL_TABLET | Freq: Every day | ORAL | 3 refills | Status: DC

## 2022-06-11 MED ORDER — ALBUTEROL SULFATE HFA 108 (90 BASE) MCG/ACT IN AERS: 2.0000 | INHALATION_SPRAY | RESPIRATORY_TRACT | 1 refills | Status: DC | PRN

## 2022-06-11 NOTE — Patient Instructions (Addendum)
Breathing:  Try to decrease smoking - smoke 1 less cigarette per week. Daily controller medication(s): TAKE Trelegy 1 puff once a day and rinse mouth after each use.  Continue Xolair  every 2 weeks - given today. During respiratory infections/flares:  Start budesonide 0.5mg  nebulizer twice a day for 1-2 weeks until your breathing symptoms return to baseline.  Pretreat with albuterol 2 puffs or albuterol nebulizer.  If you need to use your albuterol nebulizer machine back to back within 15-30 minutes with no relief then please go to the ER/urgent care for further evaluation.  May use albuterol rescue inhaler 2 puffs or nebulizer every 4 to 6 hours as needed for shortness of breath, chest tightness, coughing, and wheezing. May use albuterol rescue inhaler 2 puffs 5 to 15 minutes prior to strenuous physical activities. Monitor frequency of use.  Breathing control goals:  Full participation in all desired activities (may need albuterol before activity) Albuterol use two times or less a week on average (not counting use with activity) Cough interfering with sleep two times or less a month Oral steroids no more than once a year No hospitalizations  Environmental allergies 2022 skin testing: Positive to grass, ragweed, dust mites and feathers.  Continue environmental control measures as below. Continue Singulair (montelukast)  daily at night. Use over the counter antihistamines such as Zyrtec (cetirizine), Claritin (loratadine), Allegra (fexofenadine), or Xyzal (levocetirizine) daily as needed. May take twice a day during allergy flares. May switch antihistamines every few months.  Food: Continue to avoid shellfish, peanuts, sesame seeds, poppy seeds. For mild symptoms you can take over the counter antihistamines such as Benadryl and monitor symptoms closely. If symptoms worsen or if you have severe symptoms including breathing issues, throat closure, significant swelling, whole body  hives, severe diarrhea and vomiting, lightheadedness then inject epinephrine and seek immediate medical care afterwards. Action plan in place.   Heartburn: Continue lifestyle and dietary modifications. Continue Protonix  daily in the morning. Nothing to eat/drink for 30 minutes afterwards.  Continue famotidine  daily at night.  Follow up in 4 months or sooner if needed.

## 2022-06-11 NOTE — Progress Notes (Signed)
Follow Up Note  RE: [name] MRN: [id number] DOB: Jul 01, 1960 Date of Office Visit: 06/11/2022  Referring provider: Arnette Felts, [name] Primary care provider: Arnette Felts, [name]  Chief Complaint: Follow-up  History of Present Illness: I had the pleasure of seeing [name] for a follow up visit at the [facility] on 06/11/2022. She is a 62 y.o. female, who is being followed for asthma/COPD on omalizumab 375mg  Q2 weeks, allergic rhinoconjunctivitis, GERD, adverse food reaction. Her previous allergy office visit was on 03/03/2022 with Dr. Selena Batten. Today is a regular follow up visit.  Asthma-COPD overlap syndrome Still smoking 1/2ppd at least.  Patient is currently trying to study criminology and is in school which causes stress.   Currently taking Trelegy 1 puff once a day, Xolair 375mg  every 2 weeks. Had to use budesonide/albuterol neb twice since last visit due to weather change.  Denies any ER/urgent care visits or prednisone use since the last visit.  Seasonal and perennial allergic rhinoconjunctivitis Controlled with Singulair daily.   Gastroesophageal reflux disease Controlled.    Food  No reactions.  Avoiding shellfish, peanuts, sesame seeds, poppy seeds.  Assessment and Plan: [name] is a 62 y.o. female with: Asthma-COPD overlap syndrome (HCC) Past history - 30+ year smoking history and follows with pulmonology. Noted worsening respiratory symptoms the past 2 years. Currently on Stiolto 2 puffs once a day and using albuterol every other day with good benefit. Takes PPI for reflux. Eos 200, IgE 427. 2022 spirometry showed: restrictive disease with 11% improvement in FEV1 post bronchodilator treatment. Clinically feeling improved. Started Xolair 375mg  every 2 weeks in August 2023. Interim history - still smoking, no prednisone.   Today's spirometry was normal.  Try to decrease smoking - smoke 1 less cigarette per week. Daily controller  medication(s): TAKE Trelegy 1 puff once a day and rinse mouth after each use.  Continue Xolair 375mg  every 2 weeks - given today. During respiratory infections/flares:  Start budesonide 0.5mg  nebulizer twice a day for 1-2 weeks until your breathing symptoms return to baseline.  Pretreat with albuterol 2 puffs or albuterol nebulizer.  If you need to use your albuterol nebulizer machine back to back within 15-30 minutes with no relief then please go to the ER/urgent care for further evaluation.  May use albuterol rescue inhaler 2 puffs or nebulizer every 4 to 6 hours as needed for shortness of breath, chest tightness, coughing, and wheezing. May use albuterol rescue inhaler 2 puffs 5 to 15 minutes prior to strenuous physical activities. Monitor frequency of use.  Get spirometry at next visit.  Seasonal and perennial allergic rhinoconjunctivitis Past history - Rhino conjunctivitis symptoms mainly in the spring and fall for 50+ years. On AIT briefly but stopped due to hives and tingling sensation in the face. 2022 skin testing showed: Positive to grass, ragweed, dust mites and feathers. Declines nasal sprays. Interim history - controlled with Singulair daily. Continue environmental control measures. Continue Singulair (montelukast) 10mg  daily at night. Use over the counter antihistamines such as Zyrtec (cetirizine), Claritin (loratadine), Allegra (fexofenadine), or Xyzal (levocetirizine) daily as needed. May take twice a day during allergy flares. May switch antihistamines every few months.  Gastroesophageal reflux disease Continue lifestyle and dietary modifications. Continue Protonix 40mg  daily in the morning. Nothing to eat/drink for 30 minutes afterwards.  Continue famotidine 20mg  daily at night.  Other adverse food reactions, not elsewhere classified, subsequent encounter Past history - Shellfish caused tingling on her face, some finned  fish cause diarrhea, peanuts cause tingling and  hives, sesame seeds and poppy seeds cause hives. 2022 skin testing was negative to select foods. 2022 bloodwork was positive to shrimp only. Borderline positive to clam, scallop and lobster. Negative to fish panel, tree nuts, peanuts, sesame seed and poppy seed. Not interested in food challenges. Tolerates finned fish. Continue to avoid shellfish, peanuts, sesame seeds, poppy seeds. For mild symptoms you can take over the counter antihistamines such as Benadryl and monitor symptoms closely. If symptoms worsen or if you have severe symptoms including breathing issues, throat closure, significant swelling, whole body hives, severe diarrhea and vomiting, lightheadedness then inject epinephrine and seek immediate medical care afterwards. Action plan in place.   Return in about 4 months (around 10/11/2022).  Meds ordered this encounter  Medications   albuterol (VENTOLIN HFA) 108 (90 Base) MCG/ACT inhaler    Sig: Inhale 2 puffs into the lungs every 4 (four) hours as needed for wheezing or shortness of breath (coughing fits).    Dispense:  18 g    Refill:  1   Fluticasone-Umeclidin-Vilant (TRELEGY ELLIPTA) 200-62.5-25 MCG/ACT AEPB    Sig: Inhale 1 puff into the lungs daily. Rinse mouth after each use.    Dispense:  180 each    Refill:  3    3 month supply at a time.   montelukast (SINGULAIR) 10 MG tablet    Sig: Take 1 tablet (10 mg total) by mouth at bedtime.    Dispense:  90 tablet    Refill:  3   Lab Orders  No laboratory test(s) ordered today    Diagnostics: Spirometry:  Tracings reviewed. Her effort: Good reproducible efforts. FVC: 1.88L FEV1: 1.37L, 75% predicted FEV1/FVC ratio: 73% Interpretation: Spirometry consistent with normal pattern.  Please see scanned spirometry results for details.   Medication List:  Current Outpatient Medications  Medication Sig Dispense Refill   acetaminophen (TYLENOL) 500 MG tablet Take 1,000 mg by mouth every 6 (six) hours as needed for moderate  pain or headache.     albuterol (PROVENTIL) (2.5 MG/3ML) 0.083% nebulizer solution INHALE THE CONTENTS OF ONE VIAL BY NEBULIZER EVERY 4 HOURS AS NEEDED FOR WHEEZING, SHORTNESS OF BREATH OR COUGHING FITS 75 mL 0   albuterol (VENTOLIN HFA) 108 (90 Base) MCG/ACT inhaler Inhale 2 puffs into the lungs every 4 (four) hours as needed for wheezing or shortness of breath (coughing fits). 18 g 1   alum & mag hydroxide-simeth (MAALOX/MYLANTA) 200-200-20 MG/5ML suspension Take 15 mLs by mouth every 4 (four) hours as needed for indigestion or heartburn. 355 mL 0   aspirin 81 MG chewable tablet Chew 81 mg by mouth daily.     benzonatate (TESSALON) 100 MG capsule Take 1 capsule (100 mg total) by mouth every 8 (eight) hours. 21 capsule 0   Blood Glucose Monitoring Suppl KIT 1 each by Does not apply route 4 (four) times daily. 1 kit 0   Blood Pressure Monitoring (BLOOD PRESSURE CUFF) MISC Use to check blood pressure twice a day. 1 each 1   budesonide (PULMICORT) 0.5 MG/2ML nebulizer solution INHALE THE CONTENTS OF ONE VIAL IN NEBULIZER TWICE A DAY IN THE MORNING AND AT BEDTIME FOR ONE OR TWO WEEKS DURING BREATHING FLARES 300 mL 2   busPIRone (BUSPAR) 10 MG tablet Take 1 tablet (10 mg total) by mouth daily. 30 tablet 11   cyclobenzaprine (FLEXERIL) 10 MG tablet Take 1 tab by mouth three times a day as needed 30 tablet 2   DULoxetine (  CYMBALTA) 60 MG capsule Take 1 capsule (60 mg total) by mouth daily. 90 capsule 1   EPIPEN 2-PAK 0.3 MG/0.3ML SOAJ injection INJECT 0.3MG  (ONE INJECTION) INTRAMUSCULARLY AS DIRECTED AS NEEDED FOR ANAPHYLAXIS - (MAY REPEAT IN 5-15 MINUTES IF NEEDED; MORE THAN 2 DOSES SHOULD ONLY BE ADMINISTERED UNDER DIRECT MEDICAL SUPERVISION) 2 each 2   ezetimibe (ZETIA) 10 MG tablet Take 1 tablet (10 mg total) by mouth daily. 30 tablet 2   famotidine (PEPCID) 20 MG tablet Take 1 tablet (20 mg total) by mouth every evening. 90 tablet 2   fluconazole (DIFLUCAN) 100 MG tablet Take 1 tablet (100 mg total) by  mouth daily. Take 1 tablet by mouth now repeat in 5 days 2 tablet 0   Fluticasone-Umeclidin-Vilant (TRELEGY ELLIPTA) 200-62.5-25 MCG/ACT AEPB Inhale 1 puff into the lungs daily. Rinse mouth after each use. 180 each 3   gabapentin (NEURONTIN) 300 MG capsule Take 1 capsule (300 mg total) by mouth 2 (two) times daily. 180 capsule 1   lidocaine (LIDODERM) 5 % Place 1 patch onto the skin daily as needed (back pain). Remove & Discard patch within 12 hours or as directed by MD 30 patch 1   loperamide (IMODIUM) 2 MG capsule Take 1 capsule (2 mg total) by mouth every 6 (six) hours as needed for diarrhea or loose stools. 30 capsule 0   Magnesium 250 MG TABS Take 1 tablet (250 mg total) by mouth daily. Take with evening meal (Patient taking differently: Take 250 mg by mouth at bedtime as needed (cramping).) 90 tablet 1   Melatonin 5 MG TABS Take 5 mg by mouth at bedtime.     metFORMIN (GLUCOPHAGE-XR) 750 MG 24 hr tablet Take 1 tablet by mouth daily 90 tablet 1   metroNIDAZOLE (FLAGYL) 500 MG tablet Take 1 tablet (500 mg total) by mouth 3 (three) times daily for 10 days. 30 tablet 0   Multiple Vitamins-Minerals (ONE-A-DAY WOMENS PO) Take 1 tablet by mouth daily.     olmesartan (BENICAR) 5 MG tablet Take 1 tablet (5 mg total) by mouth daily. 90 tablet 1   pantoprazole (PROTONIX) 40 MG tablet TAKE ONE TABLET BY MOUTH EVERY DAY 30 TO 60 MINUTES BEFORE FIRST MEAL OF THE DAY 90 tablet 1   rosuvastatin (CRESTOR) 40 MG tablet Take 1 tablet (40 mg total) by mouth daily. 90 tablet 1   sitaGLIPtin (JANUVIA) 100 MG tablet Take 1 tablet (100 mg total) by mouth daily. 90 tablet 1   Tetrahydrozoline HCl (VISINE OP) Place 1 drop into both eyes daily as needed (dry/itchy eyes).     montelukast (SINGULAIR) 10 MG tablet Take 1 tablet (10 mg total) by mouth at bedtime. 90 tablet 3   Current Facility-Administered Medications  Medication Dose Route Frequency Provider Last Rate Last Admin   omalizumab Geoffry Paradise) prefilled syringe  375 mg  375 mg Subcutaneous Q14 Days Ellamae Sia, DO   375 mg at 06/11/22 1610   Allergies: Allergies  Allergen Reactions   Tylenol [Acetaminophen] Itching    With codiene   Pollen Extract    Shellfish Allergy Hives   Peanut-Containing Drug Products Hives   I reviewed her past medical history, social history, family history, and environmental history and no significant changes have been reported from her previous visit.  Review of Systems  Constitutional:  Negative for appetite change, chills, fever and unexpected weight change.  HENT:  Negative for congestion, nosebleeds, rhinorrhea and sneezing.   Eyes:  Negative for itching.  Respiratory:  Negative for cough, chest tightness, shortness of breath and wheezing.   Cardiovascular:  Negative for chest pain.  Gastrointestinal:  Negative for abdominal pain.  Genitourinary:  Negative for difficulty urinating.  Skin:  Negative for rash.  Allergic/Immunologic: Positive for environmental allergies.    Objective: BP 112/80   Pulse 95   Temp 97.8 F (36.6 C) (Temporal)   Resp 12   Wt 133 lb (60.3 kg)   SpO2 94%   BMI 25.97 kg/m  Body mass index is 25.97 kg/m. Physical Exam Vitals and nursing note reviewed.  Constitutional:      Appearance: Normal appearance. She is well-developed.  HENT:     Head: Normocephalic and atraumatic.     Right Ear: Tympanic membrane and external ear normal.     Left Ear: Tympanic membrane and external ear normal.     Nose: Nose normal.     Mouth/Throat:     Mouth: Mucous membranes are dry.     Pharynx: Oropharynx is clear.  Eyes:     Conjunctiva/sclera: Conjunctivae normal.  Cardiovascular:     Rate and Rhythm: Normal rate and regular rhythm.     Heart sounds: Normal heart sounds. No murmur heard.    No friction rub. No gallop.  Pulmonary:     Effort: Pulmonary effort is normal.     Breath sounds: Normal breath sounds. No wheezing, rhonchi or rales.  Abdominal:     Palpations: Abdomen is  soft.  Musculoskeletal:     Cervical back: Neck supple.  Skin:    General: Skin is warm.     Findings: No rash.  Neurological:     Mental Status: She is alert and oriented to person, place, and time.  Psychiatric:        Behavior: Behavior normal.   Previous notes and tests were reviewed. The plan was reviewed with the patient/family, and all questions/concerned were addressed.  It was my pleasure to see Jase today and participate in her care. Please feel free to contact me with any questions or concerns.  Sincerely,  Wyline Mood, DO Allergy & Immunology  [facility] office: [phone] [facility] office: [phone]

## 2022-06-11 NOTE — Assessment & Plan Note (Signed)
Past history - Rhino conjunctivitis symptoms mainly in the spring and fall for 50+ years. On AIT briefly but stopped due to hives and tingling sensation in the face. 2022 skin testing showed: Positive to grass, ragweed, dust mites and feathers. Declines nasal sprays. Interim history - controlled with Singulair daily. Continue environmental control measures. Continue Singulair (montelukast)  daily at night. Use over the counter antihistamines such as Zyrtec (cetirizine), Claritin (loratadine), Allegra (fexofenadine), or Xyzal (levocetirizine) daily as needed. May take twice a day during allergy flares. May switch antihistamines every few months.

## 2022-06-11 NOTE — Assessment & Plan Note (Signed)
Past history - Shellfish caused tingling on her face, some finned fish cause diarrhea, peanuts cause tingling and hives, sesame seeds and poppy seeds cause hives. 2022 skin testing was negative to select foods. 2022 bloodwork was positive to shrimp only. Borderline positive to clam, scallop and lobster. Negative to fish panel, tree nuts, peanuts, sesame seed and poppy seed. Not interested in food challenges. Tolerates finned fish. Continue to avoid shellfish, peanuts, sesame seeds, poppy seeds. For mild symptoms you can take over the counter antihistamines such as Benadryl and monitor symptoms closely. If symptoms worsen or if you have severe symptoms including breathing issues, throat closure, significant swelling, whole body hives, severe diarrhea and vomiting, lightheadedness then inject epinephrine and seek immediate medical care afterwards. Action plan in place.

## 2022-06-11 NOTE — Assessment & Plan Note (Signed)
Continue lifestyle and dietary modifications. Continue Protonix  daily in the morning. Nothing to eat/drink for 30 minutes afterwards.  Continue famotidine  daily at night.

## 2022-06-11 NOTE — Assessment & Plan Note (Signed)
Past history - 30+ year smoking history and follows with pulmonology. Noted worsening respiratory symptoms the past 2 years. Currently on Stiolto 2 puffs once a day and using albuterol every other day with good benefit. Takes PPI for reflux. Eos 200, IgE 427. 2022 spirometry showed: restrictive disease with 11% improvement in FEV1 post bronchodilator treatment. Clinically feeling improved. Started Xolair  every 2 weeks in August 2023. Interim history - still smoking, no prednisone.   Today's spirometry was normal.  Try to decrease smoking - smoke 1 less cigarette per week. Daily controller medication(s): TAKE Trelegy 1 puff once a day and rinse mouth after each use.  Continue Xolair  every 2 weeks - given today. During respiratory infections/flares:  Start budesonide 0.5mg  nebulizer twice a day for 1-2 weeks until your breathing symptoms return to baseline.  Pretreat with albuterol 2 puffs or albuterol nebulizer.  If you need to use your albuterol nebulizer machine back to back within 15-30 minutes with no relief then please go to the ER/urgent care for further evaluation.  May use albuterol rescue inhaler 2 puffs or nebulizer every 4 to 6 hours as needed for shortness of breath, chest tightness, coughing, and wheezing. May use albuterol rescue inhaler 2 puffs 5 to 15 minutes prior to strenuous physical activities. Monitor frequency of use.  Get spirometry at next visit.

## 2022-06-25 ENCOUNTER — Ambulatory Visit (INDEPENDENT_AMBULATORY_CARE_PROVIDER_SITE_OTHER): Admitting: *Deleted

## 2022-06-25 DIAGNOSIS — J4540 Moderate persistent asthma, uncomplicated: Secondary | ICD-10-CM

## 2022-07-09 ENCOUNTER — Ambulatory Visit (INDEPENDENT_AMBULATORY_CARE_PROVIDER_SITE_OTHER): Admitting: *Deleted

## 2022-07-09 DIAGNOSIS — J4540 Moderate persistent asthma, uncomplicated: Secondary | ICD-10-CM

## 2022-07-28 ENCOUNTER — Ambulatory Visit (INDEPENDENT_AMBULATORY_CARE_PROVIDER_SITE_OTHER): Admitting: *Deleted

## 2022-07-28 DIAGNOSIS — J4540 Moderate persistent asthma, uncomplicated: Secondary | ICD-10-CM

## 2022-08-06 ENCOUNTER — Inpatient Hospital Stay: Admission: RE | Admit: 2022-08-06 | Source: Ambulatory Visit

## 2022-08-07 ENCOUNTER — Other Ambulatory Visit: Payer: Self-pay | Admitting: Allergy

## 2022-08-11 ENCOUNTER — Ambulatory Visit

## 2022-08-19 ENCOUNTER — Ambulatory Visit: Admitting: *Deleted

## 2022-09-09 ENCOUNTER — Other Ambulatory Visit: Payer: Self-pay | Admitting: Allergy

## 2022-09-11 ENCOUNTER — Ambulatory Visit (INDEPENDENT_AMBULATORY_CARE_PROVIDER_SITE_OTHER)

## 2022-09-11 DIAGNOSIS — J4540 Moderate persistent asthma, uncomplicated: Secondary | ICD-10-CM

## 2022-09-15 ENCOUNTER — Other Ambulatory Visit: Payer: Self-pay | Admitting: Nurse Practitioner

## 2022-09-15 DIAGNOSIS — R252 Cramp and spasm: Secondary | ICD-10-CM

## 2022-09-25 ENCOUNTER — Ambulatory Visit (INDEPENDENT_AMBULATORY_CARE_PROVIDER_SITE_OTHER)

## 2022-09-25 DIAGNOSIS — J4540 Moderate persistent asthma, uncomplicated: Secondary | ICD-10-CM | POA: Diagnosis not present

## 2022-10-07 NOTE — Progress Notes (Unsigned)
Follow Up Note  RE: [name] MRN: [id number] DOB: 1961-02-08 Date of Office Visit: 10/08/2022  Referring provider: Arnette Felts, [name] Primary care provider: Arnette Felts, [name]  Chief Complaint: No chief complaint on file.  History of Present Illness: I had the pleasure of seeing [name] for a follow up visit at the [facility] on 10/07/2022. She is a 62 y.o. female, who is being followed for asthma/COPD on Xolair, allergic rhinoconjunctivitis, GERD, adverse food reaction. Her previous allergy office visit was on 06/11/2022 with Dr. Selena Batten. Today is a regular follow up visit.  sthma-COPD overlap syndrome (HCC) Past history - 30+ year smoking history and follows with pulmonology. Noted worsening respiratory symptoms the past 2 years. Currently on Stiolto 2 puffs once a day and using albuterol every other day with good benefit. Takes PPI for reflux. Eos 200, IgE 427. 2022 spirometry showed: restrictive disease with 11% improvement in FEV1 post bronchodilator treatment. Clinically feeling improved. Started Xolair 375mg  every 2 weeks in August 2023. Interim history - still smoking, no prednisone.   Today's spirometry was normal.  Try to decrease smoking - smoke 1 less cigarette per week. Daily controller medication(s): TAKE Trelegy 1 puff once a day and rinse mouth after each use.  Continue Xolair 375mg  every 2 weeks - given today. During respiratory infections/flares:  Start budesonide 0.5mg  nebulizer twice a day for 1-2 weeks until your breathing symptoms return to baseline.  Pretreat with albuterol 2 puffs or albuterol nebulizer.  If you need to use your albuterol nebulizer machine back to back within 15-30 minutes with no relief then please go to the ER/urgent care for further evaluation.  May use albuterol rescue inhaler 2 puffs or nebulizer every 4 to 6 hours as needed for shortness of breath, chest tightness, coughing, and wheezing. May use albuterol  rescue inhaler 2 puffs 5 to 15 minutes prior to strenuous physical activities. Monitor frequency of use.  Get spirometry at next visit.   Seasonal and perennial allergic rhinoconjunctivitis Past history - Rhino conjunctivitis symptoms mainly in the spring and fall for 50+ years. On AIT briefly but stopped due to hives and tingling sensation in the face. 2022 skin testing showed: Positive to grass, ragweed, dust mites and feathers. Declines nasal sprays. Interim history - controlled with Singulair daily. Continue environmental control measures. Continue Singulair (montelukast) 10mg  daily at night. Use over the counter antihistamines such as Zyrtec (cetirizine), Claritin (loratadine), Allegra (fexofenadine), or Xyzal (levocetirizine) daily as needed. May take twice a day during allergy flares. May switch antihistamines every few months.   Gastroesophageal reflux disease Continue lifestyle and dietary modifications. Continue Protonix 40mg  daily in the morning. Nothing to eat/drink for 30 minutes afterwards.  Continue famotidine 20mg  daily at night.   Other adverse food reactions, not elsewhere classified, subsequent encounter Past history - Shellfish caused tingling on her face, some finned fish cause diarrhea, peanuts cause tingling and hives, sesame seeds and poppy seeds cause hives. 2022 skin testing was negative to select foods. 2022 bloodwork was positive to shrimp only. Borderline positive to clam, scallop and lobster. Negative to fish panel, tree nuts, peanuts, sesame seed and poppy seed. Not interested in food challenges. Tolerates finned fish. Continue to avoid shellfish, peanuts, sesame seeds, poppy seeds. For mild symptoms you can take over the counter antihistamines such as Benadryl and monitor symptoms closely. If symptoms worsen or if you have severe symptoms including breathing issues, throat closure, significant swelling, whole body hives, severe  diarrhea and vomiting, lightheadedness  then inject epinephrine and seek immediate medical care afterwards. Action plan in place.    Return in about 4 months (around 10/11/2022).  Assessment and Plan: [name] is a 62 y.o. female with: ***  No follow-ups on file.  No orders of the defined types were placed in this encounter.  Lab Orders  No laboratory test(s) ordered today    Diagnostics: Spirometry:  Tracings reviewed. Her effort: {Blank single:19197::"Good reproducible efforts.","It was hard to get consistent efforts and there is a question as to whether this reflects a maximal maneuver.","Poor effort, data can not be interpreted."} FVC: ***L FEV1: ***L, ***% predicted FEV1/FVC ratio: ***% Interpretation: {Blank single:19197::"Spirometry consistent with mild obstructive disease","Spirometry consistent with moderate obstructive disease","Spirometry consistent with severe obstructive disease","Spirometry consistent with possible restrictive disease","Spirometry consistent with mixed obstructive and restrictive disease","Spirometry uninterpretable due to technique","Spirometry consistent with normal pattern","No overt abnormalities noted given today's efforts"}.  Please see scanned spirometry results for details.  Skin Testing: {Blank single:19197::"Select foods","Environmental allergy panel","Environmental allergy panel and select foods","Food allergy panel","None","Deferred due to recent antihistamines use"}. *** Results discussed with patient/family.   Medication List:  Current Outpatient Medications  Medication Sig Dispense Refill   acetaminophen (TYLENOL) 500 MG tablet Take 1,000 mg by mouth every 6 (six) hours as needed for moderate pain or headache.     albuterol (PROVENTIL) (2.5 MG/3ML) 0.083% nebulizer solution INHALE THE CONTENTS OF ONE VIAL BY NEBULIZER EVERY 4 HOURS AS NEEDED FOR WHEEZING, SHORTNESS OF BREATH OR COUGHING FITS 75 mL 1   alum & mag hydroxide-simeth (MAALOX/MYLANTA) 200-200-20 MG/5ML suspension Take  15 mLs by mouth every 4 (four) hours as needed for indigestion or heartburn. 355 mL 0   aspirin 81 MG chewable tablet Chew 81 mg by mouth daily.     benzonatate (TESSALON) 100 MG capsule Take 1 capsule (100 mg total) by mouth every 8 (eight) hours. 21 capsule 0   Blood Glucose Monitoring Suppl KIT 1 each by Does not apply route 4 (four) times daily. 1 kit 0   Blood Pressure Monitoring (BLOOD PRESSURE CUFF) MISC Use to check blood pressure twice a day. 1 each 1   budesonide (PULMICORT) 0.5 MG/2ML nebulizer solution INHALE THE CONTENTS OF ONE VIAL IN NEBULIZER TWICE A DAY IN THE MORNING AND AT BEDTIME FOR ONE OR TWO WEEKS DURING BREATHING FLARES 300 mL 2   busPIRone (BUSPAR) 10 MG tablet Take 1 tablet (10 mg total) by mouth daily. 30 tablet 11   cyclobenzaprine (FLEXERIL) 10 MG tablet TAKE ONE TABLET BY MOUTH THREE TIMES A DAY AS NEEDED 30 tablet 2   DULoxetine (CYMBALTA) 60 MG capsule Take 1 capsule (60 mg total) by mouth daily. 90 capsule 1   EPIPEN 2-PAK 0.3 MG/0.3ML SOAJ injection INJECT 0.3MG  (ONE INJECTION) INTRAMUSCULARLY AS DIRECTED AS NEEDED FOR ANAPHYLAXIS - (MAY REPEAT IN 5-15 MINUTES IF NEEDED; MORE THAN 2 DOSES SHOULD ONLY BE ADMINISTERED UNDER DIRECT MEDICAL SUPERVISION) 2 each 2   ezetimibe (ZETIA) 10 MG tablet Take 1 tablet (10 mg total) by mouth daily. 30 tablet 2   famotidine (PEPCID) 20 MG tablet TAKE ONE TABLET BY MOUTH EVERY DAY IN THE EVENING 90 tablet 0   fluconazole (DIFLUCAN) 100 MG tablet Take 1 tablet (100 mg total) by mouth daily. Take 1 tablet by mouth now repeat in 5 days 2 tablet 0   Fluticasone-Umeclidin-Vilant (TRELEGY ELLIPTA) 200-62.5-25 MCG/ACT AEPB Inhale 1 puff into the lungs daily. Rinse mouth after each use. 180 each 3   gabapentin (  NEURONTIN) 300 MG capsule Take 1 capsule (300 mg total) by mouth 2 (two) times daily. 180 capsule 1   lidocaine (LIDODERM) 5 % Place 1 patch onto the skin daily as needed (back pain). Remove & Discard patch within 12 hours or as  directed by MD 30 patch 1   loperamide (IMODIUM) 2 MG capsule Take 1 capsule (2 mg total) by mouth every 6 (six) hours as needed for diarrhea or loose stools. 30 capsule 0   Magnesium 250 MG TABS Take 1 tablet (250 mg total) by mouth daily. Take with evening meal (Patient taking differently: Take 250 mg by mouth at bedtime as needed (cramping).) 90 tablet 1   Melatonin 5 MG TABS Take 5 mg by mouth at bedtime.     metFORMIN (GLUCOPHAGE-XR) 750 MG 24 hr tablet Take 1 tablet by mouth daily 90 tablet 1   montelukast (SINGULAIR) 10 MG tablet Take 1 tablet (10 mg total) by mouth at bedtime. 90 tablet 3   Multiple Vitamins-Minerals (ONE-A-DAY WOMENS PO) Take 1 tablet by mouth daily.     olmesartan (BENICAR) 5 MG tablet Take 1 tablet (5 mg total) by mouth daily. 90 tablet 1   pantoprazole (PROTONIX) 40 MG tablet TAKE ONE TABLET BY MOUTH EVERY DAY 30 TO 60 MINUTES BEFORE FIRST MEAL OF THE DAY 90 tablet 1   PROAIR HFA 108 (90 Base) MCG/ACT inhaler INHALE TWO PUFFS BY MOUTH EVERY 4 HOURS AS NEEDED FOR WHEEZING OR SHORTNESS OF BREATH (COUGHING FITS) 1 each 1   rosuvastatin (CRESTOR) 40 MG tablet Take 1 tablet (40 mg total) by mouth daily. 90 tablet 1   sitaGLIPtin (JANUVIA) 100 MG tablet Take 1 tablet (100 mg total) by mouth daily. 90 tablet 1   Tetrahydrozoline HCl (VISINE OP) Place 1 drop into both eyes daily as needed (dry/itchy eyes).     Current Facility-Administered Medications  Medication Dose Route Frequency Provider Last Rate Last Admin   omalizumab Geoffry Paradise) prefilled syringe 375 mg  375 mg Subcutaneous Q14 Days Ellamae Sia, DO   375 mg at 09/25/22 4098   Allergies: Allergies  Allergen Reactions   Tylenol [Acetaminophen] Itching    With codiene   Pollen Extract    Shellfish Allergy Hives   Peanut-Containing Drug Products Hives   I reviewed her past medical history, social history, family history, and environmental history and no significant changes have been reported from her previous  visit.  Review of Systems  Constitutional:  Negative for appetite change, chills, fever and unexpected weight change.  HENT:  Negative for congestion, nosebleeds, rhinorrhea and sneezing.   Eyes:  Negative for itching.  Respiratory:  Negative for cough, chest tightness, shortness of breath and wheezing.   Cardiovascular:  Negative for chest pain.  Gastrointestinal:  Negative for abdominal pain.  Genitourinary:  Negative for difficulty urinating.  Skin:  Negative for rash.  Allergic/Immunologic: Positive for environmental allergies.    Objective: There were no vitals taken for this visit. There is no height or weight on file to calculate BMI. Physical Exam Vitals and nursing note reviewed.  Constitutional:      Appearance: Normal appearance. She is well-developed.  HENT:     Head: Normocephalic and atraumatic.     Right Ear: Tympanic membrane and external ear normal.     Left Ear: Tympanic membrane and external ear normal.     Nose: Nose normal.     Mouth/Throat:     Mouth: Mucous membranes are dry.  Pharynx: Oropharynx is clear.  Eyes:     Conjunctiva/sclera: Conjunctivae normal.  Cardiovascular:     Rate and Rhythm: Normal rate and regular rhythm.     Heart sounds: Normal heart sounds. No murmur heard.    No friction rub. No gallop.  Pulmonary:     Effort: Pulmonary effort is normal.     Breath sounds: Normal breath sounds. No wheezing, rhonchi or rales.  Abdominal:     Palpations: Abdomen is soft.  Musculoskeletal:     Cervical back: Neck supple.  Skin:    General: Skin is warm.     Findings: No rash.  Neurological:     Mental Status: She is alert and oriented to person, place, and time.  Psychiatric:        Behavior: Behavior normal.    Previous notes and tests were reviewed. The plan was reviewed with the patient/family, and all questions/concerned were addressed.  It was my pleasure to see Adamariz today and participate in her care. Please feel free to  contact me with any questions or concerns.  Sincerely,  Wyline Mood, DO Allergy & Immunology  [facility] office: [phone] [facility] office: [phone]

## 2022-10-08 ENCOUNTER — Ambulatory Visit (INDEPENDENT_AMBULATORY_CARE_PROVIDER_SITE_OTHER): Admitting: Allergy

## 2022-10-08 ENCOUNTER — Ambulatory Visit

## 2022-10-08 ENCOUNTER — Encounter: Payer: Self-pay | Admitting: Allergy

## 2022-10-08 ENCOUNTER — Telehealth: Payer: Self-pay | Admitting: *Deleted

## 2022-10-08 VITALS — BP 120/80 | HR 91 | Temp 97.9°F | Resp 16 | Wt 140.9 lb

## 2022-10-08 DIAGNOSIS — H1010 Acute atopic conjunctivitis, unspecified eye: Secondary | ICD-10-CM

## 2022-10-08 DIAGNOSIS — T781XXD Other adverse food reactions, not elsewhere classified, subsequent encounter: Secondary | ICD-10-CM

## 2022-10-08 DIAGNOSIS — Z720 Tobacco use: Secondary | ICD-10-CM

## 2022-10-08 DIAGNOSIS — J3089 Other allergic rhinitis: Secondary | ICD-10-CM

## 2022-10-08 DIAGNOSIS — J4489 Other specified chronic obstructive pulmonary disease: Secondary | ICD-10-CM | POA: Diagnosis not present

## 2022-10-08 DIAGNOSIS — J302 Other seasonal allergic rhinitis: Secondary | ICD-10-CM | POA: Diagnosis not present

## 2022-10-08 DIAGNOSIS — J4540 Moderate persistent asthma, uncomplicated: Secondary | ICD-10-CM | POA: Diagnosis not present

## 2022-10-08 DIAGNOSIS — K219 Gastro-esophageal reflux disease without esophagitis: Secondary | ICD-10-CM

## 2022-10-08 DIAGNOSIS — J301 Allergic rhinitis due to pollen: Secondary | ICD-10-CM

## 2022-10-08 DIAGNOSIS — H1013 Acute atopic conjunctivitis, bilateral: Secondary | ICD-10-CM

## 2022-10-08 MED ORDER — BREZTRI AEROSPHERE 160-9-4.8 MCG/ACT IN AERO: 2.0000 | INHALATION_SPRAY | Freq: Two times a day (BID) | RESPIRATORY_TRACT | 5 refills | Status: DC

## 2022-10-08 MED ORDER — MONTELUKAST SODIUM 10 MG PO TABS: 10.0000 mg | ORAL_TABLET | Freq: Every day | ORAL | 3 refills | Status: DC

## 2022-10-08 MED ORDER — TEZSPIRE 210 MG/1.91ML ~~LOC~~ SOAJ: 210.0000 mg | SUBCUTANEOUS | 3 refills | Status: DC

## 2022-10-08 NOTE — Telephone Encounter (Signed)
-----   Message from Ellamae Sia sent at 10/08/2022  1:09 PM EDT ----- Please do PA for Tezspire for asthma. On Xolair currently but not helping that much. Thank you.

## 2022-10-08 NOTE — Patient Instructions (Addendum)
Breathing:  Try to decrease smoking - smoke 1 less cigarette per week. Daily controller medication(s): start Breztri 2 puffs twice a day with spacer and rinse mouth afterwards. Spacer given and demonstrated proper use with inhaler. Patient understood technique and all questions/concerned were addressed.  STOP trelegy. Continue Xolair 375mg  every 2 weeks - given today. Will try to switch to Tezspire injections which is every 4 weeks.  During respiratory infections/flares:  Start budesonide 0.5mg  nebulizer twice a day for 1-2 weeks until your breathing symptoms return to baseline.  Pretreat with albuterol 2 puffs or albuterol nebulizer.  If you need to use your albuterol nebulizer machine back to back within 15-30 minutes with no relief then please go to the ER/urgent care for further evaluation.  May use albuterol rescue inhaler 2 puffs or nebulizer every 4 to 6 hours as needed for shortness of breath, chest tightness, coughing, and wheezing. May use albuterol rescue inhaler 2 puffs 5 to 15 minutes prior to strenuous physical activities. Monitor frequency of use.  Breathing control goals:  Full participation in all desired activities (may need albuterol before activity) Albuterol use two times or less a week on average (not counting use with activity) Cough interfering with sleep two times or less a month Oral steroids no more than once a year No hospitalizations  Environmental allergies 2022 skin testing: Positive to grass, ragweed, dust mites and feathers.  Continue environmental control measures as below. Continue Singulair (montelukast) 10mg  daily at night. Use over the counter antihistamines such as Zyrtec (cetirizine), Claritin (loratadine), Allegra (fexofenadine), or Xyzal (levocetirizine) daily as needed. May take twice a day during allergy flares. May switch antihistamines every few months.  Food: Continue to avoid shellfish, peanuts, sesame seeds, poppy seeds. For mild symptoms  you can take over the counter antihistamines such as Benadryl and monitor symptoms closely. If symptoms worsen or if you have severe symptoms including breathing issues, throat closure, significant swelling, whole body hives, severe diarrhea and vomiting, lightheadedness then inject epinephrine and seek immediate medical care afterwards. Action plan in place.   Heartburn: Continue lifestyle and dietary modifications. Continue famotidine 20mg  daily at night. Only use as needed - protonix 40mg  daily in the morning. Nothing to eat/drink for 30 minutes afterwards.   Follow up in 4 months or sooner if needed.

## 2022-10-08 NOTE — Telephone Encounter (Signed)
Called patient and advised [facility] carries [name] so will send rx to them with instructions for patient for delivery, storage, dosing and initial injection in clinic for admin isntructions

## 2022-10-14 ENCOUNTER — Other Ambulatory Visit: Payer: Self-pay | Admitting: Nurse Practitioner

## 2022-10-14 DIAGNOSIS — E782 Mixed hyperlipidemia: Secondary | ICD-10-CM

## 2022-10-14 DIAGNOSIS — E1143 Type 2 diabetes mellitus with diabetic autonomic (poly)neuropathy: Secondary | ICD-10-CM

## 2022-10-22 ENCOUNTER — Ambulatory Visit

## 2022-10-24 ENCOUNTER — Telehealth: Payer: Self-pay | Admitting: *Deleted

## 2022-10-24 NOTE — Telephone Encounter (Signed)
Patient called [facility] and they advised her they do not carry Tezspire. I had already reached out to them and they advised me they did carry same. So I guess we need alternative. I do knot they carry Dupixent but cannot advise on any other biologics. Please advise

## 2022-10-24 NOTE — Telephone Encounter (Signed)
Can you try the patient assistance program first?  I may need to repeat her cbc diff as I don't have a recent one on her to see if her eos are elevated.   Thank you.

## 2022-10-24 NOTE — Telephone Encounter (Signed)
On another note I would say we can try for patient assistance for drug through [employer] but I have not had much success from their PAP program

## 2022-11-26 ENCOUNTER — Other Ambulatory Visit: Payer: Self-pay | Admitting: Allergy

## 2022-11-26 ENCOUNTER — Other Ambulatory Visit: Payer: Self-pay | Admitting: Nurse Practitioner

## 2022-11-26 DIAGNOSIS — I700 Atherosclerosis of aorta: Secondary | ICD-10-CM

## 2022-12-09 ENCOUNTER — Other Ambulatory Visit: Payer: Self-pay | Admitting: Allergy

## 2022-12-09 ENCOUNTER — Other Ambulatory Visit: Payer: Self-pay | Admitting: Nurse Practitioner

## 2022-12-09 DIAGNOSIS — M797 Fibromyalgia: Secondary | ICD-10-CM

## 2022-12-09 DIAGNOSIS — E1143 Type 2 diabetes mellitus with diabetic autonomic (poly)neuropathy: Secondary | ICD-10-CM

## 2022-12-09 DIAGNOSIS — I10 Essential (primary) hypertension: Secondary | ICD-10-CM

## 2023-01-06 NOTE — Progress Notes (Signed)
[name], CMA,acting as a Neurosurgeon for Joyce Felts, [name].,have documented all relevant documentation on the behalf of Joyce Felts, [name],as directed by  Joyce Felts, [name] while in the presence of Joyce Felts, [name].  Subjective:  Patient ID: [name] , female    DOB: 10/25/1960 , 62 y.o.   MRN: [id number]  Chief Complaint  Patient presents with   Hypertension    HPI  Patient presents today for a bp and dm follow up, Patient reports compliance with medication. Patient denies any chest pain, SOB, or headaches. Patient has no concerns today. Patient reports a hysterectomy in 2008 or 2009. She is now being followed by Dr. Talmage Nap - now on Januvia, metformin, Evaristo Bury and Novolog sliding scale.   Wt Readings from Last 3 Encounters: 01/07/23 : 142 lb 3.2 oz (64.5 kg) 10/08/22 : 140 lb 14.4 oz (63.9 kg) 06/11/22 : 133 lb (60.3 kg)  She continues to have chest and nasal congestion.      Past Medical History:  Diagnosis Date   Acute kidney failure with lesion of tubular necrosis (HCC) 11/11/2016   Anxiety    Back pain    Dehydration 07/19/2017   Diabetes mellitus    Dyspnea 07/20/2017   Epistaxis 04/28/2018   Essential hypertension 11/11/2016   Fibromyalgia    High cholesterol    Hypertension    Hypomagnesemia 07/20/2017   Pancreatitis 07/19/2017   Periumbilical pain 05/27/2022   STRESS FRACTURE, FOOT 05/25/2008   Qualifier: Diagnosis of   By: Irving Burton MD, Clifton Custard      Replacing diagnoses that were inactivated after the 05/26/22 regulatory import     Syncope 11/11/2016     Family History  Problem Relation Age of Onset   Allergic rhinitis Mother    Hypertension Mother    Heart failure Mother    Allergic rhinitis Father    Diabetes Father    COPD Father    Emphysema Father    Heart failure Maternal Grandmother    Cancer Maternal Grandfather        LUNG    Heart disease Maternal Grandfather    Lung cancer Maternal Grandfather    Bronchitis Daughter        TWICE  YEARLY   Allergic rhinitis Daughter    Bronchitis Son        YEARLY   Allergic rhinitis Son    Food Allergy Grandchild    Asthma Neg Hx    Immunodeficiency Neg Hx    Eczema Neg Hx    Atopy Neg Hx    Angioedema Neg Hx      Current Outpatient Medications:    acetaminophen (TYLENOL) 500 MG tablet, Take 1,000 mg by mouth every 6 (six) hours as needed for moderate pain or headache., Disp: , Rfl:    albuterol (PROVENTIL) (2.5 MG/3ML) 0.083% nebulizer solution, INHALE THE CONTENTS OF ONE VIAL BY NEBULIZER EVERY 4 HOURS AS NEEDED FOR WHEEZING, SHORTNESS OF BREATH OR COUGHING FITS, Disp: 75 mL, Rfl: 1   albuterol (VENTOLIN HFA) 108 (90 Base) MCG/ACT inhaler, Inhale 2 puffs into the lungs every 4 (four) hours as needed for wheezing or shortness of breath., Disp: 1 each, Rfl: 5   alum & mag hydroxide-simeth (MAALOX/MYLANTA) 200-200-20 MG/5ML suspension, Take 15 mLs by mouth every 4 (four) hours as needed for indigestion or heartburn., Disp: 355 mL, Rfl: 0   aspirin 81 MG chewable tablet, Chew 81 mg by mouth daily., Disp: , Rfl:    benzonatate (TESSALON) 100 MG capsule,  Take 1 capsule (100 mg total) by mouth every 8 (eight) hours., Disp: 21 capsule, Rfl: 0   Blood Glucose Monitoring Suppl KIT, 1 each by Does not apply route 4 (four) times daily., Disp: 1 kit, Rfl: 0   Blood Pressure Monitoring (BLOOD PRESSURE CUFF) MISC, Use to check blood pressure twice a day., Disp: 1 each, Rfl: 1   Budeson-Glycopyrrol-Formoterol (BREZTRI AEROSPHERE) 160-9-4.8 MCG/ACT AERO, Inhale 2 puffs into the lungs in the morning and at bedtime. with spacer and rinse mouth afterwards., Disp: 10.7 g, Rfl: 5   budesonide (PULMICORT) 0.5 MG/2ML nebulizer solution, INHALE THE CONTENTS OF ONE VIAL IN NEBULIZER TWICE A DAY IN THE MORNING AND AT BEDTIME FOR ONE OR TWO WEEKS DURING BREATHING FLARES, Disp: 300 mL, Rfl: 2   CRESTOR 40 MG tablet, TAKE ONE TABLET BY MOUTH EVERY DAY, Disp: 90 tablet, Rfl: 1   cyclobenzaprine (FLEXERIL) 10 MG  tablet, TAKE ONE TABLET BY MOUTH THREE TIMES A DAY AS NEEDED, Disp: 30 tablet, Rfl: 2   EPIPEN 2-PAK 0.3 MG/0.3ML SOAJ injection, INJECT 0.3MG  (ONE INJECTION) INTRAMUSCULARLY AS DIRECTED AS NEEDED FOR ANAPHYLAXIS - (MAY REPEAT IN 5-15 MINUTES IF NEEDED; MORE THAN 2 DOSES SHOULD ONLY BE ADMINISTERED UNDER DIRECT MEDICAL SUPERVISION), Disp: 2 each, Rfl: 2   famotidine (PEPCID) 20 MG tablet, TAKE ONE TABLET BY MOUTH EVERY DAY IN THE EVENING, Disp: 90 tablet, Rfl: 0   fluconazole (DIFLUCAN) 100 MG tablet, Take 1 tablet (100 mg total) by mouth daily. Take 1 tablet by mouth now repeat in 5 days, Disp: 2 tablet, Rfl: 0   lidocaine (LIDODERM) 5 %, Place 1 patch onto the skin daily as needed (back pain). Remove & Discard patch within 12 hours or as directed by MD, Disp: 30 patch, Rfl: 1   loperamide (IMODIUM) 2 MG capsule, Take 1 capsule (2 mg total) by mouth every 6 (six) hours as needed for diarrhea or loose stools., Disp: 30 capsule, Rfl: 0   Magnesium 250 MG TABS, Take 1 tablet (250 mg total) by mouth daily. Take with evening meal (Patient taking differently: Take 250 mg by mouth at bedtime as needed (cramping).), Disp: 90 tablet, Rfl: 1   Melatonin 5 MG TABS, Take 5 mg by mouth at bedtime., Disp: , Rfl:    montelukast (SINGULAIR) 10 MG tablet, Take 1 tablet (10 mg total) by mouth at bedtime., Disp: 90 tablet, Rfl: 3   Multiple Vitamins-Minerals (ONE-A-DAY WOMENS PO), Take 1 tablet by mouth daily., Disp: , Rfl:    Tetrahydrozoline HCl (VISINE OP), Place 1 drop into both eyes daily as needed (dry/itchy eyes)., Disp: , Rfl:    Tezepelumab-ekko (TEZSPIRE) 210 MG/1. SOAJ, Inject 210 mg into the skin every 28 (twenty-eight) days., Disp: 5.73 mL, Rfl: 3   busPIRone (BUSPAR) 10 MG tablet, Take 1 tablet (10 mg total) by mouth daily., Disp: 90 tablet, Rfl: 1   DULoxetine (CYMBALTA) 60 MG capsule, Take 1 capsule (60 mg total) by mouth daily., Disp: 90 capsule, Rfl: 1   ezetimibe (ZETIA) 10 MG tablet, Take 1  tablet (10 mg total) by mouth daily., Disp: 90 tablet, Rfl: 2   gabapentin (NEURONTIN) 300 MG capsule, Take 1 capsule (300 mg total) by mouth 2 (two) times daily., Disp: 180 capsule, Rfl: 1   metFORMIN (GLUCOPHAGE-XR) 750 MG 24 hr tablet, Take 1 tablet by mouth daily, Disp: 90 tablet, Rfl: 1   olmesartan (BENICAR) 5 MG tablet, Take 1 tablet (5 mg total) by mouth daily., Disp: 90 tablet, Rfl: 1  pantoprazole (PROTONIX) 40 MG tablet, TAKE ONE TABLET BY MOUTH EVERY DAY 30 TO 60 MINUTES BEFORE FIRST MEAL OF THE DAY, Disp: 90 tablet, Rfl: 1   sitaGLIPtin (JANUVIA) 100 MG tablet, Take 1 tablet (100 mg total) by mouth daily., Disp: 90 tablet, Rfl: 1   Vitamin D, Ergocalciferol, (DRISDOL) 1.25 MG (50000 UNIT) CAPS capsule, Take 1 capsule (50,000 Units total) by mouth every 7 (seven) days., Disp: 12 capsule, Rfl: 1   Allergies  Allergen Reactions   Tylenol [Acetaminophen] Itching    With codiene   Pollen Extract    Shellfish Allergy Hives   Peanut-Containing Drug Products Hives     Review of Systems  Constitutional: Negative.   HENT:  Positive for ear pain (right ear pain).        Right side of face pain in the last week  Eyes: Negative.   Respiratory: Negative.    Cardiovascular: Negative.   Gastrointestinal: Negative.   Neurological: Negative.   Psychiatric/Behavioral: Negative.       Today's Vitals   01/07/23 1620  BP: 100/70  Pulse: 100  Temp: 98.5 F (36.9 C)  TempSrc: Oral  Weight: 142 lb 3.2 oz (64.5 kg)  Height: 5' (1.524 m)  PainSc: 0-No pain   Body mass index is 27.77 kg/m.  Wt Readings from Last 3 Encounters:  01/07/23 142 lb 3.2 oz (64.5 kg)  10/08/22 140 lb 14.4 oz (63.9 kg)  06/11/22 133 lb (60.3 kg)     Objective:  Physical Exam Vitals reviewed.  Constitutional:      General: She is not in acute distress.    Appearance: Normal appearance.  HENT:     Right Ear: Tympanic membrane, ear canal and external ear normal. There is no impacted cerumen.     Left  Ear: Tympanic membrane, ear canal and external ear normal. There is no impacted cerumen.  Eyes:     Pupils: Pupils are equal, round, and reactive to light.  Cardiovascular:     Rate and Rhythm: Normal rate and regular rhythm.     Pulses: Normal pulses.     Heart sounds: Normal heart sounds. No murmur heard. Pulmonary:     Effort: Pulmonary effort is normal. No respiratory distress.     Breath sounds: Normal breath sounds. No wheezing.     Comments: Productive cough  Skin:    General: Skin is warm and dry.  Neurological:     General: No focal deficit present.     Mental Status: She is alert and oriented to person, place, and time.     Cranial Nerves: No cranial nerve deficit.     Motor: No weakness.  Psychiatric:        Mood and Affect: Mood normal.        Behavior: Behavior normal.        Thought Content: Thought content normal.        Judgment: Judgment normal.         Assessment And Plan:  Poorly controlled type 2 diabetes mellitus (HCC) -     Microalbumin / creatinine urine ratio  Anxiety -     busPIRone HCl; Take 1 tablet (10 mg total) by mouth daily.  Dispense: 90 tablet; Refill: 1  Essential hypertension Assessment & Plan: Blood pressure is well-controlled.  Continue current medications.  Orders: -     Olmesartan Medoxomil; Take 1 tablet (5 mg total) by mouth daily.  Dispense: 90 tablet; Refill: 1 -     CMP14+EGFR  Mixed hyperlipidemia Assessment & Plan: Cholesterol levels are stable.  Continue statin, tolerating well.  Orders: -     Lipid panel -     Ezetimibe; Take 1 tablet (10 mg total) by mouth daily.  Dispense: 90 tablet; Refill: 2  Need for influenza vaccination Assessment & Plan: Influenza vaccine administered Encouraged to take Tylenol as needed for fever or muscle aches.   Orders: -     Flu vaccine trivalent PF, 6mos and older(Flulaval,Afluria,Fluarix,Fluzone)  Herpes zoster vaccination declined Assessment & Plan: Declines shingrix, educated  on disease process and is aware if he changes his mind to notify office    Atherosclerosis of aorta (HCC) Assessment & Plan: Continue statin, tolerating well   Type II diabetes mellitus with peripheral autonomic neuropathy [facility]) Assessment & Plan: Continue follow-up with endocrinology.  Continue current medications she is now on [place] and NovoLog sliding scale in addition to Januvia and metformin.  Orders: -     metFORMIN HCl ER; Take 1 tablet by mouth daily  Dispense: 90 tablet; Refill: 1 -     SITagliptin Phosphate; Take 1 tablet (100 mg total) by mouth daily.  Dispense: 90 tablet; Refill: 1  Nail deformity Assessment & Plan: She has has "lines" to her nailbeds, advised to make sure she is taking a multivitamin will check her vitamin d  Orders: -     VITAMIN D 25 Hydroxy (Vit-D Deficiency, Fractures)  Right ear pain Assessment & Plan: No abnormal findings with her right ear.  Orders: -     CBC with Differential/Platelet  Anxiety and depression Assessment & Plan: Stable, continue current medications.  Orders: -     CMP14+EGFR  Fibromyalgia Assessment & Plan: Stable medications refilled.  Orders: -     DULoxetine HCl; Take 1 capsule (60 mg total) by mouth daily.  Dispense: 90 capsule; Refill: 1 -     Gabapentin; Take 1 capsule (300 mg total) by mouth 2 (two) times daily.  Dispense: 180 capsule; Refill: 1  Type II diabetes mellitus with peripheral autonomic neuropathy [facility]) Assessment & Plan: Continue follow-up with endocrinology.  Continue current medications she is now on [place] and NovoLog sliding scale in addition to Januvia and metformin.  Orders: -     metFORMIN HCl ER; Take 1 tablet by mouth daily  Dispense: 90 tablet; Refill: 1 -     SITagliptin Phosphate; Take 1 tablet (100 mg total) by mouth daily.  Dispense: 90 tablet; Refill: 1  Cramps, muscle, general Assessment & Plan: She is to take magnesium as needed.  Will continue to monitor as she is  taking a statin.  Orders: -     Ambulatory referral to Physical Therapy -     CMP14+EGFR  Other orders -     Pantoprazole Sodium; TAKE ONE TABLET BY MOUTH EVERY DAY 30 TO 60 MINUTES BEFORE FIRST MEAL OF THE DAY  Dispense: 90 tablet; Refill: 1    No follow-ups on file.  Patient was given opportunity to ask questions. Patient verbalized understanding of the plan and was able to repeat key elements of the plan. All questions were answered to their satisfaction.    Jeanell Sparrow, [name], have reviewed all documentation for this visit. The documentation on 01/07/23 for the exam, diagnosis, procedures, and orders are all accurate and complete.   IF YOU HAVE BEEN REFERRED TO A SPECIALIST, IT MAY TAKE 1-2 WEEKS TO SCHEDULE/PROCESS THE REFERRAL. IF YOU HAVE NOT HEARD FROM US/SPECIALIST IN TWO WEEKS, PLEASE GIVE [place] A  CALL AT [phone].

## 2023-01-07 ENCOUNTER — Encounter: Payer: Self-pay | Admitting: Nurse Practitioner

## 2023-01-07 ENCOUNTER — Ambulatory Visit: Admitting: Nurse Practitioner

## 2023-01-07 VITALS — BP 100/70 | HR 100 | Temp 98.5°F | Ht 60.0 in | Wt 142.2 lb

## 2023-01-07 DIAGNOSIS — R252 Cramp and spasm: Secondary | ICD-10-CM

## 2023-01-07 DIAGNOSIS — L608 Other nail disorders: Secondary | ICD-10-CM

## 2023-01-07 DIAGNOSIS — I700 Atherosclerosis of aorta: Secondary | ICD-10-CM

## 2023-01-07 DIAGNOSIS — E1165 Type 2 diabetes mellitus with hyperglycemia: Secondary | ICD-10-CM | POA: Diagnosis not present

## 2023-01-07 DIAGNOSIS — Z23 Encounter for immunization: Secondary | ICD-10-CM

## 2023-01-07 DIAGNOSIS — E782 Mixed hyperlipidemia: Secondary | ICD-10-CM | POA: Insufficient documentation

## 2023-01-07 DIAGNOSIS — F32A Depression, unspecified: Secondary | ICD-10-CM

## 2023-01-07 DIAGNOSIS — F419 Anxiety disorder, unspecified: Secondary | ICD-10-CM

## 2023-01-07 DIAGNOSIS — H9201 Otalgia, right ear: Secondary | ICD-10-CM

## 2023-01-07 DIAGNOSIS — I10 Essential (primary) hypertension: Secondary | ICD-10-CM | POA: Diagnosis not present

## 2023-01-07 DIAGNOSIS — Z2821 Immunization not carried out because of patient refusal: Secondary | ICD-10-CM

## 2023-01-07 DIAGNOSIS — E1143 Type 2 diabetes mellitus with diabetic autonomic (poly)neuropathy: Secondary | ICD-10-CM

## 2023-01-07 DIAGNOSIS — M797 Fibromyalgia: Secondary | ICD-10-CM

## 2023-01-07 MED ORDER — DULOXETINE HCL 60 MG PO CPEP
60.0000 mg | ORAL_CAPSULE | Freq: Every day | ORAL | 1 refills | Status: DC
Start: 2023-01-07 — End: 2024-01-06

## 2023-01-07 MED ORDER — PANTOPRAZOLE SODIUM 40 MG PO TBEC: DELAYED_RELEASE_TABLET | ORAL | 1 refills | Status: DC

## 2023-01-07 MED ORDER — SITAGLIPTIN PHOSPHATE 100 MG PO TABS: 100.0000 mg | ORAL_TABLET | Freq: Every day | ORAL | 1 refills | Status: AC

## 2023-01-07 MED ORDER — BUSPIRONE HCL 10 MG PO TABS
10.0000 mg | ORAL_TABLET | Freq: Every day | ORAL | 1 refills | Status: DC
Start: 2023-01-07 — End: 2023-06-18

## 2023-01-07 MED ORDER — EZETIMIBE 10 MG PO TABS: 10.0000 mg | ORAL_TABLET | Freq: Every day | ORAL | 2 refills | Status: DC

## 2023-01-07 MED ORDER — GABAPENTIN 300 MG PO CAPS: 300.0000 mg | ORAL_CAPSULE | Freq: Two times a day (BID) | ORAL | 1 refills | Status: DC

## 2023-01-07 MED ORDER — METFORMIN HCL ER 750 MG PO TB24: ORAL_TABLET | ORAL | 1 refills | Status: DC

## 2023-01-07 MED ORDER — OLMESARTAN MEDOXOMIL 5 MG PO TABS: 5.0000 mg | ORAL_TABLET | Freq: Every day | ORAL | 1 refills | Status: DC

## 2023-01-08 ENCOUNTER — Other Ambulatory Visit: Payer: Self-pay | Admitting: Nurse Practitioner

## 2023-01-08 LAB — CMP14+EGFR
ALT: 35 [IU]/L — ABNORMAL HIGH (ref 0–32)
AST: 32 [IU]/L (ref 0–40)
Albumin: 4.4 g/dL (ref 3.9–4.9)
Alkaline Phosphatase: 99 [IU]/L (ref 44–121)
BUN/Creatinine Ratio: 11 — ABNORMAL LOW (ref 12–28)
BUN: 13 mg/dL (ref 8–27)
Bilirubin Total: <0.2 mg/dL (ref 0.0–1.2)
CO2: 21 mmol/L (ref 20–29)
Calcium: 9.8 mg/dL (ref 8.7–10.3)
Chloride: 103 mmol/L (ref 96–106)
Creatinine, Ser: 1.16 mg/dL — ABNORMAL HIGH (ref 0.57–1.00)
Globulin, Total: 3.2 g/dL (ref 1.5–4.5)
Glucose: 187 mg/dL — ABNORMAL HIGH (ref 70–99)
Potassium: 4 mmol/L (ref 3.5–5.2)
Sodium: 140 mmol/L (ref 134–144)
Total Protein: 7.6 g/dL (ref 6.0–8.5)
eGFR: 53 mL/min/{1.73_m2} — ABNORMAL LOW (ref >59)

## 2023-01-08 LAB — CBC WITH DIFFERENTIAL/PLATELET
Basophils Absolute: 0.1 10*3/uL (ref 0.0–0.2)
Basos: 1 %
EOS (ABSOLUTE): 0.1 10*3/uL (ref 0.0–0.4)
Eos: 1 %
Hematocrit: 44.4 % (ref 34.0–46.6)
Hemoglobin: 14.4 g/dL (ref 11.1–15.9)
Immature Grans (Abs): 0 10*3/uL (ref 0.0–0.1)
Immature Granulocytes: 0 %
Lymphocytes Absolute: 1.7 10*3/uL (ref 0.7–3.1)
Lymphs: 29 %
MCH: 27.5 pg (ref 26.6–33.0)
MCHC: 32.4 g/dL (ref 31.5–35.7)
MCV: 85 fL (ref 79–97)
Monocytes Absolute: 0.3 10*3/uL (ref 0.1–0.9)
Monocytes: 5 %
Neutrophils Absolute: 3.7 10*3/uL (ref 1.4–7.0)
Neutrophils: 64 %
Platelets: 256 10*3/uL (ref 150–450)
RBC: 5.24 x10E6/uL (ref 3.77–5.28)
RDW: 12.7 % (ref 11.7–15.4)
WBC: 5.8 10*3/uL (ref 3.4–10.8)

## 2023-01-08 LAB — MICROALBUMIN / CREATININE URINE RATIO
Creatinine, Urine: 138.4 mg/dL
Microalb/Creat Ratio: 40 mg/g{creat} — ABNORMAL HIGH (ref 0–29)
Microalbumin, Urine: 55.2 ug/mL

## 2023-01-08 LAB — VITAMIN D 25 HYDROXY (VIT D DEFICIENCY, FRACTURES): Vit D, 25-Hydroxy: 15.8 ng/mL — ABNORMAL LOW (ref 30.0–100.0)

## 2023-01-08 LAB — LIPID PANEL
Chol/HDL Ratio: 1.8 ratio (ref 0.0–4.4)
Cholesterol, Total: 122 mg/dL (ref 100–199)
HDL: 66 mg/dL (ref >39)
LDL Chol Calc (NIH): 37 mg/dL (ref 0–99)
Triglycerides: 104 mg/dL (ref 0–149)
VLDL Cholesterol Cal: 19 mg/dL (ref 5–40)

## 2023-01-08 MED ORDER — VITAMIN D (ERGOCALCIFEROL) 1.25 MG (50000 UNIT) PO CAPS: 50000.0000 [IU] | ORAL_CAPSULE | ORAL | 1 refills | Status: DC

## 2023-01-17 DIAGNOSIS — H9201 Otalgia, right ear: Secondary | ICD-10-CM | POA: Insufficient documentation

## 2023-01-17 DIAGNOSIS — R252 Cramp and spasm: Secondary | ICD-10-CM | POA: Insufficient documentation

## 2023-01-17 DIAGNOSIS — L608 Other nail disorders: Secondary | ICD-10-CM | POA: Insufficient documentation

## 2023-01-17 NOTE — Assessment & Plan Note (Signed)
Stable medications refilled.

## 2023-01-17 NOTE — Assessment & Plan Note (Signed)
She has has "lines" to her nailbeds, advised to make sure she is taking a multivitamin will check her vitamin d

## 2023-01-17 NOTE — Assessment & Plan Note (Signed)
Continue follow-up with endocrinology.  Continue current medications she is now on [place] and NovoLog sliding scale in addition to Januvia and metformin.

## 2023-01-17 NOTE — Assessment & Plan Note (Signed)
Cholesterol levels are stable.  Continue statin, tolerating well.

## 2023-01-17 NOTE — Assessment & Plan Note (Signed)
She is to take magnesium as needed.  Will continue to monitor as she is taking a statin.

## 2023-01-17 NOTE — Assessment & Plan Note (Signed)
No abnormal findings with her right ear.

## 2023-01-17 NOTE — Assessment & Plan Note (Signed)
Continue statin, tolerating well 

## 2023-01-17 NOTE — Assessment & Plan Note (Signed)
Declines shingrix, educated on disease process and is aware if he changes his mind to notify office  

## 2023-01-17 NOTE — Assessment & Plan Note (Signed)
Influenza vaccine administered Encouraged to take Tylenol as needed for fever or muscle aches.

## 2023-01-17 NOTE — Assessment & Plan Note (Deleted)
Stable

## 2023-01-17 NOTE — Assessment & Plan Note (Signed)
Stable, continue current medications.

## 2023-01-17 NOTE — Assessment & Plan Note (Signed)
Blood pressure is well controlled. Continue current medications. 

## 2023-01-25 NOTE — Progress Notes (Unsigned)
Follow Up Note  RE: [name] MRN: [id number] DOB: 03-31-1960 Date of Office Visit: 01/26/2023  Referring provider: Arnette Felts, [name] Primary care provider: Arnette Felts, [name]  Chief Complaint: No chief complaint on file.  History of Present Illness: I had the pleasure of seeing [name] for a follow up visit at the [facility] on 01/25/2023. She is a 62 y.o. female, who is being followed for asthma/COPD, allergic rhinoconjunctivitis, GERD, food allergy. Her previous allergy office visit was on 10/08/2022 with Dr. Selena Batten. Today is a regular follow up visit.  Discussed the use of AI scribe software for clinical note transcription with the patient, who gave verbal consent to proceed.  History of Present Illness          Is she still on biologics?  Asthma-COPD overlap syndrome Tobacco user Past history - 30+ year smoking history and follows with pulmonology. Eos 200, IgE 427. 2022 spirometry showed: restrictive disease with 11% improvement in FEV1 post bronchodilator treatment. Clinically feeling improved. Started Xolair 375mg  every 2 weeks in August 2023.  Interim history - not controlled and still smoking. No prednisone. Trelegy irritates her mouth.  Today's spirometry was unremarkable. Encouraged smoking cessation. Daily controller medication(s): start Breztri 2 puffs twice a day with spacer and rinse mouth afterwards. Spacer given and demonstrated proper use with inhaler. Patient understood technique and all questions/concerned were addressed.  STOP trelegy. Continue Xolair 375mg  every 2 weeks - given today. Will try to switch to Tezspire injections which is every 4 weeks.  During respiratory infections/flares:  Start budesonide 0.5mg  nebulizer twice a day for 1-2 weeks until your breathing symptoms return to baseline.  Pretreat with albuterol 2 puffs or albuterol nebulizer.  If you need to use your albuterol nebulizer machine back to back within  15-30 minutes with no relief then please go to the ER/urgent care for further evaluation.  May use albuterol rescue inhaler 2 puffs or nebulizer every 4 to 6 hours as needed for shortness of breath, chest tightness, coughing, and wheezing. May use albuterol rescue inhaler 2 puffs 5 to 15 minutes prior to strenuous physical activities. Monitor frequency of use.  Get spirometry at next visit.   Seasonal and perennial allergic rhinoconjunctivitis Seasonal allergic rhinitis due to pollen Allergic rhinitis due to dust mite Past history - On AIT briefly but stopped due to hives and tingling sensation in the face. 2022 skin testing showed: Positive to grass, ragweed, dust mites and feathers. Declines nasal sprays.  Continue environmental control measures as below. Continue Singulair (montelukast) 10mg  daily at night. Use over the counter antihistamines such as Zyrtec (cetirizine), Claritin (loratadine), Allegra (fexofenadine), or Xyzal (levocetirizine) daily as needed. May take twice a day during allergy flares. May switch antihistamines every few months.   Gastroesophageal reflux disease, unspecified whether esophagitis present Continue lifestyle and dietary modifications. Continue famotidine 20mg  daily at night. Only use as needed - protonix 40mg  daily in the morning. Nothing to eat/drink for 30 minutes afterwards.    Food Past history - Shellfish caused tingling on her face, some finned fish cause diarrhea, peanuts cause tingling and hives, sesame seeds and poppy seeds cause hives. 2022 skin testing was negative to select foods. 2022 bloodwork was positive to shrimp only. Borderline positive to clam, scallop and lobster. Negative to fish panel, tree nuts, peanuts, sesame seed and poppy seed. Not interested in food challenges. Tolerates finned fish.  Continue to avoid shellfish, peanuts, sesame seeds, poppy seeds. For mild symptoms  you can take over the counter antihistamines such as Benadryl and  monitor symptoms closely. If symptoms worsen or if you have severe symptoms including breathing issues, throat closure, significant swelling, whole body hives, severe diarrhea and vomiting, lightheadedness then inject epinephrine and seek immediate medical care afterwards. Action plan in place.   Assessment and Plan: [name] is a 62 y.o. female with: Asthma-COPD overlap syndrome (HCC) Tobacco user Past history - 30+ year smoking history and follows with pulmonology. Eos 200, IgE 427. 2022 spirometry showed: restrictive disease with 11% improvement in FEV1 post bronchodilator treatment. Clinically feeling improved. Started Xolair 375mg  every 2 weeks in August 2023.  Interim history - not controlled and still smoking. No prednisone. Trelegy irritates her mouth.   Allergic rhinitis due to dust mite Seasonal allergic rhinitis due to pollen Allergic conjunctivitis of both eyes Past history - On AIT briefly but stopped due to hives and tingling sensation in the face. 2022 skin testing positive to grass, ragweed, dust mites and feathers. Declines nasal sprays.   Gastroesophageal reflux disease, unspecified whether esophagitis present ***  Other adverse food reactions, not elsewhere classified, subsequent encounter Past history - Shellfish caused tingling on her face, some finned fish cause diarrhea, peanuts cause tingling and hives, sesame seeds and poppy seeds cause hives. 2022 skin testing negative to select foods. 2022 bloodwork positive to shrimp only. Borderline positive to clam, scallop and lobster. Negative to fish panel, tree nuts, peanuts, sesame seed and poppy seed. Not interested in food challenges. Tolerates finned fish.   Assessment and Plan              No follow-ups on file.  No orders of the defined types were placed in this encounter.  Lab Orders  No laboratory test(s) ordered today    Diagnostics: Spirometry:  Tracings reviewed. Her effort: {Blank single:19197::"Good  reproducible efforts.","It was hard to get consistent efforts and there is a question as to whether this reflects a maximal maneuver.","Poor effort, data can not be interpreted."} FVC: ***L FEV1: ***L, ***% predicted FEV1/FVC ratio: ***% Interpretation: {Blank single:19197::"Spirometry consistent with mild obstructive disease","Spirometry consistent with moderate obstructive disease","Spirometry consistent with severe obstructive disease","Spirometry consistent with possible restrictive disease","Spirometry consistent with mixed obstructive and restrictive disease","Spirometry uninterpretable due to technique","Spirometry consistent with normal pattern","No overt abnormalities noted given today's efforts"}.  Please see scanned spirometry results for details.  Skin Testing: {Blank single:19197::"Select foods","Environmental allergy panel","Environmental allergy panel and select foods","Food allergy panel","None","Deferred due to recent antihistamines use"}. *** Results discussed with patient/family.   Medication List:  Current Outpatient Medications  Medication Sig Dispense Refill   acetaminophen (TYLENOL) 500 MG tablet Take 1,000 mg by mouth every 6 (six) hours as needed for moderate pain or headache.     albuterol (PROVENTIL) (2.5 MG/3ML) 0.083% nebulizer solution INHALE THE CONTENTS OF ONE VIAL BY NEBULIZER EVERY 4 HOURS AS NEEDED FOR WHEEZING, SHORTNESS OF BREATH OR COUGHING FITS 75 mL 1   albuterol (VENTOLIN HFA) 108 (90 Base) MCG/ACT inhaler Inhale 2 puffs into the lungs every 4 (four) hours as needed for wheezing or shortness of breath. 1 each 5   alum & mag hydroxide-simeth (MAALOX/MYLANTA) 200-200-20 MG/5ML suspension Take 15 mLs by mouth every 4 (four) hours as needed for indigestion or heartburn. 355 mL 0   aspirin 81 MG chewable tablet Chew 81 mg by mouth daily.     benzonatate (TESSALON) 100 MG capsule Take 1 capsule (100 mg total) by mouth every 8 (eight) hours. 21 capsule 0   Blood  Glucose Monitoring Suppl KIT 1 each by Does not apply route 4 (four) times daily. 1 kit 0   Blood Pressure Monitoring (BLOOD PRESSURE CUFF) MISC Use to check blood pressure twice a day. 1 each 1   Budeson-Glycopyrrol-Formoterol (BREZTRI AEROSPHERE) 160-9-4.8 MCG/ACT AERO Inhale 2 puffs into the lungs in the morning and at bedtime. with spacer and rinse mouth afterwards. 10.7 g 5   budesonide (PULMICORT) 0.5 MG/2ML nebulizer solution INHALE THE CONTENTS OF ONE VIAL IN NEBULIZER TWICE A DAY IN THE MORNING AND AT BEDTIME FOR ONE OR TWO WEEKS DURING BREATHING FLARES 300 mL 2   busPIRone (BUSPAR) 10 MG tablet Take 1 tablet (10 mg total) by mouth daily. 90 tablet 1   CRESTOR 40 MG tablet TAKE ONE TABLET BY MOUTH EVERY DAY 90 tablet 1   cyclobenzaprine (FLEXERIL) 10 MG tablet TAKE ONE TABLET BY MOUTH THREE TIMES A DAY AS NEEDED 30 tablet 2   DULoxetine (CYMBALTA) 60 MG capsule Take 1 capsule (60 mg total) by mouth daily. 90 capsule 1   EPIPEN 2-PAK 0.3 MG/0.3ML SOAJ injection INJECT 0.3MG  (ONE INJECTION) INTRAMUSCULARLY AS DIRECTED AS NEEDED FOR ANAPHYLAXIS - (MAY REPEAT IN 5-15 MINUTES IF NEEDED; MORE THAN 2 DOSES SHOULD ONLY BE ADMINISTERED UNDER DIRECT MEDICAL SUPERVISION) 2 each 2   ezetimibe (ZETIA) 10 MG tablet Take 1 tablet (10 mg total) by mouth daily. 90 tablet 2   famotidine (PEPCID) 20 MG tablet TAKE ONE TABLET BY MOUTH EVERY DAY IN THE EVENING 90 tablet 0   fluconazole (DIFLUCAN) 100 MG tablet Take 1 tablet (100 mg total) by mouth daily. Take 1 tablet by mouth now repeat in 5 days 2 tablet 0   gabapentin (NEURONTIN) 300 MG capsule Take 1 capsule (300 mg total) by mouth 2 (two) times daily. 180 capsule 1   lidocaine (LIDODERM) 5 % Place 1 patch onto the skin daily as needed (back pain). Remove & Discard patch within 12 hours or as directed by MD 30 patch 1   loperamide (IMODIUM) 2 MG capsule Take 1 capsule (2 mg total) by mouth every 6 (six) hours as needed for diarrhea or loose stools. 30 capsule  0   Magnesium 250 MG TABS Take 1 tablet (250 mg total) by mouth daily. Take with evening meal (Patient taking differently: Take 250 mg by mouth at bedtime as needed (cramping).) 90 tablet 1   Melatonin 5 MG TABS Take 5 mg by mouth at bedtime.     metFORMIN (GLUCOPHAGE-XR) 750 MG 24 hr tablet Take 1 tablet by mouth daily 90 tablet 1   montelukast (SINGULAIR) 10 MG tablet Take 1 tablet (10 mg total) by mouth at bedtime. 90 tablet 3   Multiple Vitamins-Minerals (ONE-A-DAY WOMENS PO) Take 1 tablet by mouth daily.     olmesartan (BENICAR) 5 MG tablet Take 1 tablet (5 mg total) by mouth daily. 90 tablet 1   pantoprazole (PROTONIX) 40 MG tablet TAKE ONE TABLET BY MOUTH EVERY DAY 30 TO 60 MINUTES BEFORE FIRST MEAL OF THE DAY 90 tablet 1   sitaGLIPtin (JANUVIA) 100 MG tablet Take 1 tablet (100 mg total) by mouth daily. 90 tablet 1   Tetrahydrozoline HCl (VISINE OP) Place 1 drop into both eyes daily as needed (dry/itchy eyes).     Tezepelumab-ekko (TEZSPIRE) 210 MG/1. SOAJ Inject 210 mg into the skin every 28 (twenty-eight) days. 5.73 mL 3   Vitamin D, Ergocalciferol, (DRISDOL) 1.25 MG (50000 UNIT) CAPS capsule Take 1 capsule (50,000 Units total) by mouth every  7 (seven) days. 12 capsule 1   No current facility-administered medications for this visit.   Allergies: Allergies  Allergen Reactions   Tylenol [Acetaminophen] Itching    With codiene   Pollen Extract    Shellfish Allergy Hives   Peanut-Containing Drug Products Hives   I reviewed her past medical history, social history, family history, and environmental history and no significant changes have been reported from her previous visit.  Review of Systems  Constitutional:  Negative for appetite change, chills, fever and unexpected weight change.  HENT:  Negative for congestion, nosebleeds, rhinorrhea and sneezing.   Eyes:  Negative for itching.  Respiratory:  Positive for shortness of breath. Negative for cough, chest tightness and  wheezing.   Cardiovascular:  Negative for chest pain.  Gastrointestinal:  Negative for abdominal pain.  Genitourinary:  Negative for difficulty urinating.  Skin:  Negative for rash.  Allergic/Immunologic: Positive for environmental allergies and food allergies.    Objective: There were no vitals taken for this visit. There is no height or weight on file to calculate BMI. Physical Exam Vitals and nursing note reviewed.  Constitutional:      Appearance: Normal appearance. She is well-developed.  HENT:     Head: Normocephalic and atraumatic.     Right Ear: Tympanic membrane and external ear normal.     Left Ear: Tympanic membrane and external ear normal.     Nose: Nose normal.     Mouth/Throat:     Mouth: Mucous membranes are dry.     Pharynx: Oropharynx is clear.  Eyes:     Conjunctiva/sclera: Conjunctivae normal.  Cardiovascular:     Rate and Rhythm: Normal rate and regular rhythm.     Heart sounds: Normal heart sounds. No murmur heard.    No friction rub. No gallop.  Pulmonary:     Effort: Pulmonary effort is normal.     Breath sounds: Normal breath sounds. No wheezing, rhonchi or rales.  Abdominal:     Palpations: Abdomen is soft.  Musculoskeletal:     Cervical back: Neck supple.  Skin:    General: Skin is warm.     Findings: No rash.  Neurological:     Mental Status: She is alert and oriented to person, place, and time.  Psychiatric:        Behavior: Behavior normal.    Previous notes and tests were reviewed. The plan was reviewed with the patient/family, and all questions/concerned were addressed.  It was my pleasure to see [name] today and participate in her care. Please feel free to contact me with any questions or concerns.  Sincerely,  Wyline Mood, DO Allergy & Immunology  [facility] office: [phone] [facility] office: [phone]

## 2023-01-26 ENCOUNTER — Ambulatory Visit (INDEPENDENT_AMBULATORY_CARE_PROVIDER_SITE_OTHER): Admitting: Allergy

## 2023-01-26 ENCOUNTER — Other Ambulatory Visit: Payer: Self-pay

## 2023-01-26 ENCOUNTER — Telehealth: Payer: Self-pay | Admitting: *Deleted

## 2023-01-26 ENCOUNTER — Encounter: Payer: Self-pay | Admitting: Allergy

## 2023-01-26 VITALS — BP 108/74 | HR 88 | Temp 98.0°F | Resp 12 | Wt 143.2 lb

## 2023-01-26 DIAGNOSIS — J4489 Other specified chronic obstructive pulmonary disease: Secondary | ICD-10-CM | POA: Diagnosis not present

## 2023-01-26 DIAGNOSIS — J3089 Other allergic rhinitis: Secondary | ICD-10-CM

## 2023-01-26 DIAGNOSIS — Z720 Tobacco use: Secondary | ICD-10-CM

## 2023-01-26 DIAGNOSIS — J301 Allergic rhinitis due to pollen: Secondary | ICD-10-CM | POA: Diagnosis not present

## 2023-01-26 DIAGNOSIS — T781XXD Other adverse food reactions, not elsewhere classified, subsequent encounter: Secondary | ICD-10-CM

## 2023-01-26 DIAGNOSIS — K219 Gastro-esophageal reflux disease without esophagitis: Secondary | ICD-10-CM

## 2023-01-26 DIAGNOSIS — H1013 Acute atopic conjunctivitis, bilateral: Secondary | ICD-10-CM

## 2023-01-26 MED ORDER — BUDESONIDE 0.5 MG/2ML IN SUSP: 0.5000 mg | Freq: Two times a day (BID) | RESPIRATORY_TRACT | 2 refills | Status: DC | PRN

## 2023-01-26 MED ORDER — BENZONATATE 100 MG PO CAPS: 100.0000 mg | ORAL_CAPSULE | Freq: Every day | ORAL | 0 refills | Status: DC | PRN

## 2023-01-26 MED ORDER — BREZTRI AEROSPHERE 160-9-4.8 MCG/ACT IN AERO: 2.0000 | INHALATION_SPRAY | Freq: Two times a day (BID) | RESPIRATORY_TRACT | 5 refills | Status: DC

## 2023-01-26 MED ORDER — ALBUTEROL SULFATE (2.5 MG/3ML) 0.083% IN NEBU: 2.5000 mg | INHALATION_SOLUTION | RESPIRATORY_TRACT | 1 refills | Status: DC | PRN

## 2023-01-26 MED ORDER — ALBUTEROL SULFATE HFA 108 (90 BASE) MCG/ACT IN AERS: 2.0000 | INHALATION_SPRAY | RESPIRATORY_TRACT | 1 refills | Status: DC | PRN

## 2023-01-26 NOTE — Patient Instructions (Addendum)
Breathing:  Try to decrease smoking - smoke 1 less cigarette per week. Daily controller medication(s): continue Breztri 2 puffs twice a day with spacer and rinse mouth afterwards. Start budesonide 0.5mg  nebulizer twice a day.  Restart Xolair 375mg  every 2 weeks.  During respiratory infections/flares:  Pretreat with albuterol 2 puffs or albuterol nebulizer.  If you need to use your albuterol nebulizer machine back to back within 15-30 minutes with no relief then please go to the ER/urgent care for further evaluation.  May use albuterol rescue inhaler 2 puffs or nebulizer every 4 to 6 hours as needed for shortness of breath, chest tightness, coughing, and wheezing. May use albuterol rescue inhaler 2 puffs 5 to 15 minutes prior to strenuous physical activities. Monitor frequency of use - if you need to use it more than twice per week on a consistent basis let us know.  Breathing control goals:  Full participation in all desired activities (may need albuterol before activity) Albuterol use two times or less a week on average (not counting use with activity) Cough interfering with sleep two times or less a month Oral steroids no more than once a year No hospitalizations  Environmental allergies 2022 skin testing positive to grass, ragweed, dust mites and feathers.  Continue environmental control measures. Continue Singulair (montelukast) 10mg  daily at night. Use over the counter antihistamines such as Zyrtec (cetirizine), Claritin (loratadine), Allegra (fexofenadine), or Xyzal (levocetirizine) daily as needed. May take twice a day during allergy flares. May switch antihistamines every few months.  Food: Continue to avoid shellfish, peanuts, sesame seeds, poppy seeds. For mild symptoms you can take over the counter antihistamines such as Benadryl 1-2 tablets = 25-50mg  and monitor symptoms closely. If symptoms worsen or if you have severe symptoms including breathing issues, throat closure,  significant swelling, whole body hives, severe diarrhea and vomiting, lightheadedness then inject epinephrine and seek immediate medical care afterwards. Emergency action plan in place.   Heartburn: Continue lifestyle and dietary modifications. Continue famotidine 20mg  daily at night. Continue Protonix 40mg  daily in the morning. Nothing to eat/drink for 30 minutes afterwards.   Follow up in 3 months or sooner if needed.

## 2023-01-26 NOTE — Telephone Encounter (Signed)
-----   Message from Ellamae Sia sent at 01/26/2023  9:24 AM EST ----- Restart Xolair 375mg  every 2 weeks please as before for asthma. Thank you.

## 2023-01-26 NOTE — Telephone Encounter (Signed)
Tried to reach patient to discuss getting xolair from [facility] but unable to leave message. Her Ins does not carry Xolair

## 2023-02-17 NOTE — Telephone Encounter (Signed)
Called patient and advised [facility]AP only way to get Xolair since [facility] doesn't carry

## 2023-03-03 ENCOUNTER — Other Ambulatory Visit: Payer: Self-pay | Admitting: Nurse Practitioner

## 2023-03-03 DIAGNOSIS — R252 Cramp and spasm: Secondary | ICD-10-CM

## 2023-04-09 ENCOUNTER — Other Ambulatory Visit: Payer: Self-pay | Admitting: *Deleted

## 2023-04-09 MED ORDER — OMALIZUMAB 75 MG/0.5ML ~~LOC~~ SOSY: 375.0000 mg | PREFILLED_SYRINGE | SUBCUTANEOUS | 11 refills | Status: DC

## 2023-04-09 MED ORDER — XOLAIR 300 MG/2ML ~~LOC~~ SOSY: 375.0000 mg | PREFILLED_SYRINGE | SUBCUTANEOUS | 11 refills | Status: DC

## 2023-04-14 ENCOUNTER — Telehealth: Payer: Self-pay | Admitting: *Deleted

## 2023-04-14 NOTE — Telephone Encounter (Signed)
 L/m for patient approval for PAP for Xolair and delivery 2/25. She can call to schedule restartt

## 2023-04-26 NOTE — Progress Notes (Deleted)
 Follow Up Note  RE: [name] MRN: [id number] DOB: 03/26/1960 Date of Office Visit: 04/27/2023  Referring provider: Arnette Felts, [name] Primary care provider: Arnette Felts, [name]  Chief Complaint: No chief complaint on file.  History of Present Illness: I had the pleasure of seeing [name] for a follow up visit at the [facility] on 04/26/2023. She is a 63 y.o. female, who is being followed for asthma/COPD, tobacco user, allergic rhinoconjunctivitis, GERD, adverse food reaction. Her previous allergy office visit was on January 26, 2023 with Dr. Selena Batten. Today is a regular follow up visit.  Discussed the use of AI scribe software for clinical note transcription with the patient, who gave verbal consent to proceed.  History of Present Illness            Did she restart Xolair?  Assessment and Plan: [name] is a 63 y.o. female with: Asthma-COPD overlap syndrome (HCC) Tobacco user Past history - 30+ year smoking history and follows with pulmonology. Eos 200, IgE 427. 2022 spirometry showed: restrictive disease with 11% improvement in FEV1 post bronchodilator treatment. Clinically feeling improved. Started Xolair 375mg  every 2 weeks in August 2023. Trelegy irritates her mouth.  Interim history - not controlled and still smoking. No prednisone. [place] not covered. Prefers Clinical cytogeneticist. Today's spirometry showed some restriction. Try to decrease smoking - smoke 1 less cigarette per week. Daily controller medication(s): continue Breztri 2 puffs twice a day with spacer and rinse mouth afterwards. Start budesonide 0.5mg  nebulizer twice a day.  Restart Xolair 375mg  every 2 weeks.  During respiratory infections/flares:  Pretreat with albuterol 2 puffs or albuterol nebulizer.  If you need to use your albuterol nebulizer machine back to back within 15-30 minutes with no relief then please go to the ER/urgent care for further evaluation.  May use albuterol rescue inhaler  2 puffs or nebulizer every 4 to 6 hours as needed for shortness of breath, chest tightness, coughing, and wheezing. May use albuterol rescue inhaler 2 puffs 5 to 15 minutes prior to strenuous physical activities. Monitor frequency of use - if you need to use it more than twice per week on a consistent basis let us know.    Allergic rhinitis due to dust mite Seasonal allergic rhinitis due to pollen Allergic conjunctivitis of both eyes Past history - On AIT briefly but stopped due to hives and tingling sensation in the face. 2022 skin testing positive to grass, ragweed, dust mites and feathers. Declines nasal sprays.  Interim history - Increased sneezing, currently managed with over-the-counter antihistamines and Singulair. Continue environmental control measures. Continue Singulair (montelukast) 10mg  daily at night. Use over the counter antihistamines such as Zyrtec (cetirizine), Claritin (loratadine), Allegra (fexofenadine), or Xyzal (levocetirizine) daily as needed. May take twice a day during allergy flares. May switch antihistamines every few months.   Gastroesophageal reflux disease, unspecified whether esophagitis present Stable. Unable to wean off PPI. Continue lifestyle and dietary modifications. Continue famotidine 20mg  daily at night. Continue Protonix 40mg  daily in the morning. Nothing to eat/drink for 30 minutes afterwards.    Other adverse food reactions, not elsewhere classified, subsequent encounter Past history - Shellfish caused tingling on her face, some finned fish cause diarrhea, peanuts cause tingling and hives, sesame seeds and poppy seeds cause hives. 2022 skin testing negative to select foods. 2022 bloodwork positive to shrimp only. Borderline positive to clam, scallop and lobster. Negative to fish panel, tree nuts, peanuts, sesame seed and poppy seed. Not interested in food  challenges. Tolerates finned fish.  Continue to avoid shellfish, peanuts, sesame seeds, poppy  seeds. For mild symptoms you can take over the counter antihistamines such as Benadryl 1-2 tablets = 25-50mg  and monitor symptoms closely. If symptoms worsen or if you have severe symptoms including breathing issues, throat closure, significant swelling, whole body hives, severe diarrhea and vomiting, lightheadedness then inject epinephrine and seek immediate medical care afterwards. Emergency action plan in place.  Assessment and Plan              No follow-ups on file.  No orders of the defined types were placed in this encounter.  Lab Orders  No laboratory test(s) ordered today    Diagnostics: Spirometry:  Tracings reviewed. Her effort: {Blank single:19197::"Good reproducible efforts.","It was hard to get consistent efforts and there is a question as to whether this reflects a maximal maneuver.","Poor effort, data can not be interpreted."} FVC: ***L FEV1: ***L, ***% predicted FEV1/FVC ratio: ***% Interpretation: {Blank single:19197::"Spirometry consistent with mild obstructive disease","Spirometry consistent with moderate obstructive disease","Spirometry consistent with severe obstructive disease","Spirometry consistent with possible restrictive disease","Spirometry consistent with mixed obstructive and restrictive disease","Spirometry uninterpretable due to technique","Spirometry consistent with normal pattern","No overt abnormalities noted given today's efforts"}.  Please see scanned spirometry results for details.  Skin Testing: {Blank single:19197::"Select foods","Environmental allergy panel","Environmental allergy panel and select foods","Food allergy panel","None","Deferred due to recent antihistamines use"}. *** Results discussed with patient/family.   Medication List:  Current Outpatient Medications  Medication Sig Dispense Refill   acetaminophen (TYLENOL) 500 MG tablet Take 1,000 mg by mouth every 6 (six) hours as needed for moderate pain or headache.     albuterol  (PROVENTIL) (2.5 MG/3ML) 0.083% nebulizer solution Take 3 mLs (2.5 mg total) by nebulization every 4 (four) hours as needed for wheezing or shortness of breath (coughing fits). 75 mL 1   albuterol (VENTOLIN HFA) 108 (90 Base) MCG/ACT inhaler Inhale 2 puffs into the lungs every 4 (four) hours as needed for wheezing or shortness of breath (coughing fits). 18 g 1   alum & mag hydroxide-simeth (MAALOX/MYLANTA) 200-200-20 MG/5ML suspension Take 15 mLs by mouth every 4 (four) hours as needed for indigestion or heartburn. 355 mL 0   aspirin 81 MG chewable tablet Chew 81 mg by mouth daily.     benzonatate (TESSALON) 100 MG capsule Take 1 capsule (100 mg total) by mouth daily as needed for cough. 30 capsule 0   Blood Glucose Monitoring Suppl KIT 1 each by Does not apply route 4 (four) times daily. 1 kit 0   Blood Pressure Monitoring (BLOOD PRESSURE CUFF) MISC Use to check blood pressure twice a day. 1 each 1   Budeson-Glycopyrrol-Formoterol (BREZTRI AEROSPHERE) 160-9-4.8 MCG/ACT AERO Inhale 2 puffs into the lungs in the morning and at bedtime. with spacer and rinse mouth afterwards. 10.7 g 5   budesonide (PULMICORT) 0.5 MG/2ML nebulizer solution Take 2 mLs (0.5 mg total) by nebulization 2 (two) times daily as needed. 120 mL 2   busPIRone (BUSPAR) 10 MG tablet Take 1 tablet (10 mg total) by mouth daily. 90 tablet 1   CRESTOR 40 MG tablet TAKE ONE TABLET BY MOUTH EVERY DAY 90 tablet 1   cyclobenzaprine (FLEXERIL) 10 MG tablet TAKE ONE TABLET BY MOUTH THREE TIMES A DAY AS NEEDED 30 tablet 2   DULoxetine (CYMBALTA) 60 MG capsule Take 1 capsule (60 mg total) by mouth daily. 90 capsule 1   EPIPEN 2-PAK 0.3 MG/0.3ML SOAJ injection INJECT 0.3MG  (ONE INJECTION) INTRAMUSCULARLY AS DIRECTED AS NEEDED  FOR ANAPHYLAXIS - (MAY REPEAT IN 5-15 MINUTES IF NEEDED; MORE THAN 2 DOSES SHOULD ONLY BE ADMINISTERED UNDER DIRECT MEDICAL SUPERVISION) 2 each 2   ezetimibe (ZETIA) 10 MG tablet Take 1 tablet (10 mg total) by mouth daily. 90  tablet 2   famotidine (PEPCID) 20 MG tablet TAKE ONE TABLET BY MOUTH EVERY DAY IN THE EVENING 90 tablet 0   fluconazole (DIFLUCAN) 100 MG tablet Take 1 tablet (100 mg total) by mouth daily. Take 1 tablet by mouth now repeat in 5 days 2 tablet 0   gabapentin (NEURONTIN) 300 MG capsule Take 1 capsule (300 mg total) by mouth 2 (two) times daily. 180 capsule 1   lidocaine (LIDODERM) 5 % Place 1 patch onto the skin daily as needed (back pain). Remove & Discard patch within 12 hours or as directed by MD 30 patch 1   loperamide (IMODIUM) 2 MG capsule Take 1 capsule (2 mg total) by mouth every 6 (six) hours as needed for diarrhea or loose stools. 30 capsule 0   Melatonin 5 MG TABS Take 5 mg by mouth at bedtime.     metFORMIN (GLUCOPHAGE-XR) 750 MG 24 hr tablet Take 1 tablet by mouth daily 90 tablet 1   montelukast (SINGULAIR) 10 MG tablet Take 1 tablet (10 mg total) by mouth at bedtime. 90 tablet 3   Multiple Vitamins-Minerals (ONE-A-DAY WOMENS PO) Take 1 tablet by mouth daily.     olmesartan (BENICAR) 5 MG tablet Take 1 tablet (5 mg total) by mouth daily. 90 tablet 1   omalizumab (XOLAIR) 300 MG/2  ML prefilled syringe Inject 375 mg into the skin every 14 (fourteen) days. 4 mL 11   omalizumab (XOLAIR) 75 MG/0.5ML prefilled syringe Inject 375 mg into the skin every 14 (fourteen) days. 1 mL 11   pantoprazole (PROTONIX) 40 MG tablet TAKE ONE TABLET BY MOUTH EVERY DAY 30 TO 60 MINUTES BEFORE FIRST MEAL OF THE DAY 90 tablet 1   sitaGLIPtin (JANUVIA) 100 MG tablet Take 1 tablet (100 mg total) by mouth daily. 90 tablet 1   Tetrahydrozoline HCl (VISINE OP) Place 1 drop into both eyes daily as needed (dry/itchy eyes).     Vitamin D, Ergocalciferol, (DRISDOL) 1.25 MG (50000 UNIT) CAPS capsule Take 1 capsule (50,000 Units total) by mouth every 7 (seven) days. 12 capsule 1   No current facility-administered medications for this visit.   Allergies: Allergies  Allergen Reactions   Tylenol [Acetaminophen] Itching     With codiene   Pollen Extract    Shellfish Allergy Hives   Peanut-Containing Drug Products Hives   I reviewed her past medical history, social history, family history, and environmental history and no significant changes have been reported from her previous visit.  Review of Systems  Constitutional:  Negative for appetite change, chills, fever and unexpected weight change.  HENT:  Negative for congestion, nosebleeds, rhinorrhea and sneezing.   Eyes:  Negative for itching.  Respiratory:  Positive for cough and shortness of breath. Negative for chest tightness and wheezing.   Cardiovascular:  Negative for chest pain.  Gastrointestinal:  Negative for abdominal pain.  Genitourinary:  Negative for difficulty urinating.  Skin:  Negative for rash.  Allergic/Immunologic: Positive for environmental allergies and food allergies.    Objective: There were no vitals taken for this visit. There is no height or weight on file to calculate BMI. Physical Exam Vitals and nursing note reviewed.  Constitutional:      Appearance: Normal appearance. She is well-developed.  HENT:     Head: Normocephalic and atraumatic.     Right Ear: Tympanic membrane and external ear normal.     Left Ear: Tympanic membrane and external ear normal.     Nose: Nose normal.     Mouth/Throat:     Mouth: Mucous membranes are dry.     Pharynx: Oropharynx is clear.  Eyes:     Conjunctiva/sclera: Conjunctivae normal.  Cardiovascular:     Rate and Rhythm: Normal rate and regular rhythm.     Heart sounds: Normal heart sounds. No murmur heard.    No friction rub. No gallop.  Pulmonary:     Effort: Pulmonary effort is normal.     Breath sounds: Normal breath sounds. No wheezing, rhonchi or rales.  Abdominal:     Palpations: Abdomen is soft.  Musculoskeletal:     Cervical back: Neck supple.  Skin:    General: Skin is warm.     Findings: No rash.  Neurological:     Mental Status: She is alert and oriented to  person, place, and time.  Psychiatric:        Behavior: Behavior normal.    Previous notes and tests were reviewed. The plan was reviewed with the patient/family, and all questions/concerned were addressed.  It was my pleasure to see [name] today and participate in her care. Please feel free to contact me with any questions or concerns.  Sincerely,  Wyline Mood, DO Allergy & Immunology  [facility] office: [phone] [facility] office: [phone]

## 2023-04-27 ENCOUNTER — Ambulatory Visit: Admitting: Allergy

## 2023-04-27 DIAGNOSIS — H1013 Acute atopic conjunctivitis, bilateral: Secondary | ICD-10-CM

## 2023-04-27 DIAGNOSIS — J301 Allergic rhinitis due to pollen: Secondary | ICD-10-CM

## 2023-04-27 DIAGNOSIS — K219 Gastro-esophageal reflux disease without esophagitis: Secondary | ICD-10-CM

## 2023-04-27 DIAGNOSIS — Z720 Tobacco use: Secondary | ICD-10-CM

## 2023-04-27 DIAGNOSIS — J3089 Other allergic rhinitis: Secondary | ICD-10-CM

## 2023-04-27 DIAGNOSIS — J4489 Other specified chronic obstructive pulmonary disease: Secondary | ICD-10-CM

## 2023-04-27 DIAGNOSIS — T781XXD Other adverse food reactions, not elsewhere classified, subsequent encounter: Secondary | ICD-10-CM

## 2023-04-29 ENCOUNTER — Other Ambulatory Visit: Payer: Self-pay | Admitting: Allergy

## 2023-04-29 ENCOUNTER — Other Ambulatory Visit: Payer: Self-pay | Admitting: Nurse Practitioner

## 2023-05-05 ENCOUNTER — Ambulatory Visit: Payer: Self-pay | Admitting: Nurse Practitioner

## 2023-05-13 LAB — BASIC METABOLIC PANEL WITH GFR
BUN: 9 (ref 4–21)
CO2: 20 (ref 13–22)
Chloride: 105 (ref 99–108)
Creatinine: 1 (ref 0.5–1.1)
Glucose: 123
Potassium: 4.6 meq/L (ref 3.5–5.1)
Sodium: 140 (ref 137–147)

## 2023-05-13 LAB — HEMOGLOBIN A1C: Hemoglobin A1C: 8.4

## 2023-05-13 LAB — MICROALBUMIN / CREATININE URINE RATIO: Microalb Creat Ratio: 37

## 2023-05-13 LAB — COMPREHENSIVE METABOLIC PANEL WITH GFR
Calcium: 9.1 (ref 8.7–10.7)
eGFR: 66

## 2023-05-13 LAB — PROTEIN / CREATININE RATIO, URINE
Albumin, U: 26.5
Creatinine, Urine: 71

## 2023-05-14 ENCOUNTER — Other Ambulatory Visit: Payer: Self-pay | Admitting: Nurse Practitioner

## 2023-05-14 ENCOUNTER — Encounter: Payer: Self-pay | Admitting: Nurse Practitioner

## 2023-06-18 ENCOUNTER — Encounter: Payer: Self-pay | Admitting: Nurse Practitioner

## 2023-06-18 ENCOUNTER — Ambulatory Visit: Admitting: Nurse Practitioner

## 2023-06-18 VITALS — BP 130/80 | HR 78 | Temp 98.6°F | Ht 60.0 in | Wt 136.4 lb

## 2023-06-18 DIAGNOSIS — E1143 Type 2 diabetes mellitus with diabetic autonomic (poly)neuropathy: Secondary | ICD-10-CM

## 2023-06-18 DIAGNOSIS — Z23 Encounter for immunization: Secondary | ICD-10-CM | POA: Diagnosis not present

## 2023-06-18 DIAGNOSIS — I10 Essential (primary) hypertension: Secondary | ICD-10-CM

## 2023-06-18 DIAGNOSIS — I129 Hypertensive chronic kidney disease with stage 1 through stage 4 chronic kidney disease, or unspecified chronic kidney disease: Secondary | ICD-10-CM

## 2023-06-18 DIAGNOSIS — F419 Anxiety disorder, unspecified: Secondary | ICD-10-CM

## 2023-06-18 DIAGNOSIS — Z1231 Encounter for screening mammogram for malignant neoplasm of breast: Secondary | ICD-10-CM

## 2023-06-18 DIAGNOSIS — E782 Mixed hyperlipidemia: Secondary | ICD-10-CM

## 2023-06-18 DIAGNOSIS — I700 Atherosclerosis of aorta: Secondary | ICD-10-CM

## 2023-06-18 DIAGNOSIS — N1831 Chronic kidney disease, stage 3a: Secondary | ICD-10-CM | POA: Diagnosis not present

## 2023-06-18 DIAGNOSIS — R252 Cramp and spasm: Secondary | ICD-10-CM

## 2023-06-18 DIAGNOSIS — F17210 Nicotine dependence, cigarettes, uncomplicated: Secondary | ICD-10-CM

## 2023-06-18 LAB — LIPID PANEL
Chol/HDL Ratio: 2 ratio (ref 0.0–4.4)
Cholesterol, Total: 116 mg/dL (ref 100–199)
HDL: 59 mg/dL (ref >39)
LDL Chol Calc (NIH): 40 mg/dL (ref 0–99)
Triglycerides: 91 mg/dL (ref 0–149)
VLDL Cholesterol Cal: 17 mg/dL (ref 5–40)

## 2023-06-18 MED ORDER — CYCLOBENZAPRINE HCL 10 MG PO TABS: 10.0000 mg | ORAL_TABLET | Freq: Three times a day (TID) | ORAL | 2 refills | Status: DC | PRN

## 2023-06-18 MED ORDER — BUSPIRONE HCL 10 MG PO TABS
10.0000 mg | ORAL_TABLET | Freq: Every day | ORAL | 1 refills | Status: DC
Start: 2023-06-18 — End: 2024-01-06

## 2023-06-18 NOTE — Progress Notes (Signed)
 [place], CMA,acting as a Neurosurgeon for Joyce Epley, [name].,have documented all relevant documentation on the behalf of Joyce Epley, [name],as directed by  Joyce Epley, [name] while in the presence of Joyce Epley, [name].  Subjective:  Patient ID: [name] , female    DOB: [birth year] , 63 y.o.   MRN: [id number]  Chief Complaint  Patient presents with   Hypertension    HPI  Patient presents today for a bp and dm follow up, Patient reports compliance with medication. Patient denies any chest pain, SOB, or headaches. Patient is taking blood pressure control medications as ordered, no concerns.  She is not checking her BP consistently, but does check it when she has lightheadedness, as that is a sign that it is low.  She has eliminated salt from her diet and when she has extra salt she gets a headache, and her BP is usually higher, 140-150 systolic.    Taking DM medications as prescribed, no issues with low are high readings, uses libre 3 to monitor continuously.  She is following a low carb, low salt diet.  She continues the PT program for exercise.  She has not been drinking as much water due to urinating too much, she is encouraged to drink at least 64 oz of water daily to promote kidney function and maintain level blood pressure readings.    Patient expresses concern over decreased visual acuity and would like referral to ophthalmologist, she has not been since 2023. Discussed importance of regular eye exams in the presence of diabetes and hypertension.  Mammogram also due and in July, order placed.         Hypertension This is a chronic problem. The current episode started more than 1 year ago. The problem has been gradually improving since onset. The problem is controlled. Pertinent negatives include no chest pain, headaches, palpitations or shortness of breath. There are no associated agents to hypertension. Risk factors for coronary artery disease include diabetes mellitus, family  history, obesity, smoking/tobacco exposure and stress. Past treatments include calcium  channel blockers and ACE inhibitors. The current treatment provides significant improvement. There are no compliance problems.  Hypertensive end-organ damage includes kidney disease and PVD. Identifiable causes of hypertension include chronic renal disease and a hypertension causing med.  Diabetes She presents for her follow-up diabetic visit. She has type 2 diabetes mellitus. No MedicAlert identification noted. Her disease course has been stable. There are no hypoglycemic associated symptoms. Pertinent negatives for hypoglycemia include no dizziness or headaches. There are no diabetic associated symptoms. Pertinent negatives for diabetes include no chest pain and no fatigue. There are no hypoglycemic complications. Symptoms are stable. Diabetic complications include nephropathy, peripheral neuropathy and PVD. Risk factors for coronary artery disease include diabetes mellitus, hypertension, stress, dyslipidemia, family history, obesity and tobacco exposure. Current diabetic treatment includes diet and oral agent (dual therapy). She is compliant with treatment all of the time. Her weight is stable. She is following a low salt, generally healthy and diabetic diet. Meal planning includes avoidance of concentrated sweets. She participates in exercise three times a week. An ACE inhibitor/angiotensin II receptor blocker is being taken. She does not see a podiatrist.Eye exam is not current.     Past Medical History:  Diagnosis Date   Acute kidney failure with lesion of tubular necrosis (HCC) 11/11/2016   Anxiety    Back pain    Dehydration 07/19/2017   Diabetes mellitus    Dyspnea 07/20/2017   Epistaxis 04/28/2018   Essential  hypertension 11/11/2016   Fibromyalgia    High cholesterol    Hypertension    Hypomagnesemia 07/20/2017   Pancreatitis 07/19/2017   Periumbilical pain 05/27/2022   STRESS FRACTURE, FOOT  05/25/2008   Qualifier: Diagnosis of   By: Serina Dane MD, Thurston Flow      Replacing diagnoses that were inactivated after the 05/26/22 regulatory import     Syncope 11/11/2016     Family History  Problem Relation Age of Onset   Allergic rhinitis Mother    Hypertension Mother    Heart failure Mother    Allergic rhinitis Father    Diabetes Father    COPD Father    Emphysema Father    Heart failure Maternal Grandmother    Cancer Maternal Grandfather        LUNG    Heart disease Maternal Grandfather    Lung cancer Maternal Grandfather    Bronchitis Daughter        TWICE YEARLY   Allergic rhinitis Daughter    Bronchitis Son        YEARLY   Allergic rhinitis Son    Food Allergy Grandchild    Asthma Neg Hx    Immunodeficiency Neg Hx    Eczema Neg Hx    Atopy Neg Hx    Angioedema Neg Hx      Current Outpatient Medications:    acetaminophen  (TYLENOL ) 500 MG tablet, Take 1,000 mg by mouth every 6 (six) hours as needed for moderate pain or headache., Disp: , Rfl:    albuterol  (PROVENTIL ) (2.5 MG/3ML) 0.083% nebulizer solution, Take 3 mLs (2.5 mg total) by nebulization every 4 (four) hours as needed for wheezing or shortness of breath (coughing fits)., Disp: 75 mL, Rfl: 1   albuterol  (VENTOLIN  HFA) 108 (90 Base) MCG/ACT inhaler, INHALE TWO PUFFS BY MOUTH EVERY 4 HOURS AS NEEDED FOR WHEEZING OR SHORTNESS OF BREATH (COUGHING FITS), Disp: 1 each, Rfl: 1   alum & mag hydroxide-simeth (MAALOX/MYLANTA) 200-200-20 MG/5ML suspension, Take 15 mLs by mouth every 4 (four) hours as needed for indigestion or heartburn., Disp: 355 mL, Rfl: 0   aspirin  81 MG chewable tablet, Chew 81 mg by mouth daily., Disp: , Rfl:    benzonatate  (TESSALON ) 100 MG capsule, Take 1 capsule (100 mg total) by mouth daily as needed for cough., Disp: 30 capsule, Rfl: 0   Blood Glucose Monitoring Suppl KIT, 1 each by Does not apply route 4 (four) times daily., Disp: 1 kit, Rfl: 0   Blood Pressure Monitoring (BLOOD PRESSURE CUFF)  MISC, Use to check blood pressure twice a day., Disp: 1 each, Rfl: 1   Budeson-Glycopyrrol-Formoterol  (BREZTRI  AEROSPHERE) 160-9-4.8 MCG/ACT AERO, Inhale 2 puffs into the lungs in the morning and at bedtime. with spacer and rinse mouth afterwards., Disp: 10.7 g, Rfl: 5   budesonide  (PULMICORT ) 0.5 MG/2ML nebulizer solution, Take 2 mLs (0.5 mg total) by nebulization 2 (two) times daily as needed., Disp: 120 mL, Rfl: 2   CRESTOR  40 MG tablet, TAKE ONE TABLET BY MOUTH EVERY DAY, Disp: 90 tablet, Rfl: 1   DULoxetine  (CYMBALTA ) 60 MG capsule, Take 1 capsule (60 mg total) by mouth daily., Disp: 90 capsule, Rfl: 1   EPIPEN  2-PAK 0.3 MG/0.3ML SOAJ injection, INJECT 0.3MG  (ONE INJECTION) INTRAMUSCULARLY AS DIRECTED AS NEEDED FOR ANAPHYLAXIS - (MAY REPEAT IN 5-15 MINUTES IF NEEDED; MORE THAN 2 DOSES SHOULD ONLY BE ADMINISTERED UNDER DIRECT MEDICAL SUPERVISION), Disp: 2 each, Rfl: 2   ezetimibe  (ZETIA ) 10 MG tablet, Take 1 tablet (10 mg  total) by mouth daily., Disp: 90 tablet, Rfl: 2   famotidine  (PEPCID ) 20 MG tablet, TAKE ONE TABLET BY MOUTH EVERY DAY IN THE EVENING, Disp: 90 tablet, Rfl: 0   fluconazole  (DIFLUCAN ) 100 MG tablet, Take 1 tablet (100 mg total) by mouth daily. Take 1 tablet by mouth now repeat in 5 days, Disp: 2 tablet, Rfl: 0   gabapentin  (NEURONTIN ) 300 MG capsule, Take 1 capsule (300 mg total) by mouth 2 (two) times daily., Disp: 180 capsule, Rfl: 1   lidocaine  (LIDODERM ) 5 %, APPLY 1 PATCH TO AFFECTED AREA EVERY DAY AS NEEDED FOR BACK PAIN. REMOVE AND DISCARD PATCH WITHIN 12 HOURS OR AS DIRECTED BY MD. (12 HOURS ON-12 HOURS OFF), Disp: 30 patch, Rfl: 1   loperamide  (IMODIUM ) 2 MG capsule, Take 1 capsule (2 mg total) by mouth every 6 (six) hours as needed for diarrhea or loose stools., Disp: 30 capsule, Rfl: 0   Melatonin 5 MG TABS, Take 5 mg by mouth at bedtime., Disp: , Rfl:    metFORMIN  (GLUCOPHAGE -XR) 750 MG 24 hr tablet, Take 1 tablet by mouth daily, Disp: 90 tablet, Rfl: 1   montelukast   (SINGULAIR ) 10 MG tablet, Take 1 tablet (10 mg total) by mouth at bedtime., Disp: 90 tablet, Rfl: 3   Multiple Vitamins-Minerals (ONE-A-DAY WOMENS PO), Take 1 tablet by mouth daily., Disp: , Rfl:    olmesartan  (BENICAR ) 5 MG tablet, Take 1 tablet (5 mg total) by mouth daily., Disp: 90 tablet, Rfl: 1   omalizumab  (XOLAIR ) 300 MG/2  ML prefilled syringe, Inject 375 mg into the skin every 14 (fourteen) days., Disp: 4 mL, Rfl: 11   omalizumab  (XOLAIR ) 75 MG/0.5ML prefilled syringe, Inject 375 mg into the skin every 14 (fourteen) days., Disp: 1 mL, Rfl: 11   pantoprazole  (PROTONIX ) 40 MG tablet, TAKE ONE TABLET BY MOUTH EVERY DAY 30 TO 60 MINUTES BEFORE FIRST MEAL OF THE DAY, Disp: 90 tablet, Rfl: 1   sitaGLIPtin  (JANUVIA ) 100 MG tablet, Take 1 tablet (100 mg total) by mouth daily., Disp: 90 tablet, Rfl: 1   Tetrahydrozoline HCl (VISINE OP), Place 1 drop into both eyes daily as needed (dry/itchy eyes)., Disp: , Rfl:    Vitamin D , Ergocalciferol , (DRISDOL ) 1.25 MG (50000 UNIT) CAPS capsule, TAKE ONE CAPSULE BY MOUTH EVERY SEVEN DAYS (MAY CONTAIN SOY OR PEANUT--TALK TO YOUR DOCTOR BEFORE TAKING IF YOU ARE ALLERGIC), Disp: 12 capsule, Rfl: 1   busPIRone  (BUSPAR ) 10 MG tablet, Take 1 tablet (10 mg total) by mouth daily., Disp: 90 tablet, Rfl: 1   cyclobenzaprine  (FLEXERIL ) 10 MG tablet, Take 1 tablet (10 mg total) by mouth 3 (three) times daily as needed., Disp: 30 tablet, Rfl: 2   Allergies  Allergen Reactions   Tylenol  [Acetaminophen ] Itching    With codiene   Pollen Extract    Shellfish Allergy Hives   Peanut-Containing Drug Products Hives     Review of Systems  Constitutional:  Negative for chills, fatigue and fever.  Respiratory:  Negative for cough and shortness of breath.   Cardiovascular:  Negative for chest pain, palpitations and leg swelling.  Gastrointestinal:  Negative for constipation, diarrhea and nausea.  Genitourinary:  Negative for urgency.  Musculoskeletal:  Positive for myalgias  (generalized, post PT sessions).  Neurological:  Negative for dizziness, light-headedness and headaches.  All other systems reviewed and are negative.    Today's Vitals   06/18/23 0848  BP: 130/80  Pulse: 78  Temp: 98.6 F (37 C)  TempSrc: Oral  Weight:  136 lb 6.4 oz (61.9 kg)  Height: 5' (1.524 m)  PainSc: 0-No pain   Body mass index is 26.64 kg/m.  Wt Readings from Last 3 Encounters:  06/18/23 136 lb 6.4 oz (61.9 kg)  01/26/23 143 lb 3.2 oz (65 kg)  01/07/23 142 lb 3.2 oz (64.5 kg)    Objective:  Physical Exam Constitutional:      General: She is not in acute distress.    Appearance: Normal appearance.  HENT:     Head: Normocephalic and atraumatic.  Cardiovascular:     Rate and Rhythm: Normal rate and regular rhythm.     Pulses: Normal pulses.     Heart sounds: Normal heart sounds.  Pulmonary:     Effort: Pulmonary effort is normal.     Breath sounds: Normal breath sounds.  Musculoskeletal:     Right lower leg: No edema.     Left lower leg: No edema.  Skin:    General: Skin is warm and dry.     Capillary Refill: Capillary refill takes less than 2 seconds.  Neurological:     General: No focal deficit present.     Mental Status: She is alert and oriented to person, place, and time. Mental status is at baseline.  Psychiatric:        Mood and Affect: Mood normal.        Behavior: Behavior normal.        Thought Content: Thought content normal.        Judgment: Judgment normal.     Title   Diabetic Foot Exam - detailed Is there a history of foot ulcer?: No Is there a foot ulcer now?: No Is there swelling?: No Is there elevated skin temperature?: No Is there abnormal foot shape?: No Is there a claw toe deformity?: No Are the toenails long?: No Are the toenails thick?: No Are the toenails ingrown?: No Is the skin thin, fragile, shiny and hairless?": No Normal Range of Motion?: No Is there foot or ankle muscle weakness?: No Do you have pain in calf while  walking?: No Are the shoes appropriate in style and fit?: Yes Can the patient see the bottom of their feet?: Yes Pulse Foot Exam completed.: Yes   Right Posterior Tibialis: Present Left posterior Tibialis: Present   Right Dorsalis Pedis: Present Left Dorsalis Pedis: Present     Sensory Foot Exam Completed.: Yes Semmes-Weinstein Monofilament Test "+" means "has sensation" and "-" means "no sensation"   R Site 1-Great Toe: Pos L Site 1-Great Toe: Pos   R site 5: Pos L Site 5: Pos  R Site 6: Pos L Site 6: Pos     Image components are not supported.   Image components are not supported. Image components are not supported.  [place] Comments           Assessment And Plan:  Benign hypertension with stage 3a chronic kidney disease (HCC) Assessment & Plan: Blood pressure is well-controlled.  Continue current medications.   Mixed hyperlipidemia Assessment & Plan: Cholesterol levels are stable.  Continue statin, tolerating well.  Orders: -     Lipid panel  Type II diabetes mellitus with peripheral autonomic neuropathy [facility]) Assessment & Plan: Continue follow-up with endocrinology.  Continue current medications she is now on [place] and NovoLog  sliding scale in addition to Januvia  and metformin .  Orders: -     Ambulatory referral to Ophthalmology  Aortic atherosclerosis [facility]) Assessment & Plan: Continue statin   Cramps, muscle, general  Assessment & Plan: She is to take magnesium  as needed.  Will continue to monitor as she is taking a statin.  Orders: -     Cyclobenzaprine  HCl; Take 1 tablet (10 mg total) by mouth 3 (three) times daily as needed.  Dispense: 30 tablet; Refill: 2  Cigarette nicotine  dependence without complication Assessment & Plan: Will repeat her CT low dose lung scan  Orders: -     CT CHEST LUNG CANCER SCREENING LOW DOSE WO CONTRAST; Future  Anxiety Assessment & Plan: She reports she feels better.   Orders: -     busPIRone  HCl; Take 1  tablet (10 mg total) by mouth daily.  Dispense: 90 tablet; Refill: 1  Need for zoster vaccination Assessment & Plan: Shingrix  done in office checked TransRx  Orders: -     Varicella-zoster vaccine IM  Encounter for screening mammogram for breast cancer Assessment & Plan: Pt instructed on Self Breast Exam.According to ACOG guidelines Women aged 26 and older are recommended to get an annual mammogram.   Orders: -     3D Screening Mammogram, Left and Right; Future    Return in 6 months (on 12/18/2023) for physical.  Patient was given opportunity to ask questions. Patient verbalized understanding of the plan and was able to repeat key elements of the plan. All questions were answered to their satisfaction.   I have reviewed this encounter including the documentation in this note and/or discussed this patient with Mickael Alamo, [name]-Student. I am certifying that I agree with the content of this note as the primary care nurse practitioner.  Joyce Epley, DNP, [name]-BC   I, Joyce Epley, [name], have reviewed all documentation for this visit. The documentation on 06/18/23 for the exam, diagnosis, procedures, and orders are all accurate and complete.   IF YOU HAVE BEEN REFERRED TO A SPECIALIST, IT MAY TAKE 1-2 WEEKS TO SCHEDULE/PROCESS THE REFERRAL. IF YOU HAVE NOT HEARD FROM US /SPECIALIST IN TWO WEEKS, PLEASE GIVE US  A CALL AT [phone].

## 2023-06-19 ENCOUNTER — Encounter: Payer: Self-pay | Admitting: Nurse Practitioner

## 2023-06-22 ENCOUNTER — Telehealth: Payer: Self-pay

## 2023-06-22 NOTE — Telephone Encounter (Signed)
 I called and left pt a vm regarding her labs. YL,RMA

## 2023-06-29 ENCOUNTER — Ambulatory Visit
Admission: RE | Admit: 2023-06-29 | Discharge: 2023-06-29 | Disposition: A | Discharge status: Home / Self Care | Source: Ambulatory Visit | Attending: Nurse Practitioner | Admitting: Nurse Practitioner

## 2023-06-29 DIAGNOSIS — Z1231 Encounter for screening mammogram for malignant neoplasm of breast: Secondary | ICD-10-CM

## 2023-06-30 NOTE — Assessment & Plan Note (Signed)
Cholesterol levels are stable.  Continue statin, tolerating well.

## 2023-06-30 NOTE — Assessment & Plan Note (Signed)
 Blood pressure is well controlled. Continue current medications.

## 2023-06-30 NOTE — Assessment & Plan Note (Signed)
 Will repeat her CT low dose lung scan

## 2023-06-30 NOTE — Assessment & Plan Note (Signed)
 She is to take magnesium as needed.  Will continue to monitor as she is taking a statin.

## 2023-06-30 NOTE — Assessment & Plan Note (Signed)
 Continue follow-up with endocrinology.  Continue current medications she is now on [place] and NovoLog sliding scale in addition to Januvia and metformin.

## 2023-06-30 NOTE — Assessment & Plan Note (Signed)
 Shingrix  done in office checked TransRx

## 2023-06-30 NOTE — Assessment & Plan Note (Signed)
 Continue statin.

## 2023-06-30 NOTE — Assessment & Plan Note (Signed)
Pt instructed on Self Breast Exam.According to ACOG guidelines Women aged 64 and older are recommended to get an annual mammogram.   

## 2023-06-30 NOTE — Assessment & Plan Note (Signed)
 She reports she feels better.

## 2023-07-02 DIAGNOSIS — N1831 Chronic kidney disease, stage 3a: Secondary | ICD-10-CM | POA: Insufficient documentation

## 2023-07-02 NOTE — Progress Notes (Signed)
 Updated

## 2023-07-14 ENCOUNTER — Inpatient Hospital Stay: Admission: RE | Admit: 2023-07-14 | Source: Ambulatory Visit

## 2023-07-24 ENCOUNTER — Inpatient Hospital Stay: Admission: RE | Admit: 2023-07-24 | Source: Ambulatory Visit

## 2023-08-02 NOTE — Progress Notes (Deleted)
 Follow Up Note  RE: [name] MRN: [id number] DOB: [birth year] Date of Office Visit: 08/03/2023  Referring provider: [name], [name] Primary care provider: [name], [name]  Chief Complaint: No chief complaint on file.  History of Present Illness: I had the pleasure of seeing [name] for a follow up visit at the [facility] on 08/02/2023. She is a 63 y.o. female, who is being followed for asthma/COPD, tobacco user, allergic rhinoconjunctivitis, GERD, adverse reaction. Her previous allergy office visit was on 01/26/2023 with Dr. Burdette Carolin. Today is a regular follow up visit.  Discussed the use of AI scribe software for clinical note transcription with the patient, who gave verbal consent to proceed.  History of Present Illness            Did she restart Xolair ?  Assessment and Plan: [name] is a 63 y.o. female with: Asthma-COPD overlap syndrome (HCC) Tobacco user Past history - 30+ year smoking history and follows with pulmonology. Eos 200, IgE 427. 2022 spirometry showed: restrictive disease with 11% improvement in FEV1 post bronchodilator treatment. Clinically feeling improved. Started Xolair  375mg  every 2 weeks in August 2023. Trelegy irritates her mouth.  Interim history - not controlled and still smoking. No prednisone . Tezspire  not covered. Prefers Breztri . Today's spirometry showed some restriction. Try to decrease smoking - smoke 1 less cigarette per week. Daily controller medication(s): continue Breztri  2 puffs twice a day with spacer and rinse mouth afterwards. Start budesonide  0.5mg  nebulizer twice a day.  Restart Xolair  375mg  every 2 weeks.  During respiratory infections/flares:  Pretreat with albuterol  2 puffs or albuterol  nebulizer.  If you need to use your albuterol  nebulizer machine back to back within 15-30 minutes with no relief then please go to the ER/urgent care for further evaluation.  May use albuterol  rescue inhaler 2 puffs or  nebulizer every 4 to 6 hours as needed for shortness of breath, chest tightness, coughing, and wheezing. May use albuterol  rescue inhaler 2 puffs 5 to 15 minutes prior to strenuous physical activities. Monitor frequency of use - if you need to use it more than twice per week on a consistent basis let us  know.    Allergic rhinitis due to dust mite Seasonal allergic rhinitis due to pollen Allergic conjunctivitis of both eyes Past history - On AIT briefly but stopped due to hives and tingling sensation in the face. 2022 skin testing positive to grass, ragweed, dust mites and feathers. Declines nasal sprays.  Interim history - Increased sneezing, currently managed with over-the-counter antihistamines and Singulair . Continue environmental control measures. Continue Singulair  (montelukast ) 10mg  daily at night. Use over the counter antihistamines such as Zyrtec (cetirizine), Claritin (loratadine), Allegra (fexofenadine), or Xyzal (levocetirizine) daily as needed. May take twice a day during allergy flares. May switch antihistamines every few months.   Gastroesophageal reflux disease, unspecified whether esophagitis present Stable. Unable to wean off PPI. Continue lifestyle and dietary modifications. Continue famotidine  20mg  daily at night. Continue Protonix  40mg  daily in the morning. Nothing to eat/drink for 30 minutes afterwards.    Other adverse food reactions, not elsewhere classified, subsequent encounter Past history - Shellfish caused tingling on her face, some finned fish cause diarrhea, peanuts cause tingling and hives, sesame seeds and poppy seeds cause hives. 2022 skin testing negative to select foods. 2022 bloodwork positive to shrimp only. Borderline positive to clam, scallop and lobster. Negative to fish panel, tree nuts, peanuts, sesame seed and poppy seed. Not interested in food challenges. Tolerates finned  fish.  Continue to avoid shellfish, peanuts, sesame seeds, poppy seeds. For mild  symptoms you can take over the counter antihistamines such as Benadryl 1-2 tablets = 25-50mg  and monitor symptoms closely. If symptoms worsen or if you have severe symptoms including breathing issues, throat closure, significant swelling, whole body hives, severe diarrhea and vomiting, lightheadedness then inject epinephrine  and seek immediate medical care afterwards. Emergency action plan in place.  Assessment and Plan              No follow-ups on file.  No orders of the defined types were placed in this encounter.  Lab Orders  No laboratory test(s) ordered today    Diagnostics: Spirometry:  Tracings reviewed. Her effort: {Blank single:19197::"Good reproducible efforts.","It was hard to get consistent efforts and there is a question as to whether this reflects a maximal maneuver.","Poor effort, data can not be interpreted."} FVC: ***L FEV1: ***L, ***% predicted FEV1/FVC ratio: ***% Interpretation: {Blank single:19197::"Spirometry consistent with mild obstructive disease","Spirometry consistent with moderate obstructive disease","Spirometry consistent with severe obstructive disease","Spirometry consistent with possible restrictive disease","Spirometry consistent with mixed obstructive and restrictive disease","Spirometry uninterpretable due to technique","Spirometry consistent with normal pattern","No overt abnormalities noted given today's efforts"}.  Please see scanned spirometry results for details.  Skin Testing: {Blank single:19197::"Select foods","Environmental allergy panel","Environmental allergy panel and select foods","Food allergy panel","None","Deferred due to recent antihistamines use"}. *** Results discussed with patient/family.   Medication List:  Current Outpatient Medications  Medication Sig Dispense Refill  . acetaminophen  (TYLENOL ) 500 MG tablet Take 1,000 mg by mouth every 6 (six) hours as needed for moderate pain or headache.    . albuterol  (PROVENTIL ) (2.5  MG/3ML) 0.083% nebulizer solution Take 3 mLs (2.5 mg total) by nebulization every 4 (four) hours as needed for wheezing or shortness of breath (coughing fits). 75 mL 1  . albuterol  (VENTOLIN  HFA) 108 (90 Base) MCG/ACT inhaler INHALE TWO PUFFS BY MOUTH EVERY 4 HOURS AS NEEDED FOR WHEEZING OR SHORTNESS OF BREATH (COUGHING FITS) 1 each 1  . alum & mag hydroxide-simeth (MAALOX/MYLANTA) 200-200-20 MG/5ML suspension Take 15 mLs by mouth every 4 (four) hours as needed for indigestion or heartburn. 355 mL 0  . aspirin  81 MG chewable tablet Chew 81 mg by mouth daily.    . benzonatate  (TESSALON ) 100 MG capsule Take 1 capsule (100 mg total) by mouth daily as needed for cough. 30 capsule 0  . Blood Glucose Monitoring Suppl KIT 1 each by Does not apply route 4 (four) times daily. 1 kit 0  . Blood Pressure Monitoring (BLOOD PRESSURE CUFF) MISC Use to check blood pressure twice a day. 1 each 1  . Budeson-Glycopyrrol-Formoterol  (BREZTRI  AEROSPHERE) 160-9-4.8 MCG/ACT AERO Inhale 2 puffs into the lungs in the morning and at bedtime. with spacer and rinse mouth afterwards. 10.7 g 5  . budesonide  (PULMICORT ) 0.5 MG/2ML nebulizer solution Take 2 mLs (0.5 mg total) by nebulization 2 (two) times daily as needed. 120 mL 2  . busPIRone  (BUSPAR ) 10 MG tablet Take 1 tablet (10 mg total) by mouth daily. 90 tablet 1  . CRESTOR  40 MG tablet TAKE ONE TABLET BY MOUTH EVERY DAY 90 tablet 1  . cyclobenzaprine  (FLEXERIL ) 10 MG tablet Take 1 tablet (10 mg total) by mouth 3 (three) times daily as needed. 30 tablet 2  . DULoxetine  (CYMBALTA ) 60 MG capsule Take 1 capsule (60 mg total) by mouth daily. 90 capsule 1  . EPIPEN  2-PAK 0.3 MG/0.3ML SOAJ injection INJECT 0.3MG  (ONE INJECTION) INTRAMUSCULARLY AS DIRECTED AS NEEDED FOR ANAPHYLAXIS - (  MAY REPEAT IN 5-15 MINUTES IF NEEDED; MORE THAN 2 DOSES SHOULD ONLY BE ADMINISTERED UNDER DIRECT MEDICAL SUPERVISION) 2 each 2  . ezetimibe  (ZETIA ) 10 MG tablet Take 1 tablet (10 mg total) by mouth  daily. 90 tablet 2  . famotidine  (PEPCID ) 20 MG tablet TAKE ONE TABLET BY MOUTH EVERY DAY IN THE EVENING 90 tablet 0  . fluconazole  (DIFLUCAN ) 100 MG tablet Take 1 tablet (100 mg total) by mouth daily. Take 1 tablet by mouth now repeat in 5 days 2 tablet 0  . gabapentin  (NEURONTIN ) 300 MG capsule Take 1 capsule (300 mg total) by mouth 2 (two) times daily. 180 capsule 1  . lidocaine  (LIDODERM ) 5 % APPLY 1 PATCH TO AFFECTED AREA EVERY DAY AS NEEDED FOR BACK PAIN. REMOVE AND DISCARD PATCH WITHIN 12 HOURS OR AS DIRECTED BY MD. (12 HOURS ON-12 HOURS OFF) 30 patch 1  . loperamide  (IMODIUM ) 2 MG capsule Take 1 capsule (2 mg total) by mouth every 6 (six) hours as needed for diarrhea or loose stools. 30 capsule 0  . Melatonin 5 MG TABS Take 5 mg by mouth at bedtime.    . metFORMIN  (GLUCOPHAGE -XR) 750 MG 24 hr tablet Take 1 tablet by mouth daily 90 tablet 1  . montelukast  (SINGULAIR ) 10 MG tablet Take 1 tablet (10 mg total) by mouth at bedtime. 90 tablet 3  . Multiple Vitamins-Minerals (ONE-A-DAY WOMENS PO) Take 1 tablet by mouth daily.    . olmesartan  (BENICAR ) 5 MG tablet Take 1 tablet (5 mg total) by mouth daily. 90 tablet 1  . omalizumab  (XOLAIR ) 300 MG/2  ML prefilled syringe Inject 375 mg into the skin every 14 (fourteen) days. 4 mL 11  . omalizumab  (XOLAIR ) 75 MG/0.5ML prefilled syringe Inject 375 mg into the skin every 14 (fourteen) days. 1 mL 11  . pantoprazole  (PROTONIX ) 40 MG tablet TAKE ONE TABLET BY MOUTH EVERY DAY 30 TO 60 MINUTES BEFORE FIRST MEAL OF THE DAY 90 tablet 1  . sitaGLIPtin  (JANUVIA ) 100 MG tablet Take 1 tablet (100 mg total) by mouth daily. 90 tablet 1  . Tetrahydrozoline HCl (VISINE OP) Place 1 drop into both eyes daily as needed (dry/itchy eyes).    . Vitamin D , Ergocalciferol , (DRISDOL ) 1.25 MG (50000 UNIT) CAPS capsule TAKE ONE CAPSULE BY MOUTH EVERY SEVEN DAYS (MAY CONTAIN SOY OR PEANUT--TALK TO YOUR DOCTOR BEFORE TAKING IF YOU ARE ALLERGIC) 12 capsule 1   No current  facility-administered medications for this visit.   Allergies: Allergies  Allergen Reactions  . Tylenol  [Acetaminophen ] Itching    With codiene  . Pollen Extract   . Shellfish Allergy Hives  . Peanut-Containing Drug Products Hives   I reviewed her past medical history, social history, family history, and environmental history and no significant changes have been reported from her previous visit.  Review of Systems  Constitutional:  Negative for appetite change, chills, fever and unexpected weight change.  HENT:  Negative for congestion, nosebleeds, rhinorrhea and sneezing.   Eyes:  Negative for itching.  Respiratory:  Positive for cough and shortness of breath. Negative for chest tightness and wheezing.   Cardiovascular:  Negative for chest pain.  Gastrointestinal:  Negative for abdominal pain.  Genitourinary:  Negative for difficulty urinating.  Skin:  Negative for rash.  Allergic/Immunologic: Positive for environmental allergies and food allergies.   Objective: There were no vitals taken for this visit. There is no height or weight on file to calculate BMI. Physical Exam Vitals and nursing note reviewed.  Constitutional:      Appearance: Normal appearance. She is well-developed.  HENT:     Head: Normocephalic and atraumatic.     Right Ear: Tympanic membrane and external ear normal.     Left Ear: Tympanic membrane and external ear normal.     Nose: Nose normal.     Mouth/Throat:     Mouth: Mucous membranes are dry.     Pharynx: Oropharynx is clear.  Eyes:     Conjunctiva/sclera: Conjunctivae normal.  Cardiovascular:     Rate and Rhythm: Normal rate and regular rhythm.     Heart sounds: Normal heart sounds. No murmur heard.    No friction rub. No gallop.  Pulmonary:     Effort: Pulmonary effort is normal.     Breath sounds: Normal breath sounds. No wheezing, rhonchi or rales.  Abdominal:     Palpations: Abdomen is soft.  Musculoskeletal:     Cervical back: Neck  supple.  Skin:    General: Skin is warm.     Findings: No rash.  Neurological:     Mental Status: She is alert and oriented to person, place, and time.  Psychiatric:        Behavior: Behavior normal.  Previous notes and tests were reviewed. The plan was reviewed with the patient/family, and all questions/concerned were addressed.  It was my pleasure to see [name] today and participate in her care. Please feel free to contact me with any questions or concerns.  Sincerely,  Eudelia Hero, DO Allergy & Immunology  [facility]  [facility] office: [phone] [facility] office: [phone]

## 2023-08-03 ENCOUNTER — Other Ambulatory Visit: Payer: Self-pay | Admitting: Nurse Practitioner

## 2023-08-03 ENCOUNTER — Ambulatory Visit: Admitting: Allergy

## 2023-08-03 DIAGNOSIS — H1013 Acute atopic conjunctivitis, bilateral: Secondary | ICD-10-CM

## 2023-08-03 DIAGNOSIS — J3089 Other allergic rhinitis: Secondary | ICD-10-CM

## 2023-08-03 DIAGNOSIS — K219 Gastro-esophageal reflux disease without esophagitis: Secondary | ICD-10-CM

## 2023-08-03 DIAGNOSIS — T781XXD Other adverse food reactions, not elsewhere classified, subsequent encounter: Secondary | ICD-10-CM

## 2023-08-03 DIAGNOSIS — E782 Mixed hyperlipidemia: Secondary | ICD-10-CM

## 2023-08-03 DIAGNOSIS — J4489 Other specified chronic obstructive pulmonary disease: Secondary | ICD-10-CM

## 2023-08-03 DIAGNOSIS — I10 Essential (primary) hypertension: Secondary | ICD-10-CM

## 2023-08-03 DIAGNOSIS — Z720 Tobacco use: Secondary | ICD-10-CM

## 2023-08-03 DIAGNOSIS — J301 Allergic rhinitis due to pollen: Secondary | ICD-10-CM

## 2023-08-25 ENCOUNTER — Ambulatory Visit
Admission: RE | Admit: 2023-08-25 | Discharge: 2023-08-25 | Disposition: A | Discharge status: Home / Self Care | Source: Ambulatory Visit | Attending: Nurse Practitioner | Admitting: Nurse Practitioner

## 2023-08-25 DIAGNOSIS — F17210 Nicotine dependence, cigarettes, uncomplicated: Secondary | ICD-10-CM

## 2023-09-04 ENCOUNTER — Ambulatory Visit: Admitting: Allergy

## 2023-09-04 NOTE — Progress Notes (Deleted)
 Follow Up Note  RE: [name] MRN: [id number] DOB: 03-Feb-1961 Date of Office Visit: 09/04/2023  Referring provider: Georgina Speaks, [name] Primary care provider: Georgina Speaks, [name]  Chief Complaint: No chief complaint on file.  History of Present Illness: I had the pleasure of seeing [name] for a follow up visit at the [facility] on 09/04/2023. She is a 63 y.o. female, who is being followed for asthma/COPD, allergic rhinoconjunctivitis, GERD, adverse food reaction. Her previous allergy office visit was on February 05, 2023 with Dr. Luke. Today is a regular follow up visit.  Discussed the use of AI scribe software for clinical note transcription with the patient, who gave verbal consent to proceed.  History of Present Illness            ***  Assessment and Plan: Alexy is a 63 y.o. female with: Asthma-COPD overlap syndrome (HCC) Tobacco user Past history - 30+ year smoking history and follows with pulmonology. Eos 200, IgE 427. 2022 spirometry showed: restrictive disease with 11% improvement in FEV1 post bronchodilator treatment. Clinically feeling improved. Started Xolair  375mg  every 2 weeks in August 2023. Trelegy irritates her mouth.  Interim history - not controlled and still smoking. No prednisone . Tezspire  not covered. Prefers Breztri . Today's spirometry showed some restriction. Try to decrease smoking - smoke 1 less cigarette per week. Daily controller medication(s): continue Breztri  2 puffs twice a day with spacer and rinse mouth afterwards. Start budesonide  0.5mg  nebulizer twice a day.  Restart Xolair  375mg  every 2 weeks.  During respiratory infections/flares:  Pretreat with albuterol  2 puffs or albuterol  nebulizer.  If you need to use your albuterol  nebulizer machine back to back within 15-30 minutes with no relief then please go to the ER/urgent care for further evaluation.  May use albuterol  rescue inhaler 2 puffs or nebulizer every 4 to  6 hours as needed for shortness of breath, chest tightness, coughing, and wheezing. May use albuterol  rescue inhaler 2 puffs 5 to 15 minutes prior to strenuous physical activities. Monitor frequency of use - if you need to use it more than twice per week on a consistent basis let us  know.    Allergic rhinitis due to dust mite Seasonal allergic rhinitis due to pollen Allergic conjunctivitis of both eyes Past history - On AIT briefly but stopped due to hives and tingling sensation in the face. 2022 skin testing positive to grass, ragweed, dust mites and feathers. Declines nasal sprays.  Interim history - Increased sneezing, currently managed with over-the-counter antihistamines and Singulair . Continue environmental control measures. Continue Singulair  (montelukast ) 10mg  daily at night. Use over the counter antihistamines such as Zyrtec (cetirizine), Claritin (loratadine), Allegra (fexofenadine), or Xyzal (levocetirizine) daily as needed. May take twice a day during allergy flares. May switch antihistamines every few months.   Gastroesophageal reflux disease, unspecified whether esophagitis present Stable. Unable to wean off PPI. Continue lifestyle and dietary modifications. Continue famotidine  20mg  daily at night. Continue Protonix  40mg  daily in the morning. Nothing to eat/drink for 30 minutes afterwards.    Other adverse food reactions, not elsewhere classified, subsequent encounter Past history - Shellfish caused tingling on her face, some finned fish cause diarrhea, peanuts cause tingling and hives, sesame seeds and poppy seeds cause hives. 2022 skin testing negative to select foods. 2022 bloodwork positive to shrimp only. Borderline positive to clam, scallop and lobster. Negative to fish panel, tree nuts, peanuts, sesame seed and poppy seed. Not interested in food challenges. Tolerates finned fish.  Continue to avoid shellfish, peanuts, sesame seeds, poppy seeds. For mild symptoms you can take  over the counter antihistamines such as Benadryl 1-2 tablets = 25-50mg  and monitor symptoms closely. If symptoms worsen or if you have severe symptoms including breathing issues, throat closure, significant swelling, whole body hives, severe diarrhea and vomiting, lightheadedness then inject epinephrine  and seek immediate medical care afterwards. Emergency action plan in place.  Assessment and Plan              No follow-ups on file.  No orders of the defined types were placed in this encounter.  Lab Orders  No laboratory test(s) ordered today    Diagnostics: Spirometry:  Tracings reviewed. Her effort: {Blank single:19197::Good reproducible efforts.,It was hard to get consistent efforts and there is a question as to whether this reflects a maximal maneuver.,Poor effort, data can not be interpreted.} FVC: ***L FEV1: ***L, ***% predicted FEV1/FVC ratio: ***% Interpretation: {Blank single:19197::Spirometry consistent with mild obstructive disease,Spirometry consistent with moderate obstructive disease,Spirometry consistent with severe obstructive disease,Spirometry consistent with possible restrictive disease,Spirometry consistent with mixed obstructive and restrictive disease,Spirometry uninterpretable due to technique,Spirometry consistent with normal pattern,No overt abnormalities noted given today's efforts}.  Please see scanned spirometry results for details.  Skin Testing: {Blank single:19197::Select foods,Environmental allergy panel,Environmental allergy panel and select foods,Food allergy panel,None,Deferred due to recent antihistamines use}. *** Results discussed with patient/family.   Medication List:  Current Outpatient Medications  Medication Sig Dispense Refill   acetaminophen  (TYLENOL ) 500 MG tablet Take 1,000 mg by mouth every 6 (six) hours as needed for moderate pain or headache.     albuterol  (PROVENTIL ) (2.5 MG/3ML) 0.083% nebulizer  solution Take 3 mLs (2.5 mg total) by nebulization every 4 (four) hours as needed for wheezing or shortness of breath (coughing fits). 75 mL 1   albuterol  (VENTOLIN  HFA) 108 (90 Base) MCG/ACT inhaler INHALE TWO PUFFS BY MOUTH EVERY 4 HOURS AS NEEDED FOR WHEEZING OR SHORTNESS OF BREATH (COUGHING FITS) 1 each 1   alum & mag hydroxide-simeth (MAALOX/MYLANTA) 200-200-20 MG/5ML suspension Take 15 mLs by mouth every 4 (four) hours as needed for indigestion or heartburn. 355 mL 0   aspirin  81 MG chewable tablet Chew 81 mg by mouth daily.     benzonatate  (TESSALON ) 100 MG capsule Take 1 capsule (100 mg total) by mouth daily as needed for cough. 30 capsule 0   Blood Glucose Monitoring Suppl KIT 1 each by Does not apply route 4 (four) times daily. 1 kit 0   Blood Pressure Monitoring (BLOOD PRESSURE CUFF) MISC Use to check blood pressure twice a day. 1 each 1   Budeson-Glycopyrrol-Formoterol  (BREZTRI  AEROSPHERE) 160-9-4.8 MCG/ACT AERO Inhale 2 puffs into the lungs in the morning and at bedtime. with spacer and rinse mouth afterwards. 10.7 g 5   budesonide  (PULMICORT ) 0.5 MG/2ML nebulizer solution Take 2 mLs (0.5 mg total) by nebulization 2 (two) times daily as needed. 120 mL 2   busPIRone  (BUSPAR ) 10 MG tablet Take 1 tablet (10 mg total) by mouth daily. 90 tablet 1   CRESTOR  40 MG tablet TAKE ONE TABLET BY MOUTH EVERY DAY 90 tablet 1   cyclobenzaprine  (FLEXERIL ) 10 MG tablet Take 1 tablet (10 mg total) by mouth 3 (three) times daily as needed. 30 tablet 2   DULoxetine  (CYMBALTA ) 60 MG capsule Take 1 capsule (60 mg total) by mouth daily. 90 capsule 1   EPIPEN  2-PAK 0.3 MG/0.3ML SOAJ injection INJECT 0.3MG  (ONE INJECTION) INTRAMUSCULARLY AS DIRECTED AS NEEDED FOR ANAPHYLAXIS - (MAY  REPEAT IN 5-15 MINUTES IF NEEDED; MORE THAN 2 DOSES SHOULD ONLY BE ADMINISTERED UNDER DIRECT MEDICAL SUPERVISION) 2 each 2   ezetimibe  (ZETIA ) 10 MG tablet TAKE ONE TABLET BY MOUTH EVERY DAY 90 tablet 2   famotidine  (PEPCID ) 20 MG  tablet TAKE ONE TABLET BY MOUTH EVERY DAY IN THE EVENING 90 tablet 0   fluconazole  (DIFLUCAN ) 100 MG tablet Take 1 tablet (100 mg total) by mouth daily. Take 1 tablet by mouth now repeat in 5 days 2 tablet 0   gabapentin  (NEURONTIN ) 300 MG capsule Take 1 capsule (300 mg total) by mouth 2 (two) times daily. 180 capsule 1   lidocaine  (LIDODERM ) 5 % APPLY 1 PATCH TO AFFECTED AREA EVERY DAY AS NEEDED FOR BACK PAIN. REMOVE AND DISCARD PATCH WITHIN 12 HOURS OR AS DIRECTED BY MD. (12 HOURS ON-12 HOURS OFF) 30 patch 1   loperamide  (IMODIUM ) 2 MG capsule Take 1 capsule (2 mg total) by mouth every 6 (six) hours as needed for diarrhea or loose stools. 30 capsule 0   Melatonin 5 MG TABS Take 5 mg by mouth at bedtime.     metFORMIN  (GLUCOPHAGE -XR) 750 MG 24 hr tablet Take 1 tablet by mouth daily 90 tablet 1   montelukast  (SINGULAIR ) 10 MG tablet Take 1 tablet (10 mg total) by mouth at bedtime. 90 tablet 3   Multiple Vitamins-Minerals (ONE-A-DAY WOMENS PO) Take 1 tablet by mouth daily.     olmesartan  (BENICAR ) 5 MG tablet TAKE ONE TABLET BY MOUTH EVERY DAY 90 tablet 1   omalizumab  (XOLAIR ) 300 MG/2  ML prefilled syringe Inject 375 mg into the skin every 14 (fourteen) days. 4 mL 11   omalizumab  (XOLAIR ) 75 MG/0.5ML prefilled syringe Inject 375 mg into the skin every 14 (fourteen) days. 1 mL 11   pantoprazole  (PROTONIX ) 40 MG tablet TAKE ONE TABLET BY MOUTH EVERY DAY 30 TO 60 MINUTES BEFORE FIRST MEAL OF THE DAY 90 tablet 1   sitaGLIPtin  (JANUVIA ) 100 MG tablet Take 1 tablet (100 mg total) by mouth daily. 90 tablet 1   Tetrahydrozoline HCl (VISINE OP) Place 1 drop into both eyes daily as needed (dry/itchy eyes).     Vitamin D , Ergocalciferol , (DRISDOL ) 1.25 MG (50000 UNIT) CAPS capsule TAKE ONE CAPSULE BY MOUTH EVERY SEVEN DAYS (MAY CONTAIN SOY OR PEANUT--TALK TO YOUR DOCTOR BEFORE TAKING IF YOU ARE ALLERGIC) 12 capsule 1   No current facility-administered medications for this visit.   Allergies: Allergies   Allergen Reactions   Tylenol  [Acetaminophen ] Itching    With codiene   Pollen Extract    Shellfish Allergy Hives   Peanut-Containing Drug Products Hives   I reviewed her past medical history, social history, family history, and environmental history and no significant changes have been reported from her previous visit.  Review of Systems  Constitutional:  Negative for appetite change, chills, fever and unexpected weight change.  HENT:  Negative for congestion, nosebleeds, rhinorrhea and sneezing.   Eyes:  Negative for itching.  Respiratory:  Positive for cough and shortness of breath. Negative for chest tightness and wheezing.   Cardiovascular:  Negative for chest pain.  Gastrointestinal:  Negative for abdominal pain.  Genitourinary:  Negative for difficulty urinating.  Skin:  Negative for rash.  Allergic/Immunologic: Positive for environmental allergies and food allergies.    Objective: There were no vitals taken for this visit. There is no height or weight on file to calculate BMI. Physical Exam Vitals and nursing note reviewed.  Constitutional:  Appearance: Normal appearance. She is well-developed.  HENT:     Head: Normocephalic and atraumatic.     Right Ear: Tympanic membrane and external ear normal.     Left Ear: Tympanic membrane and external ear normal.     Nose: Nose normal.     Mouth/Throat:     Mouth: Mucous membranes are dry.     Pharynx: Oropharynx is clear.  Eyes:     Conjunctiva/sclera: Conjunctivae normal.  Cardiovascular:     Rate and Rhythm: Normal rate and regular rhythm.     Heart sounds: Normal heart sounds. No murmur heard.    No friction rub. No gallop.  Pulmonary:     Effort: Pulmonary effort is normal.     Breath sounds: Normal breath sounds. No wheezing, rhonchi or rales.  Abdominal:     Palpations: Abdomen is soft.  Musculoskeletal:     Cervical back: Neck supple.  Skin:    General: Skin is warm.     Findings: No rash.  Neurological:      Mental Status: She is alert and oriented to person, place, and time.  Psychiatric:        Behavior: Behavior normal.    Previous notes and tests were reviewed. The plan was reviewed with the patient/family, and all questions/concerned were addressed.  It was my pleasure to see Mirayah today and participate in her care. Please feel free to contact me with any questions or concerns.  Sincerely,  Orlan Cramp, DO Allergy & Immunology  [facility]  [place] office: [phone] [facility] office: [phone]

## 2023-09-21 ENCOUNTER — Other Ambulatory Visit: Payer: Self-pay | Admitting: Nurse Practitioner

## 2023-09-21 DIAGNOSIS — R252 Cramp and spasm: Secondary | ICD-10-CM

## 2023-09-21 DIAGNOSIS — I700 Atherosclerosis of aorta: Secondary | ICD-10-CM

## 2023-10-06 NOTE — Progress Notes (Signed)
 Follow Up Note  RE: [name] MRN: [id number] DOB: 19-Apr-1960 Date of Office Visit: 10/07/2023  Referring provider: Georgina Speaks, [name] Primary care provider: Georgina Speaks, [name]  Chief Complaint: Follow-up (Asthma-COPD/State that she is slightly lightheaded when she experience shortness of breath x 2 to 3 times weekly.)  History of Present Illness: I had the pleasure of seeing [name] for a follow up visit at the [facility] on 10/07/2023. She is a 63 y.o. female, who is being followed for asthma/COPD, allergic rhinoconjunctivitis, GERD, adverse food reaction. Her previous allergy office visit was on January 26, 2023 with Dr. Luke. Today is a regular follow up visit.  Discussed the use of AI scribe software for clinical note transcription with the patient, who gave verbal consent to proceed.    She experiences shortness of breath primarily during overexertion at home or while walking around the house talking on the phone. This sometimes occurs when she gets excited. She uses her rescue inhaler daily, although shortness of breath occurs two to three times a week. She also uses her nebulizer as needed.  She is currently taking Breztri , two puffs twice a day, and montelukast  at night. She has not resumed any biologic treatments as she forgot to call back. There have been no recent emergency room or urgent care visits for her breathing issues.  She continues to smoke, although less than before.   Regarding her reflux, she reports it is going well. She is currently taking Protonix  in the morning and has stopped taking famotidine  at night - although she thinks she does need something extra to control her reflux.     Assessment and Plan: [name] is a 63 y.o. female with: Asthma-COPD overlap syndrome (HCC) Tobacco user Past history - 30+ year smoking history and follows with pulmonology. Eos 200, IgE 427. 2022 spirometry showed: restrictive disease with 11%  improvement in FEV1 post bronchodilator treatment. Clinically feeling improved. Started Xolair  375mg  every 2 weeks in August 2023 and then stopped. Trelegy irritates her mouth. [place]  not covered.  Interim history - didn't start Xolair . Still smoking. No prednisone . Coughing at times and requesting tessalon  perles.  Today's spirometry was unremarkable.  Try to decrease smoking - smoke 1 less cigarette per week. Daily controller medication(s): continue Breztri  2 puffs twice a day with spacer and rinse mouth afterwards. During respiratory infections/flares:  Start budesonide  (Pulmicort ) 0.5mg  nebulizer for 1-2 weeks until your breathing symptoms return to baseline.  Pretreat with albuterol  2 puffs or albuterol  nebulizer.  If you need to use your albuterol  nebulizer machine back to back within 15-30 minutes with no relief then please go to the ER/urgent care for further evaluation.  May use albuterol  rescue inhaler 2 puffs or nebulizer every 4 to 6 hours as needed for shortness of breath, chest tightness, coughing, and wheezing. May use albuterol  rescue inhaler 2 puffs 5 to 15 minutes prior to strenuous physical activities. Monitor frequency of use - if you need to use it more than twice per week on a consistent basis let us  know.  Get bloodwork to see if qualifies for Nucala which is now also approved for COPD and asthma.  Get flu shot in the fall.    Allergic rhinitis due to dust mite Seasonal allergic rhinitis due to pollen Allergic conjunctivitis of both eyes Past history - On AIT briefly but stopped due to hives and tingling sensation in the face. 2022 skin testing positive to grass, ragweed, dust mites and  feathers. Declines nasal sprays.  Interim history - stable.  Continue environmental control measures. Continue Singulair  (montelukast ) 10mg  daily at night. Use over the counter antihistamines such as Zyrtec (cetirizine), Claritin (loratadine), Allegra (fexofenadine), or Xyzal  (levocetirizine) daily as needed. May take twice a day during allergy flares. May switch antihistamines every few months.   Gastroesophageal reflux disease, unspecified whether esophagitis present Symptomatic but only taking Protonix  now.  Continue lifestyle and dietary modifications. Start famotidine  20mg  daily at night. Continue Protonix  40mg  daily in the morning. Nothing to eat/drink for 30 minutes afterwards.    Other adverse food reactions, not elsewhere classified, subsequent encounter Past history - Shellfish caused tingling on her face, some finned fish cause diarrhea, peanuts cause tingling and hives, sesame seeds and poppy seeds cause hives. 2022 skin testing negative to select foods. 2022 bloodwork positive to shrimp only. Borderline positive to clam, scallop and lobster. Negative to fish panel, tree nuts, peanuts, sesame seed and poppy seed. Not interested in food challenges. Tolerates finned fish.  Continue to avoid shellfish, peanuts, sesame seeds, poppy seeds. For mild symptoms you can take over the counter antihistamines (zyrtec 10mg  to 20mg ) and monitor symptoms closely.  If symptoms worsen or if you have severe symptoms including breathing issues, throat closure, significant swelling, whole body hives, severe diarrhea and vomiting, lightheadedness then use epinephrine  and seek immediate medical care afterwards. Emergency action plan in place.   Return in about 3 months (around 01/07/2024).  Meds ordered this encounter  Medications   budesonide -glycopyrrolate-formoterol  (BREZTRI  AEROSPHERE) 160-9-4.8 MCG/ACT AERO inhaler    Sig: Inhale 2 puffs into the lungs in the morning and at bedtime. with spacer and rinse mouth afterwards.    Dispense:  10.7 g    Refill:  5   budesonide  (PULMICORT ) 0.5 MG/2ML nebulizer solution    Sig: Take 2 mLs (0.5 mg total) by nebulization 2 (two) times daily as needed.    Dispense:  120 mL    Refill:  3   montelukast  (SINGULAIR ) 10 MG tablet     Sig: Take 1 tablet (10 mg total) by mouth at bedtime.    Dispense:  90 tablet    Refill:  3   EPINEPHrine  0.3 mg/0.3 mL IJ SOAJ injection    Sig: Inject 0.3 mg into the muscle as needed for anaphylaxis.    Dispense:  2 each    Refill:  1    May dispense generic/Mylan/Teva brand.   albuterol  (PROVENTIL ) (2.5 MG/3ML) 0.083% nebulizer solution    Sig: Take 3 mLs (2.5 mg total) by nebulization every 4 (four) hours as needed for wheezing or shortness of breath (coughing fits).    Dispense:  75 mL    Refill:  1   albuterol  (VENTOLIN  HFA) 108 (90 Base) MCG/ACT inhaler    Sig: Inhale 2 puffs into the lungs every 4 (four) hours as needed for wheezing or shortness of breath (coughing fits).    Dispense:  18 g    Refill:  1   famotidine  (PEPCID ) 20 MG tablet    Sig: Take 1 tablet (20 mg total) by mouth daily. For heartburn    Dispense:  30 tablet    Refill:  5   benzonatate  (TESSALON  PERLES) 100 MG capsule    Sig: Take 1 capsule (100 mg total) by mouth 3 (three) times daily as needed for cough.    Dispense:  30 capsule    Refill:  0   Lab Orders  CBC with Differential/Platelet         IgE      Diagnostics: Spirometry:  Tracings reviewed. Her effort: Good reproducible efforts. FVC: 1.75L FEV1: 1.45L, 81% predicted FEV1/FVC ratio: 83% Interpretation: Spirometry consistent with normal pattern.  Please see scanned spirometry results for details.  Results discussed with patient/family.   Medication List:  Current Outpatient Medications  Medication Sig Dispense Refill   acetaminophen  (TYLENOL ) 500 MG tablet Take 1,000 mg by mouth every 6 (six) hours as needed for moderate pain or headache.     albuterol  (PROVENTIL ) (2.5 MG/3ML) 0.083% nebulizer solution Take 3 mLs (2.5 mg total) by nebulization every 4 (four) hours as needed for wheezing or shortness of breath (coughing fits). 75 mL 1   albuterol  (VENTOLIN  HFA) 108 (90 Base) MCG/ACT inhaler Inhale 2 puffs into the lungs every 4  (four) hours as needed for wheezing or shortness of breath (coughing fits). 18 g 1   alum & mag hydroxide-simeth (MAALOX/MYLANTA) 200-200-20 MG/5ML suspension Take 15 mLs by mouth every 4 (four) hours as needed for indigestion or heartburn. 355 mL 0   aspirin  81 MG chewable tablet Chew 81 mg by mouth daily.     benzonatate  (TESSALON  PERLES) 100 MG capsule Take 1 capsule (100 mg total) by mouth 3 (three) times daily as needed for cough. 30 capsule 0   Blood Glucose Monitoring Suppl KIT 1 each by Does not apply route 4 (four) times daily. 1 kit 0   Blood Pressure Monitoring (BLOOD PRESSURE CUFF) MISC Use to check blood pressure twice a day. 1 each 1   busPIRone  (BUSPAR ) 10 MG tablet Take 1 tablet (10 mg total) by mouth daily. 90 tablet 1   CRESTOR  40 MG tablet TAKE ONE TABLET BY MOUTH EVERY DAY 90 tablet 1   cyclobenzaprine  (FLEXERIL ) 10 MG tablet TAKE ONE TABLET BY MOUTH THREE TIMES A DAY AS NEEDED 30 tablet 2   DULoxetine  (CYMBALTA ) 60 MG capsule Take 1 capsule (60 mg total) by mouth daily. 90 capsule 1   EPINEPHrine  0.3 mg/0.3 mL IJ SOAJ injection Inject 0.3 mg into the muscle as needed for anaphylaxis. 2 each 1   ezetimibe  (ZETIA ) 10 MG tablet TAKE ONE TABLET BY MOUTH EVERY DAY 90 tablet 2   famotidine  (PEPCID ) 20 MG tablet Take 1 tablet (20 mg total) by mouth daily. For heartburn 30 tablet 5   fluconazole  (DIFLUCAN ) 100 MG tablet Take 1 tablet (100 mg total) by mouth daily. Take 1 tablet by mouth now repeat in 5 days 2 tablet 0   gabapentin  (NEURONTIN ) 300 MG capsule Take 1 capsule (300 mg total) by mouth 2 (two) times daily. 180 capsule 1   lidocaine  (LIDODERM ) 5 % APPLY 1 PATCH TO AFFECTED AREA EVERY DAY AS NEEDED FOR BACK PAIN. REMOVE AND DISCARD PATCH WITHIN 12 HOURS OR AS DIRECTED BY MD. (12 HOURS ON-12 HOURS OFF) 30 patch 1   loperamide  (IMODIUM ) 2 MG capsule Take 1 capsule (2 mg total) by mouth every 6 (six) hours as needed for diarrhea or loose stools. 30 capsule 0   Melatonin 5 MG TABS  Take 5 mg by mouth at bedtime.     metFORMIN  (GLUCOPHAGE -XR) 750 MG 24 hr tablet Take 1 tablet by mouth daily 90 tablet 1   Multiple Vitamins-Minerals (ONE-A-DAY WOMENS PO) Take 1 tablet by mouth daily.     olmesartan  (BENICAR ) 5 MG tablet TAKE ONE TABLET BY MOUTH EVERY DAY 90 tablet 1   omalizumab  (XOLAIR ) 300 MG/2  ML  prefilled syringe Inject 375 mg into the skin every 14 (fourteen) days. 4 mL 11   omalizumab  (XOLAIR ) 75 MG/0.5ML prefilled syringe Inject 375 mg into the skin every 14 (fourteen) days. 1 mL 11   pantoprazole  (PROTONIX ) 40 MG tablet TAKE ONE TABLET BY MOUTH EVERY DAY 30 TO 60 MINUTES BEFORE FIRST MEAL OF THE DAY 90 tablet 1   Tetrahydrozoline HCl (VISINE OP) Place 1 drop into both eyes daily as needed (dry/itchy eyes).     Vitamin D , Ergocalciferol , (DRISDOL ) 1.25 MG (50000 UNIT) CAPS capsule TAKE ONE CAPSULE BY MOUTH EVERY SEVEN DAYS (MAY CONTAIN SOY OR PEANUT--TALK TO YOUR DOCTOR BEFORE TAKING IF YOU ARE ALLERGIC) 12 capsule 1   budesonide  (PULMICORT ) 0.5 MG/2ML nebulizer solution Take 2 mLs (0.5 mg total) by nebulization 2 (two) times daily as needed. 120 mL 3   budesonide -glycopyrrolate-formoterol  (BREZTRI  AEROSPHERE) 160-9-4.8 MCG/ACT AERO inhaler Inhale 2 puffs into the lungs in the morning and at bedtime. with spacer and rinse mouth afterwards. 10.7 g 5   montelukast  (SINGULAIR ) 10 MG tablet Take 1 tablet (10 mg total) by mouth at bedtime. 90 tablet 3   sitaGLIPtin  (JANUVIA ) 100 MG tablet Take 1 tablet (100 mg total) by mouth daily. 90 tablet 1   No current facility-administered medications for this visit.   Allergies: Allergies  Allergen Reactions   Tylenol  [Acetaminophen ] Itching    With codiene   Pollen Extract    Shellfish Allergy Hives   Peanut-Containing Drug Products Hives   I reviewed her past medical history, social history, family history, and environmental history and no significant changes have been reported from her previous visit.  Review of Systems   Constitutional:  Negative for appetite change, chills, fever and unexpected weight change.  HENT:  Negative for congestion, nosebleeds, rhinorrhea and sneezing.   Eyes:  Negative for itching.  Respiratory:  Positive for cough and shortness of breath. Negative for chest tightness and wheezing.   Cardiovascular:  Negative for chest pain.  Gastrointestinal:  Negative for abdominal pain.  Genitourinary:  Negative for difficulty urinating.  Skin:  Negative for rash.  Allergic/Immunologic: Positive for environmental allergies and food allergies.    Objective: BP 110/70   Pulse 91   Temp 97.7 F (36.5 C)   SpO2 97%  There is no height or weight on file to calculate BMI. Physical Exam Vitals and nursing note reviewed.  Constitutional:      Appearance: Normal appearance. She is well-developed.  HENT:     Head: Normocephalic and atraumatic.     Right Ear: Tympanic membrane and external ear normal.     Left Ear: Tympanic membrane and external ear normal.     Nose: Nose normal.     Mouth/Throat:     Mouth: Mucous membranes are dry.     Pharynx: Oropharynx is clear.  Eyes:     Conjunctiva/sclera: Conjunctivae normal.  Cardiovascular:     Rate and Rhythm: Normal rate and regular rhythm.     Heart sounds: Normal heart sounds. No murmur heard.    No friction rub. No gallop.  Pulmonary:     Effort: Pulmonary effort is normal.     Breath sounds: Normal breath sounds. No wheezing, rhonchi or rales.  Abdominal:     Palpations: Abdomen is soft.  Musculoskeletal:     Cervical back: Neck supple.  Skin:    General: Skin is warm.     Findings: No rash.  Neurological:     Mental Status: She is alert  and oriented to person, place, and time.  Psychiatric:        Behavior: Behavior normal.    Previous notes and tests were reviewed. The plan was reviewed with the patient/family, and all questions/concerned were addressed.  It was my pleasure to see Pierrette today and participate in her care.  Please feel free to contact me with any questions or concerns.  Sincerely,  Orlan Cramp, DO Allergy & Immunology  [facility]  [place] office: [phone] [facility] office: [phone]

## 2023-10-07 ENCOUNTER — Encounter: Payer: Self-pay | Admitting: Allergy

## 2023-10-07 ENCOUNTER — Ambulatory Visit: Admitting: Allergy

## 2023-10-07 ENCOUNTER — Other Ambulatory Visit: Payer: Self-pay

## 2023-10-07 VITALS — BP 110/70 | HR 91 | Temp 97.7°F

## 2023-10-07 DIAGNOSIS — T781XXD Other adverse food reactions, not elsewhere classified, subsequent encounter: Secondary | ICD-10-CM

## 2023-10-07 DIAGNOSIS — H1013 Acute atopic conjunctivitis, bilateral: Secondary | ICD-10-CM

## 2023-10-07 DIAGNOSIS — J4489 Other specified chronic obstructive pulmonary disease: Secondary | ICD-10-CM | POA: Diagnosis not present

## 2023-10-07 DIAGNOSIS — J301 Allergic rhinitis due to pollen: Secondary | ICD-10-CM | POA: Diagnosis not present

## 2023-10-07 DIAGNOSIS — J3089 Other allergic rhinitis: Secondary | ICD-10-CM

## 2023-10-07 DIAGNOSIS — K219 Gastro-esophageal reflux disease without esophagitis: Secondary | ICD-10-CM

## 2023-10-07 MED ORDER — BUDESONIDE 0.5 MG/2ML IN SUSP: 0.5000 mg | Freq: Two times a day (BID) | RESPIRATORY_TRACT | 3 refills | Status: AC | PRN

## 2023-10-07 MED ORDER — ALBUTEROL SULFATE (2.5 MG/3ML) 0.083% IN NEBU: 2.5000 mg | INHALATION_SOLUTION | RESPIRATORY_TRACT | 1 refills | Status: AC | PRN

## 2023-10-07 MED ORDER — BREZTRI AEROSPHERE 160-9-4.8 MCG/ACT IN AERO: 2.0000 | INHALATION_SPRAY | Freq: Two times a day (BID) | RESPIRATORY_TRACT | 5 refills | Status: AC

## 2023-10-07 MED ORDER — ALBUTEROL SULFATE HFA 108 (90 BASE) MCG/ACT IN AERS: 2.0000 | INHALATION_SPRAY | RESPIRATORY_TRACT | 1 refills | Status: DC | PRN

## 2023-10-07 MED ORDER — FAMOTIDINE 20 MG PO TABS: 20.0000 mg | ORAL_TABLET | Freq: Every day | ORAL | 5 refills | Status: AC

## 2023-10-07 MED ORDER — MONTELUKAST SODIUM 10 MG PO TABS: 10.0000 mg | ORAL_TABLET | Freq: Every day | ORAL | 3 refills | Status: AC

## 2023-10-07 MED ORDER — EPINEPHRINE 0.3 MG/0.3ML IJ SOAJ: 0.3000 mg | INTRAMUSCULAR | 1 refills | Status: AC | PRN

## 2023-10-07 MED ORDER — BENZONATATE 100 MG PO CAPS: 100.0000 mg | ORAL_CAPSULE | Freq: Three times a day (TID) | ORAL | 0 refills | Status: AC | PRN

## 2023-10-07 NOTE — Patient Instructions (Addendum)
 Breathing:  Try to decrease smoking - smoke 1 less cigarette per week. Daily controller medication(s): continue Breztri  2 puffs twice a day with spacer and rinse mouth afterwards. During respiratory infections/flares:  Start budesonide  (Pulmicort ) 0.5mg  nebulizer for 1-2 weeks until your breathing symptoms return to baseline.  Pretreat with albuterol  2 puffs or albuterol  nebulizer.  If you need to use your albuterol  nebulizer machine back to back within 15-30 minutes with no relief then please go to the ER/urgent care for further evaluation.  May use albuterol  rescue inhaler 2 puffs or nebulizer every 4 to 6 hours as needed for shortness of breath, chest tightness, coughing, and wheezing. May use albuterol  rescue inhaler 2 puffs 5 to 15 minutes prior to strenuous physical activities. Monitor frequency of use - if you need to use it more than twice per week on a consistent basis let us  know.  Breathing control goals:  Full participation in all desired activities (may need albuterol  before activity) Albuterol  use two times or less a week on average (not counting use with activity) Cough interfering with sleep two times or less a month Oral steroids no more than once a year No hospitalizations  Get bloodwork to see what injection you qualify for.  We are ordering labs, so please allow 1-2 weeks for the results to come back. With the newly implemented Cures Act, the labs might be visible to you at the same time that they become visible to me. However, I will not address the results until all of the results are back, so please be patient.  In the meantime, continue recommendations in your patient instructions, including avoidance measures (if applicable), until you hear from me.  Environmental allergies 2022 skin testing positive to grass, ragweed, dust mites and feathers.  Continue environmental control measures. Continue Singulair  (montelukast ) 10mg  daily at night. Use over the counter  antihistamines such as Zyrtec (cetirizine), Claritin (loratadine), Allegra (fexofenadine), or Xyzal (levocetirizine) daily as needed. May take twice a day during allergy flares. May switch antihistamines every few months.  Food: Continue to avoid shellfish, peanuts, sesame seeds, poppy seeds. For mild symptoms you can take over the counter antihistamines (zyrtec 10mg  to 20mg ) and monitor symptoms closely.  If symptoms worsen or if you have severe symptoms including breathing issues, throat closure, significant swelling, whole body hives, severe diarrhea and vomiting, lightheadedness then use epinephrine  and seek immediate medical care afterwards. Emergency action plan in place.   Heartburn: Continue lifestyle and dietary modifications. Start famotidine  20mg  daily at night. Continue Protonix  40mg  daily in the morning. Nothing to eat/drink for 30 minutes afterwards.   Follow up in 3 months or sooner if needed.  Get flu shot in the fall.

## 2023-10-11 LAB — CBC WITH DIFFERENTIAL/PLATELET
Basophils Absolute: 0 x10E3/uL (ref 0.0–0.2)
Basos: 1 %
EOS (ABSOLUTE): 0.2 x10E3/uL (ref 0.0–0.4)
Eos: 3 %
Hematocrit: 37.9 % (ref 34.0–46.6)
Hemoglobin: 11.7 g/dL (ref 11.1–15.9)
Immature Grans (Abs): 0 x10E3/uL (ref 0.0–0.1)
Immature Granulocytes: 0 %
Lymphocytes Absolute: 1.3 x10E3/uL (ref 0.7–3.1)
Lymphs: 21 %
MCH: 28.3 pg (ref 26.6–33.0)
MCHC: 30.9 g/dL — ABNORMAL LOW (ref 31.5–35.7)
MCV: 92 fL (ref 79–97)
Monocytes Absolute: 0.4 x10E3/uL (ref 0.1–0.9)
Monocytes: 7 %
Neutrophils Absolute: 4.2 x10E3/uL (ref 1.4–7.0)
Neutrophils: 68 %
Platelets: 190 x10E3/uL (ref 150–450)
RBC: 4.13 x10E6/uL (ref 3.77–5.28)
RDW: 13.1 % (ref 11.7–15.4)
WBC: 6.1 x10E3/uL (ref 3.4–10.8)

## 2023-10-11 LAB — IGE: IgE (Immunoglobulin E), Serum: 935 [IU]/mL — ABNORMAL HIGH (ref 6–495)

## 2023-10-12 ENCOUNTER — Ambulatory Visit: Payer: Self-pay | Admitting: Allergy

## 2023-10-12 ENCOUNTER — Telehealth: Payer: Self-pay | Admitting: Allergy

## 2023-10-12 NOTE — Telephone Encounter (Signed)
 Please start PA for Nucala every 4 weeks for copd and asthma. Eos 200. Thank you.

## 2023-10-12 NOTE — Progress Notes (Signed)
 Please call patient.   Your bloodwork would qualify you for Nucala which is an injection that is approved for asthma and copd. I'm going to have Tammy start the paperwork to see approval.  It's 1 injection every 4 weeks.

## 2023-10-13 NOTE — Telephone Encounter (Signed)
 L/m for patient to contact me to advise approval for Fasenra. Patient was denied for Nucala due to Ins step therapy Fasenra or Dupixent requirement

## 2023-10-13 NOTE — Addendum Note (Signed)
 Addended by: OTHA MADELIN HERO on: 10/13/2023 03:54 PM   Modules accepted: Orders

## 2023-10-20 ENCOUNTER — Telehealth: Payer: Self-pay | Admitting: Allergy

## 2023-10-20 NOTE — Telephone Encounter (Signed)
 [name] called and stated that she received approval through insurance for [place], and would like to go ahead and schedule an injection. She states that she has not been in touch with Tammy yet.

## 2023-10-22 NOTE — Telephone Encounter (Addendum)
Tried to reach patient again - unable to leave message

## 2023-10-22 NOTE — Telephone Encounter (Signed)
 Already l/m last week for patient to contact me

## 2023-11-03 NOTE — Telephone Encounter (Signed)
 Patient called and l/m for me to call her but when I reached out unable to leave message

## 2023-11-10 MED ORDER — FASENRA 30 MG/ML ~~LOC~~ SOSY
30.0000 mg | PREFILLED_SYRINGE | SUBCUTANEOUS | 6 refills | Status: AC
  Filled 2023-11-13: qty 1, 56d supply, fill #0
  Filled 2024-03-18: qty 1, 34d supply, fill #0

## 2023-11-10 MED ORDER — FASENRA 30 MG/ML ~~LOC~~ SOSY
30.0000 mg | PREFILLED_SYRINGE | SUBCUTANEOUS | 2 refills | Status: AC
  Filled 2023-11-13: qty 1, 28d supply, fill #0
  Filled 2023-12-07 – 2024-02-23 (×4): qty 1, 28d supply, fill #1
  Filled 2024-03-22: qty 1, 28d supply, fill #2

## 2023-11-10 NOTE — Telephone Encounter (Signed)
 Patient called and I advised approval for Fasenra  and rx to [facility]. I did advise patient to fix her voicemail so that the pharmacy can get in touch with her and I can call her back to schedule her to start therapy once delivery set

## 2023-11-10 NOTE — Addendum Note (Signed)
 Addended by: OTHA MADELIN HERO on: 11/10/2023 03:22 PM   Modules accepted: Orders

## 2023-11-11 ENCOUNTER — Other Ambulatory Visit: Payer: Self-pay

## 2023-11-11 ENCOUNTER — Other Ambulatory Visit (HOSPITAL_COMMUNITY): Payer: Self-pay

## 2023-11-13 ENCOUNTER — Other Ambulatory Visit: Payer: Self-pay

## 2023-11-13 ENCOUNTER — Other Ambulatory Visit (HOSPITAL_COMMUNITY): Payer: Self-pay

## 2023-11-13 NOTE — Progress Notes (Signed)
 Specialty Pharmacy Initiation Note   [name] is a 63 y.o. female who will be followed by the specialty pharmacy service for RxSp Asthma/COPD    Review of administration, indication, effectiveness, safety, potential side effects, storage/disposable, and missed dose instructions occurred today for patient's specialty medication(s) Benralizumab  (Fasenra )     Patient/Caregiver did not have any additional questions or concerns.   Patient's therapy is appropriate to: Initiate    Goals Addressed             This Visit's Progress    Reduce disease symptoms including coughing and shortness of breath       Patient is initiating therapy. Patient will maintain adherence         Delon CHRISTELLA Brow Specialty Pharmacist

## 2023-11-13 NOTE — Progress Notes (Signed)
 Specialty Pharmacy Initial Fill Coordination Note  [name] is a 63 y.o. female contacted today regarding initial fill of specialty medication(s) Benralizumab  (Fasenra )   Patient requested Courier to Provider Office   Delivery date: 11/17/23   Verified address: [address] [place] [address]   Medication will be filled on 09/22.   Patient is aware of $4.00 copayment.   *patient lost CC card-aware that we will send bill for payment.

## 2023-11-16 ENCOUNTER — Other Ambulatory Visit: Payer: Self-pay

## 2023-11-24 ENCOUNTER — Ambulatory Visit

## 2023-11-24 ENCOUNTER — Other Ambulatory Visit (HOSPITAL_COMMUNITY): Payer: Self-pay

## 2023-11-30 ENCOUNTER — Ambulatory Visit

## 2023-12-05 ENCOUNTER — Emergency Department (HOSPITAL_COMMUNITY)

## 2023-12-05 ENCOUNTER — Emergency Department (HOSPITAL_COMMUNITY)
Admission: EM | Admit: 2023-12-05 | Discharge: 2023-12-05 | Disposition: A | Discharge status: Home / Self Care | Attending: Emergency Medicine | Admitting: Emergency Medicine

## 2023-12-05 DIAGNOSIS — E1122 Type 2 diabetes mellitus with diabetic chronic kidney disease: Secondary | ICD-10-CM | POA: Diagnosis not present

## 2023-12-05 DIAGNOSIS — Z7984 Long term (current) use of oral hypoglycemic drugs: Secondary | ICD-10-CM | POA: Diagnosis not present

## 2023-12-05 DIAGNOSIS — I129 Hypertensive chronic kidney disease with stage 1 through stage 4 chronic kidney disease, or unspecified chronic kidney disease: Secondary | ICD-10-CM | POA: Insufficient documentation

## 2023-12-05 DIAGNOSIS — Z7982 Long term (current) use of aspirin: Secondary | ICD-10-CM | POA: Insufficient documentation

## 2023-12-05 DIAGNOSIS — R531 Weakness: Secondary | ICD-10-CM | POA: Diagnosis not present

## 2023-12-05 DIAGNOSIS — Z91010 Allergy to peanuts: Secondary | ICD-10-CM | POA: Insufficient documentation

## 2023-12-05 DIAGNOSIS — J449 Chronic obstructive pulmonary disease, unspecified: Secondary | ICD-10-CM | POA: Insufficient documentation

## 2023-12-05 DIAGNOSIS — E860 Dehydration: Secondary | ICD-10-CM | POA: Diagnosis not present

## 2023-12-05 DIAGNOSIS — N189 Chronic kidney disease, unspecified: Secondary | ICD-10-CM | POA: Insufficient documentation

## 2023-12-05 DIAGNOSIS — R112 Nausea with vomiting, unspecified: Secondary | ICD-10-CM | POA: Diagnosis present

## 2023-12-05 LAB — CBC
HCT: 46.5 % — ABNORMAL HIGH (ref 36.0–46.0)
Hemoglobin: 14.5 g/dL (ref 12.0–15.0)
MCH: 28.2 pg (ref 26.0–34.0)
MCHC: 31.2 g/dL (ref 30.0–36.0)
MCV: 90.5 fL (ref 80.0–100.0)
Platelets: 254 K/uL (ref 150–400)
RBC: 5.14 MIL/uL — ABNORMAL HIGH (ref 3.87–5.11)
RDW: 13.5 % (ref 11.5–15.5)
WBC: 5.6 K/uL (ref 4.0–10.5)
nRBC: 0 % (ref 0.0–0.2)

## 2023-12-05 LAB — COMPREHENSIVE METABOLIC PANEL WITH GFR
ALT: 100 U/L — ABNORMAL HIGH (ref 0–44)
AST: 117 U/L — ABNORMAL HIGH (ref 15–41)
Albumin: 4.8 g/dL (ref 3.5–5.0)
Alkaline Phosphatase: 88 U/L (ref 38–126)
Anion gap: 16 — ABNORMAL HIGH (ref 5–15)
BUN: 14 mg/dL (ref 8–23)
CO2: 23 mmol/L (ref 22–32)
Calcium: 9.6 mg/dL (ref 8.9–10.3)
Chloride: 96 mmol/L — ABNORMAL LOW (ref 98–111)
Creatinine, Ser: 1.4 mg/dL — ABNORMAL HIGH (ref 0.44–1.00)
GFR, Estimated: 42 mL/min — ABNORMAL LOW (ref >60)
Glucose, Bld: 273 mg/dL — ABNORMAL HIGH (ref 70–99)
Potassium: 3.9 mmol/L (ref 3.5–5.1)
Sodium: 136 mmol/L (ref 135–145)
Total Bilirubin: 0.5 mg/dL (ref 0.0–1.2)
Total Protein: 8.6 g/dL — ABNORMAL HIGH (ref 6.5–8.1)

## 2023-12-05 LAB — URINALYSIS, ROUTINE W REFLEX MICROSCOPIC
Bilirubin Urine: NEGATIVE
Glucose, UA: >=500 mg/dL — AB
Hgb urine dipstick: NEGATIVE
Ketones, ur: NEGATIVE mg/dL
Leukocytes,Ua: NEGATIVE
Nitrite: NEGATIVE
Protein, ur: 100 mg/dL — AB
Specific Gravity, Urine: 1.013 (ref 1.005–1.030)
pH: 5 (ref 5.0–8.0)

## 2023-12-05 LAB — LIPASE, BLOOD: Lipase: 118 U/L — ABNORMAL HIGH (ref 11–51)

## 2023-12-05 LAB — TROPONIN T, HIGH SENSITIVITY
Troponin T High Sensitivity: 16 ng/L (ref 0–19)
Troponin T High Sensitivity: <15 ng/L (ref 0–19)

## 2023-12-05 LAB — CBG MONITORING, ED
Glucose-Capillary: 261 mg/dL — ABNORMAL HIGH (ref 70–99)
Glucose-Capillary: 222 mg/dL — ABNORMAL HIGH (ref 70–99)

## 2023-12-05 LAB — PRO BRAIN NATRIURETIC PEPTIDE: Pro Brain Natriuretic Peptide: 55.3 pg/mL (ref <300.0)

## 2023-12-05 LAB — MAGNESIUM: Magnesium: 1.6 mg/dL — ABNORMAL LOW (ref 1.7–2.4)

## 2023-12-05 MED ORDER — LACTATED RINGERS IV BOLUS
500.0000 mL | Freq: Once | INTRAVENOUS | Status: AC
  Administered 2023-12-05: 500 mL via INTRAVENOUS

## 2023-12-05 MED ORDER — METHOCARBAMOL 500 MG PO TABS: 500.0000 mg | ORAL_TABLET | Freq: Three times a day (TID) | ORAL | 0 refills | Status: AC | PRN

## 2023-12-05 MED ORDER — LACTATED RINGERS IV BOLUS
1000.0000 mL | Freq: Once | INTRAVENOUS | Status: AC
  Administered 2023-12-05: 1000 mL via INTRAVENOUS

## 2023-12-05 MED ORDER — ONDANSETRON HCL 4 MG/2ML IJ SOLN
4.0000 mg | Freq: Once | INTRAMUSCULAR | Status: AC
Start: 2023-12-05 — End: 2023-12-05
  Administered 2023-12-05: 4 mg via INTRAVENOUS
  Filled 2023-12-05: qty 2

## 2023-12-05 MED ORDER — INSULIN ASPART 100 UNIT/ML IJ SOLN
4.0000 [IU] | Freq: Once | INTRAMUSCULAR | Status: AC
  Administered 2023-12-05: 4 [IU] via SUBCUTANEOUS
  Filled 2023-12-05: qty 0.04

## 2023-12-05 MED ORDER — METHOCARBAMOL 500 MG PO TABS
1000.0000 mg | ORAL_TABLET | Freq: Once | ORAL | Status: AC
Start: 2023-12-05 — End: 2023-12-05
  Administered 2023-12-05: 1000 mg via ORAL
  Filled 2023-12-05: qty 2

## 2023-12-05 MED ORDER — IOHEXOL 350 MG/ML SOLN
75.0000 mL | Freq: Once | INTRAVENOUS | Status: AC | PRN
  Administered 2023-12-05: 75 mL via INTRAVENOUS

## 2023-12-05 MED ORDER — MAGNESIUM SULFATE 2 GM/50ML IV SOLN
2.0000 g | Freq: Once | INTRAVENOUS | Status: AC
  Administered 2023-12-05: 2 g via INTRAVENOUS
  Filled 2023-12-05: qty 50

## 2023-12-05 MED ORDER — ONDANSETRON 4 MG PO TBDP: 4.0000 mg | ORAL_TABLET | Freq: Three times a day (TID) | ORAL | 0 refills | Status: AC | PRN

## 2023-12-05 NOTE — ED Triage Notes (Signed)
 BIB EMS from home C/o weakness, dizziness, nausea, decreased appetite/oral intake and shobr x 2 days  22 G IV Right hand 300 ml NS en route 4 mg zofran   Denies sick contacts

## 2023-12-05 NOTE — ED Notes (Signed)
 Pt unable to give urine at this time.

## 2023-12-05 NOTE — ED Provider Notes (Addendum)
 [place] EMERGENCY DEPARTMENT AT [facility] Provider Note   CSN: [id number] Arrival date & time: 12/05/23  1324     Patient presents with: Weakness   [name] is a 63 y.o. female.   HPI Patient presents for generalized weakness.  Medical history includes COPD, DM, fibromyalgia, HTN, HLD, CKD, GERD, anxiety, depression.  3 days ago, she was in her normal state of health.  While at her daughter's house, she had an episode of uncontrolled shaking.  Since that time, she has had a generalized weakness, nausea without vomiting, decreased p.o. intake.  She attributes her poor p.o. intake to loss of appetite and metallic taste in her mouth.  She has required assistance with ambulation due to the dizziness and weakness.  She stayed in the bed throughout the day yesterday due to fatigue.  Today, her symptoms seem to have worsened.  She has not had any recent medication changes.  She does take a losartan for blood pressure control.  Her sugars have been in the range of 200s.  She denies any recent blood loss.  She has had a small area of pain in region of L1 but denies any other recent areas of discomfort.  She has exertional shortness of breath which is normal for her.    Prior to Admission medications   Medication Sig Start Date End Date Taking? Authorizing Provider  acetaminophen  (TYLENOL ) 500 MG tablet Take 1,000 mg by mouth every 6 (six) hours as needed for moderate pain or headache.    [provider]  albuterol  (PROVENTIL ) (2.5 MG/3ML) 0.083% nebulizer solution Take 3 mLs (2.5 mg total) by nebulization every 4 (four) hours as needed for wheezing or shortness of breath (coughing fits). 10/07/23   Luke Orlan HERO, DO  albuterol  (VENTOLIN  HFA) 108 (90 Base) MCG/ACT inhaler Inhale 2 puffs into the lungs every 4 (four) hours as needed for wheezing or shortness of breath (coughing fits). 10/07/23   Luke Orlan HERO, DO  alum & mag hydroxide-simeth (MAALOX/MYLANTA) 200-200-20 MG/5ML  suspension Take 15 mLs by mouth every 4 (four) hours as needed for indigestion or heartburn. 07/23/17   Nikki Hansel Atlas, MD  aspirin  81 MG chewable tablet Chew 81 mg by mouth daily.    [provider]  benralizumab  (FASENRA ) 30 MG/ML prefilled syringe Inject 1 mL (30 mg total) into the skin every 8 (eight) weeks. 11/10/23   Luke Orlan HERO, DO  benralizumab  (FASENRA ) 30 MG/ML prefilled syringe Inject 1 mL (30 mg total) into the skin every 28 (twenty-eight) days. 11/10/23   Luke Orlan HERO, DO  benzonatate  (TESSALON  PERLES) 100 MG capsule Take 1 capsule (100 mg total) by mouth 3 (three) times daily as needed for cough. 10/07/23   Luke Orlan HERO, DO  Blood Glucose Monitoring Suppl KIT 1 each by Does not apply route 4 (four) times daily. 06/04/22   Georgina Speaks, [name]  Blood Pressure Monitoring (BLOOD PRESSURE CUFF) MISC Use to check blood pressure twice a day. 09/09/21   Georgina Speaks, [name]  budesonide  (PULMICORT ) 0.5 MG/2ML nebulizer solution Take 2 mLs (0.5 mg total) by nebulization 2 (two) times daily as needed. 10/07/23   Luke Orlan HERO, DO  budesonide -glycopyrrolate-formoterol  (BREZTRI  AEROSPHERE) 160-9-4.8 MCG/ACT AERO inhaler Inhale 2 puffs into the lungs in the morning and at bedtime. with spacer and rinse mouth afterwards. 10/07/23   Luke Orlan HERO, DO  busPIRone  (BUSPAR ) 10 MG tablet Take 1 tablet (10 mg total) by mouth daily. 06/18/23   Georgina Speaks,  [name]  CRESTOR  40 MG tablet TAKE ONE TABLET BY MOUTH EVERY DAY 09/21/23   Moore, Janece, [name]  cyclobenzaprine  (FLEXERIL ) 10 MG tablet TAKE ONE TABLET BY MOUTH THREE TIMES A DAY AS NEEDED 09/21/23   Georgina Speaks, [name]  DULoxetine  (CYMBALTA ) 60 MG capsule Take 1 capsule (60 mg total) by mouth daily. 01/07/23   Georgina Speaks, [name]  EPINEPHrine  0.3 mg/0.3 mL IJ SOAJ injection Inject 0.3 mg into the muscle as needed for anaphylaxis. 10/07/23   Luke Orlan HERO, DO  ezetimibe  (ZETIA ) 10 MG tablet TAKE ONE TABLET BY MOUTH EVERY DAY 08/06/23   Moore, Janece, [name]  famotidine   (PEPCID ) 20 MG tablet Take 1 tablet (20 mg total) by mouth daily. For heartburn 10/07/23   Luke Orlan HERO, DO  fluconazole  (DIFLUCAN ) 100 MG tablet Take 1 tablet (100 mg total) by mouth daily. Take 1 tablet by mouth now repeat in 5 days 05/28/22   Georgina Speaks, [name]  gabapentin  (NEURONTIN ) 300 MG capsule Take 1 capsule (300 mg total) by mouth 2 (two) times daily. 01/07/23   Georgina Speaks, [name]  lidocaine  (LIDODERM ) 5 % APPLY 1 PATCH TO AFFECTED AREA EVERY DAY AS NEEDED FOR BACK PAIN. REMOVE AND DISCARD PATCH WITHIN 12 HOURS OR AS DIRECTED BY MD. (12 HOURS ON-12 HOURS OFF) 05/15/23   Georgina Speaks, [name]  loperamide  (IMODIUM ) 2 MG capsule Take 1 capsule (2 mg total) by mouth every 6 (six) hours as needed for diarrhea or loose stools. 05/03/21   Dahal, Binaya, MD  Melatonin 5 MG TABS Take 5 mg by mouth at bedtime.    [provider]  metFORMIN  (GLUCOPHAGE -XR) 750 MG 24 hr tablet Take 1 tablet by mouth daily 01/07/23   Georgina Speaks, [name]  montelukast  (SINGULAIR ) 10 MG tablet Take 1 tablet (10 mg total) by mouth at bedtime. 10/07/23   Luke Orlan HERO, DO  Multiple Vitamins-Minerals (ONE-A-DAY WOMENS PO) Take 1 tablet by mouth daily.    [provider]  olmesartan  (BENICAR ) 5 MG tablet TAKE ONE TABLET BY MOUTH EVERY DAY 08/06/23   Moore, Janece, [name]  pantoprazole  (PROTONIX ) 40 MG tablet TAKE ONE TABLET BY MOUTH EVERY DAY 30 TO 60 MINUTES BEFORE FIRST MEAL OF THE DAY 01/07/23   Georgina Speaks, [name]  sitaGLIPtin  (JANUVIA ) 100 MG tablet Take 1 tablet (100 mg total) by mouth daily. 01/07/23   Georgina Speaks, [name]  Tetrahydrozoline HCl (VISINE OP) Place 1 drop into both eyes daily as needed (dry/itchy eyes).    [provider]  Vitamin D , Ergocalciferol , (DRISDOL ) 1.25 MG (50000 UNIT) CAPS capsule TAKE ONE CAPSULE BY MOUTH EVERY SEVEN DAYS (MAY CONTAIN SOY OR PEANUT--TALK TO YOUR DOCTOR BEFORE TAKING IF YOU ARE ALLERGIC) 04/29/23   Georgina Speaks, [name]    Allergies: Tylenol  [acetaminophen ], Pollen extract,  Shellfish allergy, and Peanut-containing drug products    Review of Systems  Constitutional:  Positive for activity change, appetite change and fatigue.  Gastrointestinal:  Positive for nausea.  Neurological:  Positive for dizziness, tremors, weakness (Generalized) and light-headedness.  All other systems reviewed and are negative.   Updated Vital Signs BP 100/71   Pulse 96   Temp 98.6 F (37 C)   Resp 19   Ht 5' (1.524 m)   Wt 59 kg   SpO2 96%   BMI 25.39 kg/m   Physical Exam Vitals and nursing note reviewed.  Constitutional:      General: She is not in acute distress.    Appearance: Normal appearance. She is well-developed. She  is not ill-appearing, toxic-appearing or diaphoretic.  HENT:     Head: Normocephalic and atraumatic.     Right Ear: External ear normal.     Left Ear: External ear normal.     Nose: Nose normal.     Mouth/Throat:     Mouth: Mucous membranes are moist.  Eyes:     Extraocular Movements: Extraocular movements intact.     Conjunctiva/sclera: Conjunctivae normal.  Cardiovascular:     Rate and Rhythm: Normal rate and regular rhythm.  Pulmonary:     Effort: Pulmonary effort is normal. No respiratory distress.     Breath sounds: Normal breath sounds. No wheezing or rales.  Abdominal:     General: There is no distension.     Palpations: Abdomen is soft.     Tenderness: There is no abdominal tenderness.  Musculoskeletal:        General: No swelling or deformity. Normal range of motion.     Cervical back: Normal range of motion and neck supple.     Right lower leg: No edema.     Left lower leg: No edema.  Skin:    General: Skin is warm and dry.     Coloration: Skin is not jaundiced or pale.  Neurological:     General: No focal deficit present.     Mental Status: She is alert and oriented to person, place, and time.     Cranial Nerves: No cranial nerve deficit.     Sensory: No sensory deficit.     Motor: No weakness.     Coordination:  Coordination normal.  Psychiatric:        Mood and Affect: Mood normal.        Behavior: Behavior normal.     (all labs ordered are listed, but only abnormal results are displayed) Labs Reviewed  LIPASE, BLOOD - Abnormal; Notable for the following components:      Result Value   Lipase 118 (*)    All other components within normal limits  COMPREHENSIVE METABOLIC PANEL WITH GFR - Abnormal; Notable for the following components:   Chloride 96 (*)    Glucose, Bld 273 (*)    Creatinine, Ser 1.40 (*)    Total Protein 8.6 (*)    AST 117 (*)    ALT 100 (*)    GFR, Estimated 42 (*)    Anion gap 16 (*)    All other components within normal limits  CBC - Abnormal; Notable for the following components:   RBC 5.14 (*)    HCT 46.5 (*)    All other components within normal limits  URINALYSIS, ROUTINE W REFLEX MICROSCOPIC - Abnormal; Notable for the following components:   APPearance HAZY (*)    Glucose, UA >=500 (*)    Protein, ur 100 (*)    Bacteria, UA RARE (*)    All other components within normal limits  MAGNESIUM  - Abnormal; Notable for the following components:   Magnesium  1.6 (*)    All other components within normal limits  CBG MONITORING, ED - Abnormal; Notable for the following components:   Glucose-Capillary 261 (*)    All other components within normal limits  PRO BRAIN NATRIURETIC PEPTIDE  TROPONIN T, HIGH SENSITIVITY  TROPONIN T, HIGH SENSITIVITY    EKG: None  Radiology: CT Angio Chest PE W and/or Wo Contrast Result Date: 12/05/2023 CLINICAL DATA:  Pulmonary embolism (PE) suspected, high prob; Abdominal pain, acute, nonlocalized Syncope/presyncope, cerebrovascular cause suspected/ R/O PE/ ABD pain EXAM:  CT ANGIOGRAPHY CHEST CT ABDOMEN AND PELVIS WITH CONTRAST TECHNIQUE: Multidetector CT imaging of the chest was performed using the standard protocol during bolus administration of intravenous contrast. Multiplanar CT image reconstructions and MIPs were obtained to  evaluate the vascular anatomy. Multidetector CT imaging of the abdomen and pelvis was performed using the standard protocol during bolus administration of intravenous contrast. RADIATION DOSE REDUCTION: This exam was performed according to the departmental dose-optimization program which includes automated exposure control, adjustment of the mA and/or kV according to patient size and/or use of iterative reconstruction technique. CONTRAST:  75mL OMNIPAQUE IOHEXOL 350 MG/ML SOLN COMPARISON:  None Available. FINDINGS: CTA CHEST FINDINGS Cardiovascular: Fair opacification of the pulmonary arteries to the segmental level. No evidence of pulmonary embolism. Limited evaluation of the subsegmental level due to timing of contrast. Normal heart size. No significant pericardial effusion. The thoracic aorta is normal in caliber. No atherosclerotic plaque of the thoracic aorta. No coronary artery calcifications. Mediastinum/Nodes: No enlarged mediastinal, hilar, or axillary lymph nodes. Thyroid gland, trachea, and esophagus demonstrate no significant findings. Lungs/Pleura: Moderate to severe centrilobular emphysematous changes. No focal consolidation. No pulmonary nodule. No pulmonary mass. No pleural effusion. No pneumothorax. Musculoskeletal: No chest wall abnormality. No suspicious lytic or blastic osseous lesions. No acute displaced fracture. Multilevel degenerative changes of the spine. Review of the MIP images confirms the above findings. CT ABDOMEN and PELVIS FINDINGS Hepatobiliary: Diffusely hypodense hepatic parenchyma. No focal liver abnormality. No gallstones, gallbladder wall thickening, or pericholecystic fluid. No biliary dilatation. Pancreas: No focal lesion. Normal pancreatic contour. No surrounding inflammatory changes. No main pancreatic ductal dilatation. Spleen: Normal in size without focal abnormality. Adrenals/Urinary Tract: No adrenal nodule bilaterally. Nonspecific trace perinephric fat stranding.  Bilateral kidneys enhance symmetrically. No hydronephrosis. No hydroureter. No nephroureterolithiasis bilaterally. Calcifications associated with the left kidney appears to be vascular The urinary bladder is unremarkable. Stomach/Bowel: Stomach is within normal limits. No evidence of bowel wall thickening or dilatation. Colonic diverticulosis. Appendix appears normal. Vascular/Lymphatic: At least moderate atherosclerotic plaque of the aorta and its branches. No abdominal aorta or iliac aneurysm. Mild atherosclerotic plaque of the aorta and its branches. No abdominal, pelvic, or inguinal lymphadenopathy. Reproductive: Status post hysterectomy. No adnexal masses. Other: No intraperitoneal free fluid. No intraperitoneal free gas. No organized fluid collection. Musculoskeletal: No abdominal wall hernia or abnormality. No suspicious lytic or blastic osseous lesions. No acute displaced fracture. Grade 1 anterolisthesis of L4 on L5. Mild retrolisthesis of L5 on S1. Review of the MIP images confirms the above findings. IMPRESSION: 1. No pulmonary embolus with limited evaluation of the subsegmental level due to timing of contrast. 2. No acute traumatic injury. 3. No acute intrathoracic abnormality. 4. Colonic diverticulosis without acute diverticulitis. 5. Hepatic steatosis. 6. Aortic Atherosclerosis (ICD10-I70.0) and Emphysema (ICD10-J43.9). Electronically Signed   By: Morgane  Naveau M.D.   On: 12/05/2023 17:11   CT ABDOMEN PELVIS W CONTRAST Result Date: 12/05/2023 CLINICAL DATA:  Pulmonary embolism (PE) suspected, high prob; Abdominal pain, acute, nonlocalized Syncope/presyncope, cerebrovascular cause suspected/ R/O PE/ ABD pain EXAM: CT ANGIOGRAPHY CHEST CT ABDOMEN AND PELVIS WITH CONTRAST TECHNIQUE: Multidetector CT imaging of the chest was performed using the standard protocol during bolus administration of intravenous contrast. Multiplanar CT image reconstructions and MIPs were obtained to evaluate the vascular  anatomy. Multidetector CT imaging of the abdomen and pelvis was performed using the standard protocol during bolus administration of intravenous contrast. RADIATION DOSE REDUCTION: This exam was performed according to the departmental dose-optimization program which includes automated exposure  control, adjustment of the mA and/or kV according to patient size and/or use of iterative reconstruction technique. CONTRAST:  75mL OMNIPAQUE IOHEXOL 350 MG/ML SOLN COMPARISON:  None Available. FINDINGS: CTA CHEST FINDINGS Cardiovascular: Fair opacification of the pulmonary arteries to the segmental level. No evidence of pulmonary embolism. Limited evaluation of the subsegmental level due to timing of contrast. Normal heart size. No significant pericardial effusion. The thoracic aorta is normal in caliber. No atherosclerotic plaque of the thoracic aorta. No coronary artery calcifications. Mediastinum/Nodes: No enlarged mediastinal, hilar, or axillary lymph nodes. Thyroid gland, trachea, and esophagus demonstrate no significant findings. Lungs/Pleura: Moderate to severe centrilobular emphysematous changes. No focal consolidation. No pulmonary nodule. No pulmonary mass. No pleural effusion. No pneumothorax. Musculoskeletal: No chest wall abnormality. No suspicious lytic or blastic osseous lesions. No acute displaced fracture. Multilevel degenerative changes of the spine. Review of the MIP images confirms the above findings. CT ABDOMEN and PELVIS FINDINGS Hepatobiliary: Diffusely hypodense hepatic parenchyma. No focal liver abnormality. No gallstones, gallbladder wall thickening, or pericholecystic fluid. No biliary dilatation. Pancreas: No focal lesion. Normal pancreatic contour. No surrounding inflammatory changes. No main pancreatic ductal dilatation. Spleen: Normal in size without focal abnormality. Adrenals/Urinary Tract: No adrenal nodule bilaterally. Nonspecific trace perinephric fat stranding. Bilateral kidneys enhance  symmetrically. No hydronephrosis. No hydroureter. No nephroureterolithiasis bilaterally. Calcifications associated with the left kidney appears to be vascular The urinary bladder is unremarkable. Stomach/Bowel: Stomach is within normal limits. No evidence of bowel wall thickening or dilatation. Colonic diverticulosis. Appendix appears normal. Vascular/Lymphatic: At least moderate atherosclerotic plaque of the aorta and its branches. No abdominal aorta or iliac aneurysm. Mild atherosclerotic plaque of the aorta and its branches. No abdominal, pelvic, or inguinal lymphadenopathy. Reproductive: Status post hysterectomy. No adnexal masses. Other: No intraperitoneal free fluid. No intraperitoneal free gas. No organized fluid collection. Musculoskeletal: No abdominal wall hernia or abnormality. No suspicious lytic or blastic osseous lesions. No acute displaced fracture. Grade 1 anterolisthesis of L4 on L5. Mild retrolisthesis of L5 on S1. Review of the MIP images confirms the above findings. IMPRESSION: 1. No pulmonary embolus with limited evaluation of the subsegmental level due to timing of contrast. 2. No acute traumatic injury. 3. No acute intrathoracic abnormality. 4. Colonic diverticulosis without acute diverticulitis. 5. Hepatic steatosis. 6. Aortic Atherosclerosis (ICD10-I70.0) and Emphysema (ICD10-J43.9). Electronically Signed   By: Morgane  Naveau M.D.   On: 12/05/2023 17:11   CT Head Wo Contrast Result Date: 12/05/2023 EXAM: CT HEAD WITHOUT CONTRAST 12/05/2023 04:36:04 PM TECHNIQUE: CT of the head was performed without the administration of intravenous contrast. Automated exposure control, iterative reconstruction, and/or weight based adjustment of the mA/kV was utilized to reduce the radiation dose to as low as reasonably achievable. COMPARISON: CT head without contrast 03/06/2022. CLINICAL HISTORY: Syncope/presyncope, cerebrovascular cause suspected. R/O PE. ABD pain. FINDINGS: BRAIN AND VENTRICLES: No  acute hemorrhage. No evidence of acute infarct. No hydrocephalus. No extra-axial collection. No mass effect or midline shift. Atherosclerotic calcifications are present in the cavernous carotid arteries bilaterally. No hyperdense vessel is present. ORBITS: No acute abnormality. SINUSES: No acute abnormality. SOFT TISSUES AND SKULL: No acute soft tissue abnormality. No skull fracture. IMPRESSION: 1. No acute intracranial abnormality. 2. Atherosclerotic calcifications in the cavernous carotid arteries bilaterally. No hyperdense vessel. Electronically signed by: Lonni Necessary MD 12/05/2023 04:47 PM EDT RP Workstation: [id number]     Procedures   Medications Ordered in the ED  ondansetron  (ZOFRAN ) injection 4 mg (has no administration in time range)  lactated ringers bolus 1,000  mL (0 mLs Intravenous Stopped 12/05/23 1600)  magnesium  sulfate IVPB 2 g 50 mL (0 g Intravenous Stopped 12/05/23 1748)  lactated ringers bolus 500 mL (0 mLs Intravenous Stopped 12/05/23 1749)  iohexol (OMNIPAQUE) 350 MG/ML injection 75 mL (75 mLs Intravenous Contrast Given 12/05/23 1612)  lactated ringers bolus 500 mL (500 mLs Intravenous New Bag/Given 12/05/23 1749)  methocarbamol  (ROBAXIN ) tablet 1,000 mg (1,000 mg Oral Given 12/05/23 1802)                                    Medical Decision Making Amount and/or Complexity of Data Reviewed Labs: ordered. Radiology: ordered.  Risk Prescription drug management.   This patient presents to the ED for concern of multiple complaints, this involves an extensive number of treatment options, and is a complaint that carries with it a high risk of complications and morbidity.  The differential diagnosis includes dehydration, polypharmacy, infection, metabolic derangements   Co morbidities / Chronic conditions that complicate the patient evaluation  COPD, DM, fibromyalgia, HTN, HLD, CKD, GERD, anxiety, depression   Additional history obtained:  Additional  history obtained from EMR External records from outside source obtained and reviewed including N/A   Lab Tests:  I Ordered, and personally interpreted labs.  The pertinent results include: Creatinine and hemoglobin are elevated above baseline consistent with hemoconcentration and mild AKI.  No leukocytosis is present.  Hypomagnesemia with otherwise normal electrolytes.  There is mild hyperglycemia.  Troponin and BNP are normal.   Imaging Studies ordered:  I ordered imaging studies including CT of head, CTA chest, CT of abdomen and pelvis I independently visualized and interpreted imaging which showed no acute findings I agree with the radiologist interpretation   Cardiac Monitoring: / EKG:  The patient was maintained on a cardiac monitor.  I personally viewed and interpreted the cardiac monitored which showed an underlying rhythm of: Sinus rhythm   Problem List / ED Course / Critical interventions / Medication management  Patient presenting for generalized weakness in addition to decreased appetite, decreased activity, dizziness manipulation, nausea.  Symptoms have been ongoing for the past 3 days.  Vital signs on arrival are notable for tachycardia.  She is well-appearing on exam.  She has a small area of discomfort in the region of L1 but no other areas of pain.  Abdomen soft and nontender.  Breathing is unlabored.  Lungs are clear to auscultation.  CBG was 261.  IV fluids were ordered for hydration.  Patient was kept on monitor and workup was initiated.  Patient's lab work, creatinine is mildly elevated above baseline.  Additional IV fluids were ordered.  He is found to have hypomagnesemia on replacement magnesium  was ordered.  On imaging studies, no acute findings were identified.  Urinalysis is not consistent with UTI.  Patient did have improved symptoms after IV fluid hydration.  Her tachycardia has resolved.  Her blood pressure has improved.  I suspect dehydration secondary to  glucosuria.  In terms of her back pain, she states that this has been intermittent and ongoing for the past several months.  There were no imaging findings of concern in this area.  She states that she has lidocaine  patches at home and has been taking Flexeril , which makes her sleepy.  Dose of Robaxin  was trialed in the ED.  She was given some Zofran  for nausea.  She was able to tolerate p.o. intake.  Her EKG shows nonspecific  inferior ST segment elevation without reciprocal change, however, troponin is normal x 2.  Patient is stable for discharge. I ordered medication including IV fluids for hydration, magnesium  sulfate for hypomagnesemia, Zofran  for nausea, Robaxin  for back pain Reevaluation of the patient after these medicines showed that the patient improved I have reviewed the patients home medicines and have made adjustments as needed  Social Determinants of Health:  Lives independently     Final diagnoses:  Generalized weakness  Dehydration    ED Discharge Orders     None          Melvenia Motto, MD 12/05/23 SHAUNNA    Melvenia Motto, MD 12/05/23 1919

## 2023-12-05 NOTE — Discharge Instructions (Signed)
 Your test results today were overall reassuring.  Continue to drink plenty fluids to stay hydrated.  If your sugar becomes elevated, this can set you up for dehydration.  Talk with your primary care doctor about ongoing management of your diabetes.  Prescriptions for Zofran  and methocarbamol  were sent to your pharmacy.  Take Zofran  as needed for nausea.  Methocarbamol  is a muscle relaxer.  Take this as needed for back pain.  Avoid taking in combination with your Flexeril .  Return to the emergency department for any new or worsening symptoms of concern.

## 2023-12-07 ENCOUNTER — Other Ambulatory Visit (HOSPITAL_COMMUNITY): Payer: Self-pay

## 2023-12-09 ENCOUNTER — Other Ambulatory Visit: Payer: Self-pay

## 2023-12-15 ENCOUNTER — Other Ambulatory Visit: Payer: Self-pay | Admitting: Allergy

## 2023-12-15 ENCOUNTER — Other Ambulatory Visit: Payer: Self-pay | Admitting: Nurse Practitioner

## 2023-12-15 DIAGNOSIS — M797 Fibromyalgia: Secondary | ICD-10-CM

## 2023-12-15 DIAGNOSIS — E1143 Type 2 diabetes mellitus with diabetic autonomic (poly)neuropathy: Secondary | ICD-10-CM

## 2023-12-15 DIAGNOSIS — F419 Anxiety disorder, unspecified: Secondary | ICD-10-CM

## 2023-12-23 ENCOUNTER — Other Ambulatory Visit: Payer: Self-pay

## 2023-12-23 ENCOUNTER — Encounter: Payer: Self-pay | Admitting: Nurse Practitioner

## 2023-12-23 ENCOUNTER — Ambulatory Visit (INDEPENDENT_AMBULATORY_CARE_PROVIDER_SITE_OTHER): Admitting: Nurse Practitioner

## 2023-12-23 VITALS — BP 120/80 | HR 85 | Temp 98.9°F | Ht 60.0 in | Wt 128.0 lb

## 2023-12-23 DIAGNOSIS — Z0000 Encounter for general adult medical examination without abnormal findings: Secondary | ICD-10-CM

## 2023-12-23 DIAGNOSIS — N1831 Chronic kidney disease, stage 3a: Secondary | ICD-10-CM

## 2023-12-23 DIAGNOSIS — Z23 Encounter for immunization: Secondary | ICD-10-CM | POA: Diagnosis not present

## 2023-12-23 DIAGNOSIS — I119 Hypertensive heart disease without heart failure: Secondary | ICD-10-CM | POA: Diagnosis not present

## 2023-12-23 DIAGNOSIS — E782 Mixed hyperlipidemia: Secondary | ICD-10-CM

## 2023-12-23 DIAGNOSIS — J449 Chronic obstructive pulmonary disease, unspecified: Secondary | ICD-10-CM

## 2023-12-23 DIAGNOSIS — E1143 Type 2 diabetes mellitus with diabetic autonomic (poly)neuropathy: Secondary | ICD-10-CM

## 2023-12-23 DIAGNOSIS — I700 Atherosclerosis of aorta: Secondary | ICD-10-CM | POA: Diagnosis not present

## 2023-12-23 LAB — POCT URINALYSIS DIP (CLINITEK)
Bilirubin, UA: NEGATIVE
Blood, UA: NEGATIVE
Glucose, UA: NEGATIVE mg/dL
Ketones, POC UA: NEGATIVE mg/dL
Leukocytes, UA: NEGATIVE
Nitrite, UA: NEGATIVE
POC PROTEIN,UA: NEGATIVE
Spec Grav, UA: 1.01 (ref 1.010–1.025)
Urobilinogen, UA: 0.2 U/dL
pH, UA: 6 (ref 5.0–8.0)

## 2023-12-23 NOTE — Patient Instructions (Signed)
 Health Maintenance  Topic Date Due   Eye exam for diabetics  10/04/2022   Complete foot exam   05/28/2023   Zoster (Shingles) Vaccine (2 of 2) 08/13/2023   Flu Shot  09/25/2023   Hemoglobin A1C  11/13/2023   COVID-19 Vaccine (1) 05/27/2024*   Yearly kidney health urinalysis for diabetes  05/12/2024   Screening for Lung Cancer  12/04/2024   Yearly kidney function blood test for diabetes  12/04/2024   Breast Cancer Screening  06/28/2025   Colon Cancer Screening  09/22/2027   DTaP/Tdap/Td vaccine (3 - Td or Tdap) 02/09/2032   Pneumococcal Vaccine for age over 61  Completed   Hepatitis C Screening  Completed   HIV Screening  Completed   Hepatitis B Vaccine  Aged Out   HPV Vaccine  Aged Out   Meningitis B Vaccine  Aged Out  *Topic was postponed. The date shown is not the original due date.

## 2023-12-23 NOTE — Progress Notes (Signed)
 LILLETTE Kristeen JINNY Gladis, CMA,acting as a neurosurgeon for Gaines Ada, [name].,have documented all relevant documentation on the behalf of Gaines Ada, [name],as directed by  Gaines Ada, [name] while in the presence of Gaines Ada, [name].  Subjective:    Patient ID: [name] , female    DOB: January 02, 1961 , 63 y.o.   MRN: [id number]  Chief Complaint  Patient presents with   Annual Exam    Patient presents today for HM, Patient reports compliance with medication. Patient denies any chest pain, SOB, or headaches. Patient has no concerns today.    Discussed the use of AI scribe software for clinical note transcription with the patient, who gave verbal consent to proceed.  History of Present Illness [name] is a 63 year old female with emphysema and diabetes who presents for an annual physical exam.  She recently visited the emergency room approximately three weeks to a month ago due to dehydration and low magnesium  levels. During this visit, she experienced low blood pressure, recorded at 100/71 mmHg, but no clear reason for the drop was identified. She tries to stay hydrated but admits to not drinking as much water as recommended, especially after her brother's passing. Her fluid intake is mostly in the mornings, estimating about half a liter per day.  She has a history of diabetes with a recent A1c of 8.4. She is currently under the care of an endocrinologist at [facility] for diabetes management. During her last ER visit, her blood sugar was extremely high despite not eating much.  For her emphysema, she uses both an inhaler and a nebulizer, although her current nebulizer is broken and she is awaiting a replacement. She reports smoking less than before.  She experiences occasional difficulty swallowing, particularly with pills or when she feels she lacks saliva. The patient reports having a chest x-ray in the spring and was told it was okay.  Her physical activity includes exercising two to  three times a week with her granddaughter, participating in a 'boots on the ground' contest. She describes her diet as 'half and half,' noting a metallic taste with some foods that deters her from eating them.  She checks her feet daily due to her diabetes and does not walk barefoot often due to cold floors.   She has seen the Pulmonologist, no changes.   Hypertension This is a chronic problem. The current episode started more than 1 year ago. The problem has been gradually improving since onset. The problem is controlled. Pertinent negatives include no chest pain, headaches, palpitations or shortness of breath. There are no associated agents to hypertension. Risk factors for coronary artery disease include diabetes mellitus, family history, obesity, smoking/tobacco exposure and stress. Past treatments include calcium  channel blockers and ACE inhibitors. The current treatment provides significant improvement. There are no compliance problems.  Hypertensive end-organ damage includes kidney disease and PVD. Identifiable causes of hypertension include chronic renal disease and a hypertension causing med.  Diabetes She presents for her follow-up diabetic visit. She has type 2 diabetes mellitus. No MedicAlert identification noted. Her disease course has been stable. There are no hypoglycemic associated symptoms. Pertinent negatives for hypoglycemia include no dizziness or headaches. There are no diabetic associated symptoms. Pertinent negatives for diabetes include no chest pain, no fatigue, no polydipsia, no polyphagia and no polyuria. There are no hypoglycemic complications. Symptoms are stable. Diabetic complications include nephropathy, peripheral neuropathy and PVD. Risk factors for coronary artery disease include diabetes mellitus, hypertension, stress, dyslipidemia, family history,  obesity and tobacco exposure. Current diabetic treatment includes diet and oral agent (dual therapy). She is compliant with  treatment all of the time. Her weight is stable. She is following a low salt, generally healthy and diabetic diet. Meal planning includes avoidance of concentrated sweets. She participates in exercise three times a week. An ACE inhibitor/angiotensin II receptor blocker is being taken. She does not see a podiatrist.Eye exam is not current.     Past Medical History:  Diagnosis Date   Acute kidney failure with lesion of tubular necrosis 11/11/2016   Acute kidney injury superimposed on chronic kidney disease 05/01/2021   Anxiety    Back pain    Dehydration 07/19/2017   Diabetes mellitus    Dyspnea 07/20/2017   Epistaxis 04/28/2018   Essential hypertension 11/11/2016   Fibromyalgia    High cholesterol    Hypertension    Hypomagnesemia 07/20/2017   Pancreatitis 07/19/2017   Periumbilical pain 05/27/2022   STRESS FRACTURE, FOOT 05/25/2008   Qualifier: Diagnosis of   By: Benjamine MD, Beverley      Replacing diagnoses that were inactivated after the 05/26/22 regulatory import     Syncope 11/11/2016     Family History  Problem Relation Age of Onset   Allergic rhinitis Mother    Hypertension Mother    Heart failure Mother    Allergic rhinitis Father    Diabetes Father    COPD Father    Emphysema Father    Heart failure Maternal Grandmother    Cancer Maternal Grandfather        LUNG    Heart disease Maternal Grandfather    Lung cancer Maternal Grandfather    Bronchitis Daughter        TWICE YEARLY   Allergic rhinitis Daughter    Bronchitis Son        YEARLY   Allergic rhinitis Son    Food Allergy Grandchild    Asthma Neg Hx    Immunodeficiency Neg Hx    Eczema Neg Hx    Atopy Neg Hx    Angioedema Neg Hx      Current Outpatient Medications:    acetaminophen  (TYLENOL ) 500 MG tablet, Take 1,000 mg by mouth every 6 (six) hours as needed for moderate pain or headache., Disp: , Rfl:    albuterol  (PROVENTIL ) (2.5 MG/3ML) 0.083% nebulizer solution, Take 3 mLs (2.5 mg total) by  nebulization every 4 (four) hours as needed for wheezing or shortness of breath (coughing fits)., Disp: 75 mL, Rfl: 1   albuterol  (VENTOLIN  HFA) 108 (90 Base) MCG/ACT inhaler, INHALE TWO PUFFS BY MOUTH EVERY 4 HOURS AS NEEDED FOR WHEEZING OR SHORTNESS OF BREATH (COUGHING FITS), Disp: 1 each, Rfl: 1   alum & mag hydroxide-simeth (MAALOX/MYLANTA) 200-200-20 MG/5ML suspension, Take 15 mLs by mouth every 4 (four) hours as needed for indigestion or heartburn., Disp: 355 mL, Rfl: 0   aspirin  81 MG chewable tablet, Chew 81 mg by mouth daily., Disp: , Rfl:    benralizumab  (FASENRA ) 30 MG/ML prefilled syringe, Inject 1 mL (30 mg total) into the skin every 8 (eight) weeks., Disp: 1 mL, Rfl: 6   benralizumab  (FASENRA ) 30 MG/ML prefilled syringe, Inject 1 mL (30 mg total) into the skin every 28 (twenty-eight) days., Disp: 1 mL, Rfl: 2   benzonatate  (TESSALON  PERLES) 100 MG capsule, Take 1 capsule (100 mg total) by mouth 3 (three) times daily as needed for cough., Disp: 30 capsule, Rfl: 0   Blood Glucose Monitoring Suppl KIT, 1 each  by Does not apply route 4 (four) times daily., Disp: 1 kit, Rfl: 0   Blood Pressure Monitoring (BLOOD PRESSURE CUFF) MISC, Use to check blood pressure twice a day., Disp: 1 each, Rfl: 1   budesonide  (PULMICORT ) 0.5 MG/2ML nebulizer solution, Take 2 mLs (0.5 mg total) by nebulization 2 (two) times daily as needed., Disp: 120 mL, Rfl: 3   budesonide -glycopyrrolate-formoterol  (BREZTRI  AEROSPHERE) 160-9-4.8 MCG/ACT AERO inhaler, Inhale 2 puffs into the lungs in the morning and at bedtime. with spacer and rinse mouth afterwards., Disp: 10.7 g, Rfl: 5   busPIRone  (BUSPAR ) 10 MG tablet, Take 1 tablet (10 mg total) by mouth daily., Disp: 90 tablet, Rfl: 1   CRESTOR  40 MG tablet, TAKE ONE TABLET BY MOUTH EVERY DAY, Disp: 90 tablet, Rfl: 1   DULoxetine  (CYMBALTA ) 60 MG capsule, Take 1 capsule (60 mg total) by mouth daily., Disp: 90 capsule, Rfl: 1   EPINEPHrine  0.3 mg/0.3 mL IJ SOAJ injection,  Inject 0.3 mg into the muscle as needed for anaphylaxis., Disp: 2 each, Rfl: 1   ezetimibe  (ZETIA ) 10 MG tablet, TAKE ONE TABLET BY MOUTH EVERY DAY, Disp: 90 tablet, Rfl: 2   famotidine  (PEPCID ) 20 MG tablet, Take 1 tablet (20 mg total) by mouth daily. For heartburn, Disp: 30 tablet, Rfl: 5   fluconazole  (DIFLUCAN ) 100 MG tablet, Take 1 tablet (100 mg total) by mouth daily. Take 1 tablet by mouth now repeat in 5 days, Disp: 2 tablet, Rfl: 0   gabapentin  (NEURONTIN ) 300 MG capsule, Take 1 capsule (300 mg total) by mouth 2 (two) times daily., Disp: 180 capsule, Rfl: 1   lidocaine  (LIDODERM ) 5 %, APPLY 1 PATCH TO AFFECTED AREA EVERY DAY AS NEEDED FOR BACK PAIN. REMOVE AND DISCARD PATCH WITHIN 12 HOURS OR AS DIRECTED BY MD. (12 HOURS ON-12 HOURS OFF), Disp: 30 patch, Rfl: 1   loperamide  (IMODIUM ) 2 MG capsule, Take 1 capsule (2 mg total) by mouth every 6 (six) hours as needed for diarrhea or loose stools., Disp: 30 capsule, Rfl: 0   Melatonin 5 MG TABS, Take 5 mg by mouth at bedtime., Disp: , Rfl:    metFORMIN  (GLUCOPHAGE -XR) 750 MG 24 hr tablet, Take 1 tablet by mouth daily, Disp: 90 tablet, Rfl: 1   methocarbamol  (ROBAXIN ) 500 MG tablet, Take 1 tablet (500 mg total) by mouth every 8 (eight) hours as needed for muscle spasms., Disp: 20 tablet, Rfl: 0   montelukast  (SINGULAIR ) 10 MG tablet, Take 1 tablet (10 mg total) by mouth at bedtime., Disp: 90 tablet, Rfl: 3   Multiple Vitamins-Minerals (ONE-A-DAY WOMENS PO), Take 1 tablet by mouth daily., Disp: , Rfl:    olmesartan  (BENICAR ) 5 MG tablet, TAKE ONE TABLET BY MOUTH EVERY DAY, Disp: 90 tablet, Rfl: 1   ondansetron  (ZOFRAN -ODT) 4 MG disintegrating tablet, Take 1 tablet (4 mg total) by mouth every 8 (eight) hours as needed for nausea or vomiting., Disp: 20 tablet, Rfl: 0   pantoprazole  (PROTONIX ) 40 MG tablet, TAKE ONE TABLET BY MOUTH EVERY DAY 30 TO 60 MINUTES BEFORE FIRST MEAL OF THE DAY, Disp: 90 tablet, Rfl: 1   sitaGLIPtin  (JANUVIA ) 100 MG tablet,  Take 1 tablet (100 mg total) by mouth daily., Disp: 90 tablet, Rfl: 1   Tetrahydrozoline HCl (VISINE OP), Place 1 drop into both eyes daily as needed (dry/itchy eyes)., Disp: , Rfl:    Vitamin D , Ergocalciferol , (DRISDOL ) 1.25 MG (50000 UNIT) CAPS capsule, TAKE ONE CAPSULE BY MOUTH EVERY SEVEN DAYS (MAY CONTAIN SOY OR  PEANUT--TALK TO YOUR DOCTOR BEFORE TAKING IF YOU ARE ALLERGIC), Disp: 12 capsule, Rfl: 1   Allergies  Allergen Reactions   Tylenol  [Acetaminophen ] Itching    With codiene   Pollen Extract    Shellfish Allergy Hives   Peanut-Containing Drug Products Hives      The patient states she uses status post hysterectomy for birth control. No LMP recorded. Patient has had a hysterectomy.  Negative for: breast discharge, breast lump(s), breast pain and breast self exam. Associated symptoms include abnormal vaginal bleeding. Pertinent negatives include abnormal bleeding (hematology), anxiety, decreased libido, depression, difficulty falling sleep, dyspareunia, history of infertility, nocturia, sexual dysfunction, sleep disturbances, urinary incontinence, urinary urgency, vaginal discharge and vaginal itching. Diet regular.  The patient states her exercise level is moderate with dancing.   The patient's tobacco use is:  Social History   Tobacco Use  Smoking Status Every Day   Current packs/day: 1.00   Average packs/day: 1 pack/day for 45.0 years (45.0 ttl pk-yrs)   Types: Cigarettes  Smokeless Tobacco Never  Tobacco Comments   1 pack/ day months, 01/23/22 - 1/2 PPD. She has cut down to 5 cigarettes a day - 05/28/2022  She has been exposed to passive smoke. The patient's alcohol use is:  Social History   Substance and Sexual Activity  Alcohol Use Yes   Alcohol/week: 0.0 standard drinks of alcohol   Comment: rare    Review of Systems  Constitutional:  Negative for fatigue and fever.  HENT: Negative.    Eyes: Negative.   Respiratory: Negative.  Negative for shortness of breath.    Cardiovascular:  Negative for chest pain, palpitations and leg swelling.  Gastrointestinal: Negative.   Endocrine: Negative for polydipsia, polyphagia and polyuria.  Genitourinary: Negative.   Musculoskeletal: Negative.   Skin: Negative.   Neurological:  Negative for dizziness and headaches.  Hematological: Negative.   Psychiatric/Behavioral: Negative.       Today's Vitals   12/23/23 0831  BP: 120/80  Pulse: 85  Temp: 98.9 F (37.2 C)  TempSrc: Oral  Weight: 128 lb (58.1 kg)  Height: 5' (1.524 m)  PainSc: 0-No pain   Body mass index is 25 kg/m.  Wt Readings from Last 3 Encounters:  12/23/23 128 lb (58.1 kg)  12/05/23 130 lb (59 kg)  06/18/23 136 lb 6.4 oz (61.9 kg)     Objective:  Physical Exam Vitals and nursing note reviewed.  Constitutional:      General: She is not in acute distress.    Appearance: Normal appearance. She is well-developed. She is obese. She is not toxic-appearing.  HENT:     Head: Normocephalic and atraumatic.     Right Ear: Hearing, tympanic membrane, ear canal and external ear normal. There is no impacted cerumen.     Left Ear: Hearing, tympanic membrane, ear canal and external ear normal. There is no impacted cerumen.     Nose: Nose normal.     Mouth/Throat:     Mouth: Mucous membranes are moist.  Eyes:     General: Lids are normal.     Extraocular Movements: Extraocular movements intact.     Conjunctiva/sclera: Conjunctivae normal.     Pupils: Pupils are equal, round, and reactive to light.     Funduscopic exam:    Right eye: No papilledema.        Left eye: No papilledema.  Neck:     Thyroid: No thyroid mass.     Vascular: No carotid bruit.  Cardiovascular:  Rate and Rhythm: Normal rate and regular rhythm.     Pulses: Normal pulses.     Heart sounds: Normal heart sounds. No murmur heard. Pulmonary:     Effort: Pulmonary effort is normal. No respiratory distress.     Breath sounds: Normal breath sounds. No wheezing.  Chest:      Chest wall: No mass.  Breasts:    Tanner Score is 5.     Right: Normal. No mass or tenderness.     Left: Normal. No mass or tenderness.  Abdominal:     General: Abdomen is flat. Bowel sounds are normal. There is no distension.     Palpations: Abdomen is soft.     Tenderness: There is no abdominal tenderness.  Genitourinary:    Rectum: Guaiac result negative.  Musculoskeletal:        General: No swelling or tenderness. Normal range of motion.     Cervical back: Full passive range of motion without pain, normal range of motion and neck supple.     Right lower leg: No edema.     Left lower leg: No edema.  Lymphadenopathy:     Upper Body:     Right upper body: No supraclavicular, axillary or pectoral adenopathy.     Left upper body: No supraclavicular, axillary or pectoral adenopathy.  Skin:    General: Skin is warm and dry.     Capillary Refill: Capillary refill takes less than 2 seconds.  Neurological:     General: No focal deficit present.     Mental Status: She is alert and oriented to person, place, and time.     Cranial Nerves: No cranial nerve deficit.     Sensory: No sensory deficit.  Psychiatric:        Mood and Affect: Mood normal.        Behavior: Behavior normal.        Thought Content: Thought content normal.        Judgment: Judgment normal.      Diabetic foot exam was performed with the following findings:   No deformities, ulcerations, or other skin breakdown Normal sensation of 10g monofilament Intact posterior tibialis and dorsalis pedis pulses Decreased sensation bilateral feet        Assessment And Plan:     Encounter for annual health examination Assessment & Plan: Routine adult wellness visit. Discussed hydration, exercise, and dietary habits. - Administer flu shot. - Encourage drinking at least 64 ounces of water daily. - Encourage exercise 2-3 times per week.   Type II diabetes mellitus with peripheral autonomic neuropathy (HCC) Assessment  & Plan: Type 2 diabetes with diabetic autonomic neuropathy. A1c elevated at 8.4. Reports seeing endocrinologist. Neuropathy symptoms noted. - Recheck A1c. - Avoid walking barefoot.  Orders: -     POCT URINALYSIS DIP (CLINITEK) -     Microalbumin / creatinine urine ratio -     Hemoglobin A1c -     Basic metabolic panel with GFR  Hypertensive heart disease without heart failure -     POCT URINALYSIS DIP (CLINITEK) -     Microalbumin / creatinine urine ratio -     Basic metabolic panel with GFR  Stage 3a chronic kidney disease (HCC) Assessment & Plan: Encouraged to stay well hydrated with water and avoid NSAIDs   Aortic atherosclerosis Assessment & Plan: Continue statin, tolerating well   Hypomagnesemia -     Magnesium   Mixed hyperlipidemia Assessment & Plan: Cholesterol levels are stable.  Continue statin, tolerating  well.  Orders: -     Lipid panel  Need for influenza vaccination Assessment & Plan: Influenza vaccine administered Encouraged to take Tylenol  as needed for fever or muscle aches.   Orders: -     Flu vaccine trivalent PF, 6mos and older(Flulaval,Afluria,Fluarix,Fluzone)  COPD 0 stage with emphysema  on CT/ actively smoking  Assessment & Plan: COPD with emphysema. Reports using inhaler and nebulizer, but nebulizer is broken. Continues to smoke, though less than before. Continue f/u with Pulmonology     Return for 1 year physical, controlled BP check 6 months; schedule shingrix  in 2-3 weeks. Patient was given opportunity to ask questions. Patient verbalized understanding of the plan and was able to repeat key elements of the plan. All questions were answered to their satisfaction.   Gaines Ada, [name]  I, Gaines Ada, [name], have reviewed all documentation for this visit. The documentation on 12/23/23 for the exam, diagnosis, procedures, and orders are all accurate and complete.

## 2023-12-24 LAB — LIPID PANEL
Chol/HDL Ratio: 2 ratio (ref 0.0–4.4)
Cholesterol, Total: 125 mg/dL (ref 100–199)
HDL: 63 mg/dL (ref >39)
LDL Chol Calc (NIH): 45 mg/dL (ref 0–99)
Triglycerides: 87 mg/dL (ref 0–149)
VLDL Cholesterol Cal: 17 mg/dL (ref 5–40)

## 2023-12-24 LAB — HEMOGLOBIN A1C
Est. average glucose Bld gHb Est-mCnc: 140 mg/dL
Hgb A1c MFr Bld: 6.5 % — ABNORMAL HIGH (ref 4.8–5.6)

## 2023-12-24 LAB — MICROALBUMIN / CREATININE URINE RATIO
Creatinine, Urine: 48.9 mg/dL
Microalb/Creat Ratio: 30 mg/g{creat} — ABNORMAL HIGH (ref 0–29)
Microalbumin, Urine: 14.7 ug/mL

## 2023-12-24 LAB — MAGNESIUM: Magnesium: 1.9 mg/dL (ref 1.6–2.3)

## 2023-12-24 LAB — BASIC METABOLIC PANEL WITH GFR
BUN/Creatinine Ratio: 12 (ref 12–28)
BUN: 12 mg/dL (ref 8–27)
CO2: 22 mmol/L (ref 20–29)
Calcium: 9.6 mg/dL (ref 8.7–10.3)
Chloride: 100 mmol/L (ref 96–106)
Creatinine, Ser: 0.99 mg/dL (ref 0.57–1.00)
Glucose: 146 mg/dL — ABNORMAL HIGH (ref 70–99)
Potassium: 4.1 mmol/L (ref 3.5–5.2)
Sodium: 139 mmol/L (ref 134–144)
eGFR: 64 mL/min/1.73 (ref >59)

## 2023-12-28 ENCOUNTER — Other Ambulatory Visit: Payer: Self-pay | Admitting: Nurse Practitioner

## 2023-12-28 DIAGNOSIS — R252 Cramp and spasm: Secondary | ICD-10-CM

## 2023-12-29 ENCOUNTER — Ambulatory Visit

## 2023-12-30 ENCOUNTER — Ambulatory Visit: Payer: Self-pay | Admitting: Nurse Practitioner

## 2023-12-30 ENCOUNTER — Encounter: Payer: Self-pay | Admitting: Nurse Practitioner

## 2023-12-30 DIAGNOSIS — Z0000 Encounter for general adult medical examination without abnormal findings: Secondary | ICD-10-CM | POA: Insufficient documentation

## 2023-12-30 NOTE — Assessment & Plan Note (Signed)
Cholesterol levels are stable.  Continue statin, tolerating well.

## 2023-12-30 NOTE — Assessment & Plan Note (Signed)
 Influenza vaccine administered Encouraged to take Tylenol as needed for fever or muscle aches.

## 2023-12-30 NOTE — Assessment & Plan Note (Addendum)
 COPD with emphysema. Reports using inhaler and nebulizer, but nebulizer is broken. Continues to smoke, though less than before. Continue f/u with Pulmonology

## 2023-12-30 NOTE — Assessment & Plan Note (Signed)
 Continue statin, tolerating well

## 2023-12-30 NOTE — Assessment & Plan Note (Signed)
 Routine adult wellness visit. Discussed hydration, exercise, and dietary habits. - Administer flu shot. - Encourage drinking at least 64 ounces of water daily. - Encourage exercise 2-3 times per week.

## 2023-12-30 NOTE — Assessment & Plan Note (Signed)
 Type 2 diabetes with diabetic autonomic neuropathy. A1c elevated at 8.4. Reports seeing endocrinologist. Neuropathy symptoms noted. - Recheck A1c. - Avoid walking barefoot.

## 2023-12-30 NOTE — Assessment & Plan Note (Signed)
 Encouraged to stay well hydrated with water and avoid NSAIDs

## 2023-12-31 ENCOUNTER — Telehealth: Payer: Self-pay | Admitting: Allergy

## 2023-12-31 MED ORDER — NEBULIZER/TUBING/MOUTHPIECE KIT: PACK | 0 refills | Status: AC

## 2023-12-31 NOTE — Telephone Encounter (Signed)
 [name] states she called several weeks ago about getting a nebulizer but nothing was documented.  [name] would like a new nebulizer because her old one stopped working and [employer] she has had it longer than 3 years.  [name] provided [facility] info  AttnBETHA Harris Fax: [phone]  Please fax order immediately.    [name] would like a phone call to follow up on nebulizer.

## 2023-12-31 NOTE — Telephone Encounter (Signed)
 Please fax to [facility]. AttnBETHA Perkins Fax: [phone]  And let patient know that it was faxed. ty

## 2023-12-31 NOTE — Telephone Encounter (Signed)
 Called Pt and left voice mail for faxing order to [facility].

## 2024-01-04 NOTE — Telephone Encounter (Signed)
 I called and left a detailed message per DPR.

## 2024-01-12 ENCOUNTER — Ambulatory Visit (INDEPENDENT_AMBULATORY_CARE_PROVIDER_SITE_OTHER)

## 2024-01-12 VITALS — BP 110/70 | HR 85 | Temp 98.0°F | Ht 60.0 in | Wt 124.0 lb

## 2024-01-12 DIAGNOSIS — Z23 Encounter for immunization: Secondary | ICD-10-CM

## 2024-01-12 NOTE — Progress Notes (Signed)
 Patient is in office today for a nurse visit for Shingles 2nd Vaccine Immunization. Patient Injection was given in the  Right deltoid. Patient tolerated injection well.

## 2024-01-12 NOTE — Patient Instructions (Signed)
 Shingles  Shingles, or herpes zoster, is an infection. It gives you a skin rash and blisters. These infected areas may hurt a lot. Shingles only happens if: You've had chickenpox. You've been given a shot called a vaccine to protect you from getting chickenpox. Shingles is rare in this case. What are the causes? Shingles is caused by a germ called the varicella-zoster virus. This is the same germ that causes chickenpox. After you're exposed to the germ, it stays in your body but is dormant. This means it isn't active. Shingles happens if the germ becomes active again. This can happen years after you're first exposed to the germ. What increases the risk? You may be more likely to get shingles if: You're older than 64 years of age. You're under a lot of stress. You have a weak immune system. The immune system is your body's defense system. It may be weak if: You have human immunodeficiency virus (HIV). You have acquired immunodeficiency syndrome (AIDS). You have cancer. You take medicines that weaken your immune system. These include organ transplant medicines. What are the signs or symptoms? The first symptoms of shingles may be itching, tingling, or pain. Your skin may feel like it's burning. A few days or weeks later, you'll get a rash. Here's what you can expect: The rash is likely to be on one side of your body. The rash may be shaped like a belt or a band. Over time, it will turn into blisters filled with fluid. The blisters will break open and change into scabs. The scabs will dry up in about 2-3 weeks. You may also have: A fever. Chills. A headache. Nausea. How is this diagnosed? Shingles is diagnosed with a skin exam. A sample called a culture may be taken from one of your blisters and sent to a lab. This will show if you have shingles. How is this treated? The rash may last for several weeks. There's no cure for shingles, but your health care provider may give you medicines.  These medicines may: Help with pain. Help with itching. Help with irritation and swelling. Help you get better sooner. Help to prevent long-term problems. If the rash is on your face, you may need to see an eye doctor or an ear, nose, and throat (ENT) doctor. Follow these instructions at home: Medicines Take your medicines only as told by your provider. Put an anti-itch cream or numbing cream on the rash or blisters as told by your provider. Relieving itching and discomfort  To help with itching: Put cold, wet cloths called cold compresses on the rash or blisters. Take a cool bath. Try adding baking soda or dry oatmeal to the water. Do not bathe in hot water. Use calamine lotion on the rash or blisters. You can get this type of lotion at the store. Blister and rash care Keep your rash covered with a loose bandage. Wear loose clothes that don't rub on your rash. Take care of your rash as told by your provider. Make sure you: Wash your hands with soap and water for at least 20 seconds before and after you change your bandage. If you can't use soap and water, use hand sanitizer. Keep your rash and blisters clean by washing them with mild soap and cool water. Change your bandage. Check your rash every day for signs of infection. Check for: More redness, swelling, or pain. Fluid or blood. Warmth. Pus or a bad smell. Do not scratch your rash. Do not pick at your  blisters. To help you not scratch: Keep your fingernails clean and cut short. Try to wear gloves or mittens when you sleep. General instructions Rest. Wash your hands often with soap and water for at least 20 seconds. If you can't use soap and water, use hand sanitizer. Washing your hands lowers your chance of getting a skin infection. Your infection can cause chickenpox in others. If you have blisters that aren't scabs yet, stay away from: Babies. Pregnant people. Children who have eczema. Older people who have organ  transplants. People who have a long-term, or chronic, illness. Anyone who hasn't had chickenpox before. Anyone who hasn't gotten the chickenpox vaccine. How is this prevented? Vaccines are the best way to prevent you from getting chickenpox or shingles. Talk with your provider about getting these shots. Where to find more information Centers for Disease Control and Prevention (CDC): [employer] Contact a health care provider if: Your pain doesn't get better with medicine. Your pain doesn't get better after the rash heals. You have any signs of infection around the rash. Your rash or blisters get worse. You have a fever or chills. Get help right away if: The rash is on your face or nose. You have pain in your face or by your eye. You lose feeling on one side of your face. You have trouble seeing. You have ear pain or ringing in your ear. This information is not intended to replace advice given to you by your health care provider. Make sure you discuss any questions you have with your health care provider. Document Revised: 11/13/2022 Document Reviewed: 03/28/2022 Elsevier Patient Education  2024 [employer].

## 2024-01-14 ENCOUNTER — Other Ambulatory Visit (HOSPITAL_COMMUNITY): Payer: Self-pay

## 2024-01-26 ENCOUNTER — Other Ambulatory Visit: Payer: Self-pay

## 2024-01-26 ENCOUNTER — Ambulatory Visit

## 2024-01-26 DIAGNOSIS — J4550 Severe persistent asthma, uncomplicated: Secondary | ICD-10-CM

## 2024-01-26 MED ORDER — BENRALIZUMAB 30 MG/ML ~~LOC~~ SOSY
30.0000 mg | PREFILLED_SYRINGE | SUBCUTANEOUS | Status: AC
  Administered 2024-01-26: 30 mg via SUBCUTANEOUS

## 2024-02-12 ENCOUNTER — Other Ambulatory Visit (HOSPITAL_COMMUNITY): Payer: Self-pay

## 2024-02-17 ENCOUNTER — Telehealth: Payer: Self-pay | Admitting: Nurse Practitioner

## 2024-02-17 ENCOUNTER — Other Ambulatory Visit: Payer: Self-pay

## 2024-02-17 DIAGNOSIS — M797 Fibromyalgia: Secondary | ICD-10-CM

## 2024-02-17 MED ORDER — GABAPENTIN 300 MG PO CAPS: 300.0000 mg | ORAL_CAPSULE | Freq: Two times a day (BID) | ORAL | 1 refills | Status: AC

## 2024-02-17 NOTE — Telephone Encounter (Unsigned)
 Copied from CRM [id number]. Topic: Clinical - Medication Refill >> Feb 17, 2024 10:36 AM Donna E wrote: Medication:  cyclobenzaprine  (FLEXERIL ) 10 mg tablet TAKE 1 TABLET BY MOUTH THREE TIMES DAILY   Patient spoke with [facility], who stated have provider prescribe for a 30 or 90 day supply    Has the patient contacted their pharmacy? Yes  This is the patient's preferred pharmacy:   [facility] - [place], [place] - [address] [place] [address] [place] [address] Phone: [phone] Fax: [phone]   Is this the correct pharmacy for this prescription? Yes If no, delete pharmacy and type the correct one.   Has the prescription been filled recently? Yes  Is the patient out of the medication? No  Has the patient been seen for an appointment in the last year OR does the patient have an upcoming appointment? Yes  Can we respond through MyChart? No  Agent: Please be advised that Rx refills may take up to 3 business days. We ask that you follow-up with your pharmacy.

## 2024-02-23 ENCOUNTER — Other Ambulatory Visit: Payer: Self-pay

## 2024-02-23 ENCOUNTER — Other Ambulatory Visit (HOSPITAL_COMMUNITY): Payer: Self-pay

## 2024-02-23 ENCOUNTER — Ambulatory Visit

## 2024-02-23 NOTE — Progress Notes (Signed)
 Specialty Pharmacy Refill Coordination Note  [name] is a 63 y.o. female contacted today regarding refills of specialty medication(s) Benralizumab  (FASENRA )   Patient requested Courier to Provider Office   Delivery date: 02/29/24   Verified address: [address] [place] [address]   Medication will be filled on: 02/26/24

## 2024-03-01 ENCOUNTER — Other Ambulatory Visit: Payer: Self-pay

## 2024-03-18 ENCOUNTER — Telehealth: Payer: Self-pay | Admitting: Allergy

## 2024-03-18 ENCOUNTER — Other Ambulatory Visit: Payer: Self-pay

## 2024-03-18 NOTE — Telephone Encounter (Signed)
 Left voicemail to give the office a call back to schedule [facility] reapproval appointment.

## 2024-03-22 ENCOUNTER — Other Ambulatory Visit: Payer: Self-pay

## 2024-03-24 ENCOUNTER — Other Ambulatory Visit: Payer: Self-pay

## 2024-03-31 ENCOUNTER — Other Ambulatory Visit (HOSPITAL_COMMUNITY): Payer: Self-pay

## 2024-06-22 ENCOUNTER — Ambulatory Visit: Payer: Self-pay | Admitting: Nurse Practitioner

## 2024-12-26 ENCOUNTER — Encounter: Payer: Self-pay | Admitting: Nurse Practitioner
